# Patient Record
Sex: Female | Born: 1980 | Hispanic: No | Smoking: Never smoker
Health system: Southern US, Community
[De-identification: ages and names within clinical notes are randomized; demographics above are authoritative.]

## PROBLEM LIST (undated history)

## (undated) DIAGNOSIS — M1990 Unspecified osteoarthritis, unspecified site: Secondary | ICD-10-CM

## (undated) DIAGNOSIS — O903 Peripartum cardiomyopathy: Secondary | ICD-10-CM

## (undated) DIAGNOSIS — M549 Dorsalgia, unspecified: Secondary | ICD-10-CM

## (undated) DIAGNOSIS — O139 Gestational [pregnancy-induced] hypertension without significant proteinuria, unspecified trimester: Secondary | ICD-10-CM

## (undated) DIAGNOSIS — G8929 Other chronic pain: Secondary | ICD-10-CM

## (undated) DIAGNOSIS — G43909 Migraine, unspecified, not intractable, without status migrainosus: Secondary | ICD-10-CM

## (undated) DIAGNOSIS — K219 Gastro-esophageal reflux disease without esophagitis: Secondary | ICD-10-CM

## (undated) DIAGNOSIS — R110 Nausea: Secondary | ICD-10-CM

## (undated) DIAGNOSIS — K8020 Calculus of gallbladder without cholecystitis without obstruction: Secondary | ICD-10-CM

## (undated) DIAGNOSIS — Z8669 Personal history of other diseases of the nervous system and sense organs: Secondary | ICD-10-CM

## (undated) DIAGNOSIS — G5601 Carpal tunnel syndrome, right upper limb: Secondary | ICD-10-CM

## (undated) HISTORY — DX: Unspecified osteoarthritis, unspecified site: M19.90

## (undated) HISTORY — DX: Migraine, unspecified, not intractable, without status migrainosus: G43.909

## (undated) HISTORY — DX: Gestational (pregnancy-induced) hypertension without significant proteinuria, unspecified trimester: O13.9

## (undated) HISTORY — DX: Morbid (severe) obesity due to excess calories: E66.01

## (undated) HISTORY — DX: Calculus of gallbladder without cholecystitis without obstruction: K80.20

## (undated) HISTORY — DX: Peripartum cardiomyopathy: O90.3

---
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
## 2001-02-05 ENCOUNTER — Inpatient Hospital Stay (HOSPITAL_COMMUNITY): Admission: AD | Admit: 2001-02-05 | Discharge: 2001-02-08 | Payer: Self-pay | Admitting: *Deleted

## 2001-09-25 ENCOUNTER — Emergency Department (HOSPITAL_COMMUNITY): Admission: EM | Admit: 2001-09-25 | Discharge: 2001-09-25 | Payer: Self-pay | Admitting: Emergency Medicine

## 2003-12-30 ENCOUNTER — Emergency Department: Payer: Self-pay | Admitting: Internal Medicine

## 2003-12-30 ENCOUNTER — Other Ambulatory Visit: Payer: Self-pay

## 2003-12-31 ENCOUNTER — Ambulatory Visit: Payer: Self-pay | Admitting: Internal Medicine

## 2004-07-22 ENCOUNTER — Emergency Department: Payer: Self-pay | Admitting: Unknown Physician Specialty

## 2004-07-22 ENCOUNTER — Other Ambulatory Visit: Payer: Self-pay

## 2004-07-25 ENCOUNTER — Ambulatory Visit: Payer: Self-pay | Admitting: Unknown Physician Specialty

## 2004-09-10 ENCOUNTER — Emergency Department: Payer: Self-pay | Admitting: Emergency Medicine

## 2005-04-13 ENCOUNTER — Observation Stay: Payer: Self-pay

## 2005-04-15 ENCOUNTER — Inpatient Hospital Stay: Payer: Self-pay

## 2005-05-19 ENCOUNTER — Emergency Department (HOSPITAL_COMMUNITY): Admission: EM | Admit: 2005-05-19 | Discharge: 2005-05-20 | Payer: Self-pay | Admitting: Emergency Medicine

## 2006-03-13 ENCOUNTER — Emergency Department (HOSPITAL_COMMUNITY): Admission: EM | Admit: 2006-03-13 | Discharge: 2006-03-14 | Payer: Self-pay | Admitting: Emergency Medicine

## 2006-04-03 ENCOUNTER — Emergency Department (HOSPITAL_COMMUNITY): Admission: EM | Admit: 2006-04-03 | Discharge: 2006-04-03 | Payer: Self-pay | Admitting: Emergency Medicine

## 2006-04-12 ENCOUNTER — Emergency Department (HOSPITAL_COMMUNITY): Admission: EM | Admit: 2006-04-12 | Discharge: 2006-04-12 | Payer: Self-pay | Admitting: Emergency Medicine

## 2006-07-03 ENCOUNTER — Emergency Department (HOSPITAL_COMMUNITY): Admission: EM | Admit: 2006-07-03 | Discharge: 2006-07-04 | Payer: Self-pay | Admitting: Emergency Medicine

## 2006-09-18 ENCOUNTER — Emergency Department (HOSPITAL_COMMUNITY): Admission: EM | Admit: 2006-09-18 | Discharge: 2006-09-18 | Payer: Self-pay | Admitting: *Deleted

## 2006-10-10 ENCOUNTER — Emergency Department (HOSPITAL_COMMUNITY): Admission: EM | Admit: 2006-10-10 | Discharge: 2006-10-10 | Payer: Self-pay | Admitting: Emergency Medicine

## 2007-06-05 ENCOUNTER — Emergency Department (HOSPITAL_COMMUNITY): Admission: EM | Admit: 2007-06-05 | Discharge: 2007-06-06 | Payer: Self-pay | Admitting: Emergency Medicine

## 2008-01-11 ENCOUNTER — Emergency Department (HOSPITAL_COMMUNITY): Admission: EM | Admit: 2008-01-11 | Discharge: 2008-01-12 | Payer: Self-pay | Admitting: Emergency Medicine

## 2008-07-28 ENCOUNTER — Emergency Department (HOSPITAL_COMMUNITY): Admission: EM | Admit: 2008-07-28 | Discharge: 2008-07-28 | Payer: Self-pay | Admitting: Emergency Medicine

## 2008-09-13 ENCOUNTER — Emergency Department (HOSPITAL_COMMUNITY): Admission: EM | Admit: 2008-09-13 | Discharge: 2008-09-14 | Payer: Self-pay | Admitting: Emergency Medicine

## 2008-12-07 ENCOUNTER — Inpatient Hospital Stay (HOSPITAL_COMMUNITY): Admission: AD | Admit: 2008-12-07 | Discharge: 2008-12-07 | Payer: Self-pay | Admitting: Obstetrics & Gynecology

## 2009-01-08 HISTORY — PX: TUBAL LIGATION: SHX77

## 2009-03-18 ENCOUNTER — Ambulatory Visit (HOSPITAL_COMMUNITY): Admission: RE | Admit: 2009-03-18 | Discharge: 2009-03-18 | Payer: Self-pay | Admitting: Obstetrics and Gynecology

## 2009-05-10 ENCOUNTER — Ambulatory Visit (HOSPITAL_COMMUNITY): Admission: RE | Admit: 2009-05-10 | Discharge: 2009-05-10 | Payer: Self-pay | Admitting: Obstetrics and Gynecology

## 2009-05-17 ENCOUNTER — Ambulatory Visit (HOSPITAL_COMMUNITY): Admission: RE | Admit: 2009-05-17 | Discharge: 2009-05-17 | Payer: Self-pay | Admitting: Obstetrics & Gynecology

## 2009-05-24 ENCOUNTER — Ambulatory Visit (HOSPITAL_COMMUNITY): Admission: RE | Admit: 2009-05-24 | Discharge: 2009-05-24 | Payer: Self-pay | Admitting: Obstetrics and Gynecology

## 2009-06-03 ENCOUNTER — Ambulatory Visit (HOSPITAL_COMMUNITY): Admission: RE | Admit: 2009-06-03 | Discharge: 2009-06-03 | Payer: Self-pay | Admitting: Obstetrics and Gynecology

## 2009-06-08 DIAGNOSIS — O903 Peripartum cardiomyopathy: Secondary | ICD-10-CM

## 2009-06-08 HISTORY — DX: Peripartum cardiomyopathy: O90.3

## 2009-06-14 ENCOUNTER — Inpatient Hospital Stay (HOSPITAL_COMMUNITY): Admission: AD | Admit: 2009-06-14 | Discharge: 2009-06-14 | Payer: Self-pay | Admitting: Obstetrics and Gynecology

## 2009-06-14 ENCOUNTER — Ambulatory Visit: Payer: Self-pay | Admitting: Gynecology

## 2009-06-16 ENCOUNTER — Inpatient Hospital Stay (HOSPITAL_COMMUNITY): Admission: AD | Admit: 2009-06-16 | Discharge: 2009-06-16 | Payer: Self-pay | Admitting: Obstetrics and Gynecology

## 2009-06-16 ENCOUNTER — Ambulatory Visit: Payer: Self-pay | Admitting: Gynecology

## 2009-06-23 ENCOUNTER — Inpatient Hospital Stay (HOSPITAL_COMMUNITY): Admission: AD | Admit: 2009-06-23 | Discharge: 2009-06-23 | Payer: Self-pay | Admitting: Obstetrics and Gynecology

## 2009-06-24 ENCOUNTER — Ambulatory Visit: Payer: Self-pay | Admitting: Nurse Practitioner

## 2009-06-24 ENCOUNTER — Inpatient Hospital Stay (HOSPITAL_COMMUNITY): Admission: AD | Admit: 2009-06-24 | Discharge: 2009-06-25 | Payer: Self-pay | Admitting: Obstetrics and Gynecology

## 2009-06-27 ENCOUNTER — Ambulatory Visit: Payer: Self-pay | Admitting: Cardiology

## 2009-06-27 ENCOUNTER — Ambulatory Visit: Payer: Self-pay | Admitting: Pulmonary Disease

## 2009-06-27 ENCOUNTER — Ambulatory Visit: Payer: Self-pay | Admitting: Obstetrics & Gynecology

## 2009-06-27 ENCOUNTER — Inpatient Hospital Stay (HOSPITAL_COMMUNITY): Admission: AD | Admit: 2009-06-27 | Discharge: 2009-07-06 | Payer: Self-pay | Admitting: Obstetrics and Gynecology

## 2009-07-01 ENCOUNTER — Encounter (INDEPENDENT_AMBULATORY_CARE_PROVIDER_SITE_OTHER): Payer: Self-pay | Admitting: Emergency Medicine

## 2009-07-03 ENCOUNTER — Ambulatory Visit: Payer: Self-pay | Admitting: Cardiology

## 2009-07-03 ENCOUNTER — Encounter: Payer: Self-pay | Admitting: Internal Medicine

## 2009-07-14 ENCOUNTER — Ambulatory Visit: Payer: Self-pay | Admitting: Cardiology

## 2009-07-20 ENCOUNTER — Encounter: Payer: Self-pay | Admitting: Physician Assistant

## 2009-07-20 ENCOUNTER — Ambulatory Visit: Payer: Self-pay | Admitting: Internal Medicine

## 2009-07-20 DIAGNOSIS — R609 Edema, unspecified: Secondary | ICD-10-CM

## 2009-07-20 DIAGNOSIS — I10 Essential (primary) hypertension: Secondary | ICD-10-CM

## 2009-07-20 DIAGNOSIS — I429 Cardiomyopathy, unspecified: Secondary | ICD-10-CM | POA: Insufficient documentation

## 2009-07-20 LAB — CONVERTED CEMR LAB
Calcium: 8.8 mg/dL (ref 8.4–10.5)
Creatinine, Ser: 0.7 mg/dL (ref 0.4–1.2)
GFR calc non Af Amer: 122.29 mL/min (ref >60)
Glucose, Bld: 93 mg/dL (ref 70–99)
Potassium: 3.8 meq/L (ref 3.5–5.1)
Sodium: 139 meq/L (ref 135–145)

## 2010-02-07 NOTE — Assessment & Plan Note (Signed)
Summary: eph   Visit Type:  Follow-up Primary Provider:  none  CC:  no complaints.  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 She diuresed in the hospital and was sent home on medications.  The patient is 3 weeks postpartum and is feeling well. She denies dyspnea dyspnea on exertion orthopnea paroxysmal nocturnal dyspnea dizziness or presyncope. She is following her diet closely and has lost 4 more pounds since discharge. She was 346 pounds before delivery was 319 pounds at hospital discharge and is 315 pounds today.  Current Medications (verified): 1)  Carvedilol 12.5 Mg Tabs (Carvedilol) .Marland Kitchen.. 1 Two Times A Day 2)  Lanoxin 0.25 Mg Tabs (Digoxin) .Marland Kitchen.. 1 Once Daily 3)  Enalapril Maleate 10 Mg Tabs (Enalapril Maleate) .Marland Kitchen.. 1 Two Times A Day 4)  Spironolactone 25 Mg Tabs (Spironolactone) .Marland Kitchen.. 1 Once Daily 5)  Furosemide 40 Mg Tabs (Furosemide) .Marland Kitchen.. 1 Once Daily  Allergies (verified): No Known Drug Allergies  Past History:  Past Medical History: Last updated: 07/19/2009  1. She has a history of migraines.   2. History of possible gallstones.   3. History of PIH.      Social History: Reviewed history from 07/19/2009 and no changes required. The patient has two other children.  She is married.   She does not smoke cigarettes or drink alcohol.      Review of Systems       see history of present illness  Vital Signs:  Patient profile:   30 year old female Height:      64 inches Weight:      315 pounds BMI:     54.27 Pulse rate:   89 / minute BP sitting:   123 / 74  (left arm) Cuff size:   large  Vitals Entered By: Burnett Kanaris, CNA (July 20, 2009 11:23  AM)  Physical Exam  General:  Obesre, in no acute distress. Neck: No JVD, HJR, Bruit, or thyroid enlargement Lungs: No tachypnea, clear without wheezing, rales, or rhonchi Cardiovascular: RRR, PMI not displaced, heart sounds normal, no murmurs, gallops, bruit, thrill, or heave. Abdomen: BS normal. Soft without organomegaly, masses, lesions or tenderness. Extremities: trace of edema bilaterally,without cyanosis, clubbing. 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 She is currently compensated and doing well with her medications and diet. I will make no changes. She will see Dr. Franki Cabot back and then have a repeat 2-D echo in several months.recommend continuing 2 g sodium diet, and weight loss. Her updated medication list for this problem includes:    Carvedilol 12.5 Mg Tabs (Carvedilol) .Marland Kitchen... 1 two times a day    Lanoxin 0.25 Mg Tabs (Digoxin) .Marland Kitchen... 1 once daily    Enalapril Maleate 10 Mg Tabs (Enalapril maleate) .Marland Kitchen... 1 two times a day    Spironolactone 25 Mg Tabs (Spironolactone) .Marland Kitchen... 1 once daily    Furosemide 40 Mg Tabs (Furosemide) .Marland KitchenMarland KitchenMarland KitchenMarland Kitchen 1  once daily  Problem # 2:  ESSENTIAL HYPERTENSION, BENIGN (ICD-401.1) blood pressure controlled Her updated medication list for this problem includes:    Carvedilol 12.5 Mg Tabs (Carvedilol) .Marland Kitchen... 1 two times a day    Enalapril Maleate 10 Mg Tabs (Enalapril maleate) .Marland Kitchen... 1 two times a day    Spironolactone 25 Mg Tabs (Spironolactone) .Marland Kitchen... 1 once daily    Furosemide 40 Mg Tabs (Furosemide) .Marland Kitchen... 1 once daily  Orders: EKG w/ Interpretation (93000)  Problem # 3:   EDEMA (ICD-782.3) Patient has trace of mild edema bilaterally. Continue diuretics.  Patient Instructions: 1)  Your physician recommends that you schedule a follow-up appointment in: 3 MONTHS WITH DR [facility] 2)  Your physician recommends that you continue on your current medications as directed. Please refer to the Current Medication list given to you today.

## 2010-02-10 ENCOUNTER — Encounter: Payer: Self-pay | Admitting: Cardiology

## 2010-02-10 ENCOUNTER — Ambulatory Visit (INDEPENDENT_AMBULATORY_CARE_PROVIDER_SITE_OTHER): Payer: Medicaid Other | Admitting: Cardiology

## 2010-02-10 ENCOUNTER — Other Ambulatory Visit: Payer: Self-pay | Admitting: Cardiology

## 2010-02-10 ENCOUNTER — Ambulatory Visit: Admit: 2010-02-10 | Payer: Self-pay | Admitting: Cardiology

## 2010-02-10 DIAGNOSIS — I429 Cardiomyopathy, unspecified: Secondary | ICD-10-CM

## 2010-02-10 DIAGNOSIS — E669 Obesity, unspecified: Secondary | ICD-10-CM

## 2010-02-10 DIAGNOSIS — I10 Essential (primary) hypertension: Secondary | ICD-10-CM

## 2010-02-10 DIAGNOSIS — I5022 Chronic systolic (congestive) heart failure: Secondary | ICD-10-CM

## 2010-02-10 DIAGNOSIS — R609 Edema, unspecified: Secondary | ICD-10-CM

## 2010-02-10 LAB — BASIC METABOLIC PANEL
BUN: 9 mg/dL (ref 6–23)
Calcium: 8.7 mg/dL (ref 8.4–10.5)
Creatinine, Ser: 0.7 mg/dL (ref 0.4–1.2)

## 2010-02-15 NOTE — Assessment & Plan Note (Signed)
Summary: rov/f3/medicaid/mj/per pt call.mj/sp   Vital Signs:  Patient profile:   30 year old female Height:      64 inches Weight:      316 pounds BMI:     54.44 Pulse rate:   93 / minute Resp:     18 per minute BP sitting:   126 / 84  (right arm)  Vitals Entered By: Marrion Coy, CNA (February 10, 2010 10:20 AM)  Visit Type:  Follow-up Primary Provider:  none  CC:  Cardiomyopathy.  History of Present Illness: The patient presents for follow up of her cardiomyopathy. She has missed appointment since July of last year. I met her in the hospital when she had a postpartum cardiomyopathy with an ejection fraction of 20%. She does report that she has been compliant with all of her medications. She says that she will get short of breath after 100 yards on level ground. She does not report resting shortness of breath and she has no PND or orthopnea. She thinks her breathing is much better than it was when she was in the hospital. She denies any chest pressure, neck or arm discomfort. She denies any palpitations, presyncope or syncope. She has no edema.  Current Medications (verified): 1)  Carvedilol 12.5 Mg Tabs (Carvedilol) .Marland Kitchen.. 1 Two Times A Day 2)  Lanoxin 0.25 Mg Tabs (Digoxin) .Marland Kitchen.. 1 Once Daily 3)  Enalapril Maleate 10 Mg Tabs (Enalapril Maleate) .Marland Kitchen.. 1 Two Times A Day 4)  Spironolactone 25 Mg Tabs (Spironolactone) .Marland Kitchen.. 1 Once Daily 5)  Furosemide 40 Mg Tabs (Furosemide) .Marland Kitchen.. 1 Once Daily  Allergies (verified): No Known Drug Allergies  Past History:  Past Medical History:  1. Migraines.   2. Gallstones.   3. PIH.   4. Post partum cardiomyopathy  Past Surgical History: C- Section Tubal ligattion  Review of Systems       As stated in the HPI and negative for all other systems.   Physical Exam  General:  Well developed, well nourished, in no acute distress. Head:  normocephalic and atraumatic Mouth:  Poor dentition. Oral mucosa normal. Neck:  Neck supple, no JVD. No  masses, thyromegaly or abnormal cervical nodes. Chest Wall:  no deformities or breast masses noted Lungs:  Clear bilaterally to auscultation and percussion. Abdomen:  Bowel sounds positive; abdomen soft and non-tender without masses, organomegaly, or hernias noted. No hepatosplenomegaly, obese Msk:  Back normal, normal gait. Muscle strength and tone normal. Pulses:  pulses normal in all 4 extremities Extremities:  No clubbing or cyanosis. Neurologic:  Alert and oriented x 3. Skin:  Intact without lesions or rashes. Cervical Nodes:  no significant adenopathy Psych:  Normal affect.   Detailed Cardiovascular Exam  Neck    Carotids: Carotids full and equal bilaterally without bruits.      Neck Veins: Normal, no JVD.    Heart    Inspection: no deformities or lifts noted.      Palpation: normal PMI with no thrills palpable.      Auscultation: regular rate and rhythm, S1, S2 without murmurs, rubs, gallops, or clicks.    Vascular    Abdominal Aorta: no palpable masses, pulsations, or audible bruits.      Femoral Pulses: normal femoral pulses bilaterally.      Pedal Pulses: normal pedal pulses bilaterally.      Radial Pulses: normal radial pulses bilaterally.      Peripheral Circulation: no clubbing, cyanosis, or edema noted with normal capillary refill.  Impression & Recommendations:  Problem # 1:  CARDIOMYOPATHY, SECONDARY (ICD-425.9) We had a long discussion about this.  I discussed the importance and severity of this diagnosis. We discussed the need for compliance. She expresses understanding. For now I will continue the medications as listed. I will be repeating an echocardiogram. I will get a basic metabolic profile today. Orders: TLB-BMP (Basic Metabolic Panel-BMET) (80048-METABOL) EKG w/ Interpretation (93000) Echocardiogram (Echo)  Problem # 2:  ESSENTIAL HYPERTENSION, BENIGN (ICD-401.1) Her blood pressure is controlled. She will continue the meds as  listed. Orders: TLB-BMP (Basic Metabolic Panel-BMET) (80048-METABOL) EKG w/ Interpretation (93000)  Problem # 3:  OBESITY, MORBID (ICD-278.01) We have a long discussion (greater than 30 minutes counseling) regarding weight loss strategies. This is critical to her well-being long-term.  Patient Instructions: 1)  Your physician recommends that you schedule a follow-up appointment in: 2 months with Dr Antoine Poche 2)  Your physician recommends that you have lab work today  BMP 428.22 3)  Your physician recommends that you continue on your current medications as directed. Please refer to the Current Medication list given to you today. 4)  Your physician has requested that you have an echocardiogram.  Echocardiography is a painless test that uses sound waves to create images of your heart. It provides your doctor with information about the size and shape of your heart and how well your heart's chambers and valves are working.  This procedure takes approximately one hour. There are no restrictions for this procedure.   Orders Added: 1)  TLB-BMP (Basic Metabolic Panel-BMET) [80048-METABOL] 2)  EKG w/ Interpretation [93000] 3)  Echocardiogram [Echo]

## 2010-02-20 ENCOUNTER — Ambulatory Visit (HOSPITAL_COMMUNITY): Payer: Medicaid Other | Attending: Cardiology

## 2010-02-20 DIAGNOSIS — I10 Essential (primary) hypertension: Secondary | ICD-10-CM | POA: Insufficient documentation

## 2010-02-20 DIAGNOSIS — I079 Rheumatic tricuspid valve disease, unspecified: Secondary | ICD-10-CM | POA: Insufficient documentation

## 2010-02-20 DIAGNOSIS — E669 Obesity, unspecified: Secondary | ICD-10-CM | POA: Insufficient documentation

## 2010-02-20 DIAGNOSIS — I379 Nonrheumatic pulmonary valve disorder, unspecified: Secondary | ICD-10-CM | POA: Insufficient documentation

## 2010-02-20 DIAGNOSIS — I428 Other cardiomyopathies: Secondary | ICD-10-CM

## 2010-03-03 ENCOUNTER — Encounter: Payer: Self-pay | Admitting: Cardiology

## 2010-03-03 ENCOUNTER — Encounter (INDEPENDENT_AMBULATORY_CARE_PROVIDER_SITE_OTHER): Payer: Medicaid Other | Admitting: Cardiology

## 2010-03-03 DIAGNOSIS — E669 Obesity, unspecified: Secondary | ICD-10-CM

## 2010-03-03 DIAGNOSIS — I5022 Chronic systolic (congestive) heart failure: Secondary | ICD-10-CM

## 2010-03-04 ENCOUNTER — Encounter: Payer: Self-pay | Admitting: Cardiology

## 2010-03-04 ENCOUNTER — Other Ambulatory Visit: Payer: Self-pay | Admitting: Cardiology

## 2010-03-04 DIAGNOSIS — R5383 Other fatigue: Secondary | ICD-10-CM | POA: Insufficient documentation

## 2010-03-07 NOTE — Assessment & Plan Note (Signed)
Summary: rov. per pam.gd   Visit Type:  Follow-up Primary Provider:  none  CC:  Cardiomyopathy.  History of Present Illness: The patient exams for evaluation of cardiomyopathy. After the last visit I ordered an echocardiogram that demonstrated her ejection fraction was now back to normal.  Since that time the patient has continued to have some dyspnea with exertion. She is trying to do more walking. She is not describing any new PND or orthopnea. She is not describing any new chest pressure, neck or arm discomfort. She has no new palpitations, presyncope or syncope. She is trying to avoid salt.  Current Medications (verified): 1)  Carvedilol 12.5 Mg Tabs (Carvedilol) .Marland Kitchen.. 1 Two Times A Day 2)  Lanoxin 0.25 Mg Tabs (Digoxin) .Marland Kitchen.. 1 Once Daily 3)  Enalapril Maleate 10 Mg Tabs (Enalapril Maleate) .Marland Kitchen.. 1 Two Times A Day 4)  Spironolactone 25 Mg Tabs (Spironolactone) .Marland Kitchen.. 1 Once Daily 5)  Furosemide 40 Mg Tabs (Furosemide) .Marland Kitchen.. 1 Once Daily  Allergies (verified): No Known Drug Allergies  Past History:  Past Medical History: Reviewed history from 02/10/2010 and no changes required.  1. Migraines.   2. Gallstones.   3. PIH.   4. Post partum cardiomyopathy  Past Surgical History: Reviewed history from 02/10/2010 and no changes required. C- Section Tubal ligattion  Review of Systems       As stated in the HPI and negative for all other systems.   Vital Signs:  Patient profile:   30 year old female Height:      64 inches Weight:      309 pounds BMI:     53.23 Pulse rate:   109 / minute BP sitting:   122 / 80  (left arm)  Vitals Entered By: Laurance Flatten CMA (March 03, 2010 10:59 AM)  Physical Exam  General:  Well developed, well nourished, in no acute distress. Head:  normocephalic and atraumatic Mouth:  Poor dentition. Oral mucosa normal. Neck:  Neck supple, no JVD. No masses, thyromegaly or abnormal cervical nodes. Chest Wall:  no deformities or breast masses  noted Lungs:  Clear bilaterally to auscultation and percussion. Abdomen:  Bowel sounds positive; abdomen soft and non-tender without masses, organomegaly, or hernias noted. No hepatosplenomegaly, obese Msk:  Back normal, normal gait. Muscle strength and tone normal. Extremities:  No clubbing or cyanosis. Neurologic:  Alert and oriented x 3. Skin:  Intact without lesions or rashes. Cervical Nodes:  no significant adenopathy Psych:  Normal affect.   Detailed Cardiovascular Exam  Neck    Carotids: Carotids full and equal bilaterally without bruits.      Neck Veins: Normal, no JVD.    Heart    Inspection: no deformities or lifts noted.      Palpation: normal PMI with no thrills palpable.      Auscultation: regular rate and rhythm, S1, S2 without murmurs, rubs, gallops, or clicks.    Vascular    Abdominal Aorta: no palpable masses, pulsations, or audible bruits.      Femoral Pulses: normal femoral pulses bilaterally.      Pedal Pulses: pulses normal in all 4 extremities    Radial Pulses: normal radial pulses bilaterally.      Peripheral Circulation: no clubbing, cyanosis, or edema noted with normal capillary refill.     EKG  Procedure date:  03/03/2010  Findings:      Sinus rhythm, rate 109, axis within normal limits, intervals within normal limits, no acute ST-T wave changes.  Impression & Recommendations:  Problem # 1:  CARDIOMYOPATHY, SECONDARY (ICD-425.9) Her EF is now improved.  She can stop the digoxin and spironolactone.  However, she needs to continue the other meds as  listed.  I will try to get her off of lasix in the future.  Problem # 2:  EDEMA (ICD-782.3) This is improved and, as above, we will continue the lasix.  Problem # 3:  ESSENTIAL HYPERTENSION, BENIGN (ICD-401.1) Her blood pressure is controlled and she will otherwise continue meds with changes as above.  Problem # 4:  OBESITY, MORBID (ICD-278.01) We had a long discussion about [employer] and  [place].  Patient Instructions: 1)  Your physician recommends that you schedule a follow-up appointment in: 4 months with Dr Antoine Poche 2)  Your physician has recommended you make the following change in your medication: Stop Digoxin and Spironolactone

## 2010-03-26 LAB — CBC
HCT: 32.6 % — ABNORMAL LOW (ref 36.0–46.0)
HCT: 34.1 % — ABNORMAL LOW (ref 36.0–46.0)
HCT: 34.7 % — ABNORMAL LOW (ref 36.0–46.0)
HCT: 35.8 % — ABNORMAL LOW (ref 36.0–46.0)
HCT: 36.6 % (ref 36.0–46.0)
HCT: 39.3 % (ref 36.0–46.0)
HCT: 41.2 % (ref 36.0–46.0)
Hemoglobin: 11.1 g/dL — ABNORMAL LOW (ref 12.0–15.0)
Hemoglobin: 11.4 g/dL — ABNORMAL LOW (ref 12.0–15.0)
Hemoglobin: 11.9 g/dL — ABNORMAL LOW (ref 12.0–15.0)
Hemoglobin: 13.2 g/dL (ref 12.0–15.0)
Hemoglobin: 13.6 g/dL (ref 12.0–15.0)
MCH: 29.7 pg (ref 26.0–34.0)
MCH: 29.8 pg (ref 26.0–34.0)
MCHC: 33.1 g/dL (ref 30.0–36.0)
MCHC: 33.5 g/dL (ref 30.0–36.0)
MCHC: 33.7 g/dL (ref 30.0–36.0)
MCHC: 33.8 g/dL (ref 30.0–36.0)
MCHC: 33.9 g/dL (ref 30.0–36.0)
MCHC: 34.2 g/dL (ref 30.0–36.0)
MCV: 86.6 fL (ref 78.0–100.0)
MCV: 86.8 fL (ref 78.0–100.0)
MCV: 87.2 fL (ref 78.0–100.0)
MCV: 87.3 fL (ref 78.0–100.0)
MCV: 87.4 fL (ref 78.0–100.0)
MCV: 87.5 fL (ref 78.0–100.0)
MCV: 87.7 fL (ref 78.0–100.0)
Platelets: 188 10*3/uL (ref 150–400)
Platelets: 192 10*3/uL (ref 150–400)
Platelets: 194 10*3/uL (ref 150–400)
Platelets: 232 10*3/uL (ref 150–400)
Platelets: 279 10*3/uL (ref 150–400)
RBC: 3.73 MIL/uL — ABNORMAL LOW (ref 3.87–5.11)
RBC: 3.89 MIL/uL (ref 3.87–5.11)
RBC: 3.98 MIL/uL (ref 3.87–5.11)
RBC: 4.13 MIL/uL (ref 3.87–5.11)
RBC: 4.19 MIL/uL (ref 3.87–5.11)
RBC: 4.54 MIL/uL (ref 3.87–5.11)
RBC: 4.71 MIL/uL (ref 3.87–5.11)
RDW: 15.2 % (ref 11.5–15.5)
RDW: 15.2 % (ref 11.5–15.5)
RDW: 16 % — ABNORMAL HIGH (ref 11.5–15.5)
RDW: 16.1 % — ABNORMAL HIGH (ref 11.5–15.5)
RDW: 16.3 % — ABNORMAL HIGH (ref 11.5–15.5)
RDW: 16.4 % — ABNORMAL HIGH (ref 11.5–15.5)
WBC: 12.1 10*3/uL — ABNORMAL HIGH (ref 4.0–10.5)
WBC: 14 10*3/uL — ABNORMAL HIGH (ref 4.0–10.5)
WBC: 14.3 10*3/uL — ABNORMAL HIGH (ref 4.0–10.5)
WBC: 14.5 10*3/uL — ABNORMAL HIGH (ref 4.0–10.5)
WBC: 16.8 10*3/uL — ABNORMAL HIGH (ref 4.0–10.5)
WBC: 17.9 10*3/uL — ABNORMAL HIGH (ref 4.0–10.5)
WBC: 18.3 10*3/uL — ABNORMAL HIGH (ref 4.0–10.5)

## 2010-03-26 LAB — URINE MICROSCOPIC-ADD ON: WBC, UA: NONE SEEN WBC/hpf (ref <3)

## 2010-03-26 LAB — COMPREHENSIVE METABOLIC PANEL
ALT: 22 U/L (ref 0–35)
ALT: 38 U/L — ABNORMAL HIGH (ref 0–35)
AST: 33 U/L (ref 0–37)
AST: 33 U/L (ref 0–37)
Albumin: 2.1 g/dL — ABNORMAL LOW (ref 3.5–5.2)
Albumin: 2.5 g/dL — ABNORMAL LOW (ref 3.5–5.2)
Albumin: 2.6 g/dL — ABNORMAL LOW (ref 3.5–5.2)
Albumin: 2.6 g/dL — ABNORMAL LOW (ref 3.5–5.2)
Alkaline Phosphatase: 121 U/L — ABNORMAL HIGH (ref 39–117)
Alkaline Phosphatase: 83 U/L (ref 39–117)
Alkaline Phosphatase: 95 U/L (ref 39–117)
BUN: 11 mg/dL (ref 6–23)
BUN: 3 mg/dL — ABNORMAL LOW (ref 6–23)
BUN: 3 mg/dL — ABNORMAL LOW (ref 6–23)
BUN: 4 mg/dL — ABNORMAL LOW (ref 6–23)
BUN: 6 mg/dL (ref 6–23)
CO2: 21 mEq/L (ref 19–32)
CO2: 22 mEq/L (ref 19–32)
CO2: 23 mEq/L (ref 19–32)
CO2: 24 mEq/L (ref 19–32)
Calcium: 8.2 mg/dL — ABNORMAL LOW (ref 8.4–10.5)
Calcium: 8.4 mg/dL (ref 8.4–10.5)
Calcium: 9 mg/dL (ref 8.4–10.5)
Chloride: 105 mEq/L (ref 96–112)
Chloride: 106 mEq/L (ref 96–112)
Chloride: 106 mEq/L (ref 96–112)
Chloride: 109 mEq/L (ref 96–112)
Creatinine, Ser: 0.68 mg/dL (ref 0.4–1.2)
Creatinine, Ser: 0.74 mg/dL (ref 0.4–1.2)
Creatinine, Ser: 0.75 mg/dL (ref 0.4–1.2)
Creatinine, Ser: 0.76 mg/dL (ref 0.4–1.2)
Creatinine, Ser: 1.03 mg/dL (ref 0.4–1.2)
GFR calc Af Amer: >60 mL/min (ref >60)
GFR calc Af Amer: >60 mL/min (ref >60)
GFR calc non Af Amer: >60 mL/min (ref >60)
GFR calc non Af Amer: >60 mL/min (ref >60)
GFR calc non Af Amer: >60 mL/min (ref >60)
GFR calc non Af Amer: >60 mL/min (ref >60)
Glucose, Bld: 100 mg/dL — ABNORMAL HIGH (ref 70–99)
Glucose, Bld: 83 mg/dL (ref 70–99)
Glucose, Bld: 95 mg/dL (ref 70–99)
Potassium: 3.1 mEq/L — ABNORMAL LOW (ref 3.5–5.1)
Potassium: 3.2 mEq/L — ABNORMAL LOW (ref 3.5–5.1)
Potassium: 3.3 mEq/L — ABNORMAL LOW (ref 3.5–5.1)
Potassium: 3.3 mEq/L — ABNORMAL LOW (ref 3.5–5.1)
Sodium: 134 mEq/L — ABNORMAL LOW (ref 135–145)
Sodium: 134 mEq/L — ABNORMAL LOW (ref 135–145)
Sodium: 134 mEq/L — ABNORMAL LOW (ref 135–145)
Total Bilirubin: 0.3 mg/dL (ref 0.3–1.2)
Total Bilirubin: 0.3 mg/dL (ref 0.3–1.2)
Total Bilirubin: 0.3 mg/dL (ref 0.3–1.2)
Total Bilirubin: 0.5 mg/dL (ref 0.3–1.2)
Total Protein: 5.3 g/dL — ABNORMAL LOW (ref 6.0–8.3)
Total Protein: 5.8 g/dL — ABNORMAL LOW (ref 6.0–8.3)
Total Protein: 6.1 g/dL (ref 6.0–8.3)
Total Protein: 6.3 g/dL (ref 6.0–8.3)

## 2010-03-26 LAB — BLOOD GAS, ARTERIAL
Acid-base deficit: 3.2 mmol/L — ABNORMAL HIGH (ref 0.0–2.0)
Bicarbonate: 18.5 mEq/L — ABNORMAL LOW (ref 20.0–24.0)
Drawn by: 144261
FIO2: 1 %
O2 Content: 15 L/min
O2 Saturation: 97 %
TCO2: 19.3 mmol/L (ref 0–100)
pCO2 arterial: 26 mmHg — ABNORMAL LOW (ref 35.0–45.0)
pH, Arterial: 7.466 — ABNORMAL HIGH (ref 7.350–7.400)
pO2, Arterial: 116 mmHg — ABNORMAL HIGH (ref 80.0–100.0)

## 2010-03-26 LAB — BRAIN NATRIURETIC PEPTIDE
Pro B Natriuretic peptide (BNP): 557 pg/mL — ABNORMAL HIGH (ref 0.0–100.0)
Pro B Natriuretic peptide (BNP): 729 pg/mL — ABNORMAL HIGH (ref 0.0–100.0)

## 2010-03-26 LAB — DIFFERENTIAL
Basophils Absolute: 0.1 10*3/uL (ref 0.0–0.1)
Basophils Relative: 0 % (ref 0–1)
Lymphocytes Relative: 21 % (ref 12–46)
Monocytes Absolute: 0.7 10*3/uL (ref 0.1–1.0)
Neutro Abs: 10.5 10*3/uL — ABNORMAL HIGH (ref 1.7–7.7)
Neutrophils Relative %: 72 % (ref 43–77)

## 2010-03-26 LAB — URINALYSIS, ROUTINE W REFLEX MICROSCOPIC
Bilirubin Urine: NEGATIVE
Bilirubin Urine: NEGATIVE
Glucose, UA: NEGATIVE mg/dL
Glucose, UA: NEGATIVE mg/dL
Ketones, ur: NEGATIVE mg/dL
Ketones, ur: NEGATIVE mg/dL
Leukocytes, UA: NEGATIVE
Leukocytes, UA: NEGATIVE
Nitrite: NEGATIVE
Nitrite: NEGATIVE
Protein, ur: 30 mg/dL — AB
Protein, ur: 30 mg/dL — AB
Specific Gravity, Urine: >1.03 — ABNORMAL HIGH (ref 1.005–1.030)
Specific Gravity, Urine: >1.03 — ABNORMAL HIGH (ref 1.005–1.030)
Urobilinogen, UA: 0.2 mg/dL (ref 0.0–1.0)
Urobilinogen, UA: 1 mg/dL (ref 0.0–1.0)
pH: 6 (ref 5.0–8.0)
pH: 6 (ref 5.0–8.0)

## 2010-03-26 LAB — BASIC METABOLIC PANEL
BUN: 8 mg/dL (ref 6–23)
BUN: 8 mg/dL (ref 6–23)
BUN: 9 mg/dL (ref 6–23)
BUN: 9 mg/dL (ref 6–23)
BUN: 9 mg/dL (ref 6–23)
CO2: 23 mEq/L (ref 19–32)
CO2: 26 mEq/L (ref 19–32)
CO2: 27 mEq/L (ref 19–32)
Calcium: 8.2 mg/dL — ABNORMAL LOW (ref 8.4–10.5)
Calcium: 8.6 mg/dL (ref 8.4–10.5)
Calcium: 8.7 mg/dL (ref 8.4–10.5)
Calcium: 8.8 mg/dL (ref 8.4–10.5)
Chloride: 101 mEq/L (ref 96–112)
Chloride: 101 mEq/L (ref 96–112)
Chloride: 104 mEq/L (ref 96–112)
Chloride: 106 mEq/L (ref 96–112)
Creatinine, Ser: 0.74 mg/dL (ref 0.4–1.2)
Creatinine, Ser: 0.81 mg/dL (ref 0.4–1.2)
GFR calc Af Amer: >60 mL/min (ref >60)
GFR calc Af Amer: >60 mL/min (ref >60)
GFR calc Af Amer: >60 mL/min (ref >60)
GFR calc non Af Amer: >60 mL/min (ref >60)
GFR calc non Af Amer: >60 mL/min (ref >60)
GFR calc non Af Amer: >60 mL/min (ref >60)
GFR calc non Af Amer: >60 mL/min (ref >60)
GFR calc non Af Amer: >60 mL/min (ref >60)
Glucose, Bld: 102 mg/dL — ABNORMAL HIGH (ref 70–99)
Glucose, Bld: 91 mg/dL (ref 70–99)
Glucose, Bld: 97 mg/dL (ref 70–99)
Potassium: 3.5 mEq/L (ref 3.5–5.1)
Potassium: 3.5 mEq/L (ref 3.5–5.1)
Potassium: 3.5 mEq/L (ref 3.5–5.1)
Potassium: 3.5 mEq/L (ref 3.5–5.1)
Potassium: 3.6 mEq/L (ref 3.5–5.1)
Potassium: 3.6 mEq/L (ref 3.5–5.1)
Potassium: 3.8 mEq/L (ref 3.5–5.1)
Sodium: 135 mEq/L (ref 135–145)
Sodium: 135 mEq/L (ref 135–145)
Sodium: 140 mEq/L (ref 135–145)

## 2010-03-26 LAB — URIC ACID
Uric Acid, Serum: 6.1 mg/dL (ref 2.4–7.0)
Uric Acid, Serum: 6.7 mg/dL (ref 2.4–7.0)
Uric Acid, Serum: 6.7 mg/dL (ref 2.4–7.0)

## 2010-03-26 LAB — TSH: TSH: 3.497 u[IU]/mL (ref 0.350–4.500)

## 2010-03-26 LAB — LACTATE DEHYDROGENASE
LDH: 163 U/L (ref 94–250)
LDH: 208 U/L (ref 94–250)
LDH: 230 U/L (ref 94–250)

## 2010-03-26 LAB — URINE CULTURE: Colony Count: 70000

## 2010-03-26 LAB — CARBOXYHEMOGLOBIN
Carboxyhemoglobin: 1.6 % — ABNORMAL HIGH (ref 0.5–1.5)
Methemoglobin: 1.1 % (ref 0.0–1.5)
O2 Saturation: 66.7 %
Total hemoglobin: 10.9 g/dL — ABNORMAL LOW (ref 12.5–16.0)

## 2010-03-26 LAB — GLUCOSE, CAPILLARY: Glucose-Capillary: 115 mg/dL — ABNORMAL HIGH (ref 70–99)

## 2010-03-26 LAB — MAGNESIUM
Magnesium: 2.1 mg/dL (ref 1.5–2.5)
Magnesium: 2.6 mg/dL — ABNORMAL HIGH (ref 1.5–2.5)

## 2010-03-26 LAB — MRSA PCR SCREENING: MRSA by PCR: NEGATIVE

## 2010-03-26 LAB — RPR: RPR Ser Ql: NONREACTIVE

## 2010-03-27 LAB — COMPREHENSIVE METABOLIC PANEL
ALT: 12 U/L (ref 0–35)
AST: 20 U/L (ref 0–37)
CO2: 23 mEq/L (ref 19–32)
Chloride: 109 mEq/L (ref 96–112)
GFR calc Af Amer: >60 mL/min (ref >60)
GFR calc non Af Amer: >60 mL/min (ref >60)
Glucose, Bld: 93 mg/dL (ref 70–99)
Sodium: 137 mEq/L (ref 135–145)
Total Bilirubin: 0.2 mg/dL — ABNORMAL LOW (ref 0.3–1.2)

## 2010-03-27 LAB — URINALYSIS, ROUTINE W REFLEX MICROSCOPIC
Bilirubin Urine: NEGATIVE
Glucose, UA: NEGATIVE mg/dL
Ketones, ur: 15 mg/dL — AB
Ketones, ur: NEGATIVE mg/dL
Leukocytes, UA: NEGATIVE
Nitrite: NEGATIVE
Protein, ur: 30 mg/dL — AB
Specific Gravity, Urine: 1.02 (ref 1.005–1.030)
pH: 6.5 (ref 5.0–8.0)

## 2010-03-27 LAB — URINE MICROSCOPIC-ADD ON

## 2010-03-27 LAB — CBC
Hemoglobin: 12.2 g/dL (ref 12.0–15.0)
MCV: 87.2 fL (ref 78.0–100.0)
RBC: 4.09 MIL/uL (ref 3.87–5.11)
WBC: 16.3 10*3/uL — ABNORMAL HIGH (ref 4.0–10.5)

## 2010-03-27 LAB — LACTATE DEHYDROGENASE: LDH: 158 U/L (ref 94–250)

## 2010-03-28 NOTE — Progress Notes (Signed)
This encounter was created in error - please disregard.

## 2010-04-12 LAB — URINALYSIS, ROUTINE W REFLEX MICROSCOPIC
Bilirubin Urine: NEGATIVE
Specific Gravity, Urine: 1.01 (ref 1.005–1.030)
Urobilinogen, UA: 0.2 mg/dL (ref 0.0–1.0)

## 2010-04-12 LAB — URINE MICROSCOPIC-ADD ON

## 2010-04-16 LAB — COMPREHENSIVE METABOLIC PANEL
ALT: 19 U/L (ref 0–35)
AST: 36 U/L (ref 0–37)
Alkaline Phosphatase: 64 U/L (ref 39–117)
CO2: 24 mEq/L (ref 19–32)
Chloride: 107 mEq/L (ref 96–112)
Creatinine, Ser: 0.64 mg/dL (ref 0.4–1.2)
GFR calc Af Amer: >60 mL/min (ref >60)
GFR calc non Af Amer: >60 mL/min (ref >60)
Sodium: 138 mEq/L (ref 135–145)
Total Bilirubin: 0.5 mg/dL (ref 0.3–1.2)

## 2010-04-16 LAB — DIFFERENTIAL
Basophils Absolute: 0.1 10*3/uL (ref 0.0–0.1)
Eosinophils Absolute: 0.2 10*3/uL (ref 0.0–0.7)
Eosinophils Relative: 1 % (ref 0–5)

## 2010-04-16 LAB — URINE MICROSCOPIC-ADD ON

## 2010-04-16 LAB — URINALYSIS, ROUTINE W REFLEX MICROSCOPIC
Glucose, UA: NEGATIVE mg/dL
Leukocytes, UA: NEGATIVE
Protein, ur: NEGATIVE mg/dL
pH: 7 (ref 5.0–8.0)

## 2010-04-16 LAB — LIPASE, BLOOD: Lipase: 36 U/L (ref 11–59)

## 2010-04-16 LAB — CBC
MCV: 85.6 fL (ref 78.0–100.0)
RBC: 4.46 MIL/uL (ref 3.87–5.11)
WBC: 14.1 10*3/uL — ABNORMAL HIGH (ref 4.0–10.5)

## 2010-04-18 ENCOUNTER — Other Ambulatory Visit: Payer: Self-pay | Admitting: *Deleted

## 2010-04-18 MED ORDER — CARVEDILOL 12.5 MG PO TABS: 12.5000 mg | ORAL_TABLET | Freq: Two times a day (BID) | ORAL | Status: DC

## 2010-04-18 MED ORDER — FUROSEMIDE 40 MG PO TABS: 40.0000 mg | ORAL_TABLET | Freq: Every day | ORAL | Status: DC

## 2010-04-18 MED ORDER — ENALAPRIL MALEATE 10 MG PO TABS: 10.0000 mg | ORAL_TABLET | Freq: Every day | ORAL | Status: DC

## 2010-06-13 ENCOUNTER — Encounter: Payer: Self-pay | Admitting: Cardiology

## 2010-07-06 ENCOUNTER — Ambulatory Visit: Payer: Medicaid Other | Admitting: Cardiology

## 2010-07-14 ENCOUNTER — Encounter: Payer: Self-pay | Admitting: Cardiology

## 2010-10-25 LAB — I-STAT 8, (EC8 V) (CONVERTED LAB)
Bicarbonate: 23.7
Glucose, Bld: 98
Sodium: 140
TCO2: 25
pCO2, Ven: 33.9 — ABNORMAL LOW
pH, Ven: 7.453 — ABNORMAL HIGH

## 2010-10-25 LAB — POCT CARDIAC MARKERS
CKMB, poc: <1 — ABNORMAL LOW
Myoglobin, poc: 31.1
Myoglobin, poc: 38.5
Operator id: 270651
Troponin i, poc: <0.05

## 2010-10-25 LAB — DIFFERENTIAL
Basophils Absolute: 0.1
Basophils Relative: 1
Eosinophils Absolute: 0.2
Monocytes Absolute: 0.7
Neutro Abs: 7.2

## 2010-10-25 LAB — CBC
Hemoglobin: 12.6
MCHC: 33.3
RDW: 15.1 — ABNORMAL HIGH

## 2010-11-30 ENCOUNTER — Emergency Department (HOSPITAL_COMMUNITY): Payer: Self-pay

## 2010-11-30 ENCOUNTER — Emergency Department (HOSPITAL_COMMUNITY)
Admission: EM | Admit: 2010-11-30 | Discharge: 2010-11-30 | Disposition: A | Discharge status: Home / Self Care | Payer: Self-pay | Attending: Emergency Medicine | Admitting: Emergency Medicine

## 2010-11-30 ENCOUNTER — Encounter (HOSPITAL_COMMUNITY): Payer: Self-pay | Admitting: *Deleted

## 2010-11-30 DIAGNOSIS — K8020 Calculus of gallbladder without cholecystitis without obstruction: Secondary | ICD-10-CM | POA: Insufficient documentation

## 2010-11-30 DIAGNOSIS — Z79899 Other long term (current) drug therapy: Secondary | ICD-10-CM | POA: Insufficient documentation

## 2010-11-30 DIAGNOSIS — R110 Nausea: Secondary | ICD-10-CM | POA: Insufficient documentation

## 2010-11-30 DIAGNOSIS — R109 Unspecified abdominal pain: Secondary | ICD-10-CM | POA: Insufficient documentation

## 2010-11-30 LAB — COMPREHENSIVE METABOLIC PANEL
BUN: 8 mg/dL (ref 6–23)
CO2: 25 mEq/L (ref 19–32)
Calcium: 9.5 mg/dL (ref 8.4–10.5)
Creatinine, Ser: 0.87 mg/dL (ref 0.50–1.10)
GFR calc Af Amer: >90 mL/min (ref >90)
GFR calc non Af Amer: 89 mL/min — ABNORMAL LOW (ref >90)
Glucose, Bld: 100 mg/dL — ABNORMAL HIGH (ref 70–99)
Total Protein: 7.3 g/dL (ref 6.0–8.3)

## 2010-11-30 LAB — URINALYSIS, ROUTINE W REFLEX MICROSCOPIC
Bilirubin Urine: NEGATIVE
Ketones, ur: NEGATIVE mg/dL
Specific Gravity, Urine: 1.016 (ref 1.005–1.030)
Urobilinogen, UA: 0.2 mg/dL (ref 0.0–1.0)
pH: 7.5 (ref 5.0–8.0)

## 2010-11-30 LAB — CBC
Hemoglobin: 11.7 g/dL — ABNORMAL LOW (ref 12.0–15.0)
MCHC: 33.2 g/dL (ref 30.0–36.0)
Platelets: 379 10*3/uL (ref 150–400)
RBC: 4.23 MIL/uL (ref 3.87–5.11)

## 2010-11-30 LAB — LIPASE, BLOOD: Lipase: 56 U/L (ref 11–59)

## 2010-11-30 LAB — POCT PREGNANCY, URINE: Preg Test, Ur: NEGATIVE

## 2010-11-30 MED ORDER — PROMETHAZINE HCL 25 MG PO TABS: 25.0000 mg | ORAL_TABLET | Freq: Four times a day (QID) | ORAL | Status: DC | PRN

## 2010-11-30 MED ORDER — HYDROMORPHONE HCL PF 1 MG/ML IJ SOLN
1.0000 mg | Freq: Once | INTRAMUSCULAR | Status: AC
  Administered 2010-11-30: 1 mg via INTRAVENOUS
  Filled 2010-11-30: qty 1

## 2010-11-30 MED ORDER — OXYCODONE-ACETAMINOPHEN 5-325 MG PO TABS: 1.0000 | ORAL_TABLET | ORAL | Status: AC | PRN

## 2010-11-30 MED ORDER — SODIUM CHLORIDE 0.9 % IV SOLN
999.0000 mL | Freq: Once | INTRAVENOUS | Status: AC
  Administered 2010-11-30: 125 mL via INTRAVENOUS
  Administered 2010-11-30: 1000 mL via INTRAVENOUS

## 2010-11-30 MED ORDER — HYDROMORPHONE HCL PF 1 MG/ML IJ SOLN
2.0000 mg | Freq: Once | INTRAMUSCULAR | Status: AC
  Administered 2010-11-30: 2 mg via INTRAVENOUS
  Filled 2010-11-30: qty 2

## 2010-11-30 NOTE — ED Provider Notes (Signed)
History     CSN: [id number] Arrival date & time: 11/30/2010 12:07 PM   First MD Initiated Contact with Patient 11/30/10 1207      Chief Complaint  Patient presents with  . Abdominal Pain    (Consider location/radiation/quality/duration/timing/severity/associated sxs/prior treatment) HPI Complains of epigastric pain radiating to both upper quadrants onset 3 AM today feels like gallbladder pain she's had in the past admits to nausea no vomiting treated with ibuprofen without relief. No fever last bowel movement last night normal last menstrual period approximately 3 weeks ago, normal. Pain is severe constant. No fever no other associated symptoms. Nausea has resolved. Only complains of pain at present Past Medical History  Diagnosis Date  . Migraines   . Gallstones   . PIH (pregnancy induced hypertension)   . Cardiomyopathy, peripartum, postpartum   . Morbid obesity     Past Surgical History  Procedure Date  . Cesarean section   . Tubal ligation     Family History  Problem Relation Age of Onset  . Hypertension Mother   . Heart disease Neg Hx     noncontributory for early CAD    History  Substance Use Topics  . Smoking status: Never Smoker   . Smokeless tobacco: Not on file  . Alcohol Use: No    OB History    Grav Para Term Preterm Abortions TAB SAB Ect Mult Living                  Review of Systems  Constitutional: Negative.   HENT: Negative.   Respiratory: Negative.   Cardiovascular: Negative.   Gastrointestinal: Positive for nausea and abdominal pain.  Musculoskeletal: Negative.   Skin: Negative.   Neurological: Negative.   Hematological: Negative.   Psychiatric/Behavioral: Negative.   All other systems reviewed and are negative.    Allergies  Review of patient's allergies indicates no known allergies.  Home Medications   Current Outpatient Rx  Name Route Sig Dispense Refill  . CARVEDILOL 12.5 MG PO TABS Oral Take 12.5 mg by mouth 2 (two)  times daily with a meal.      . ENALAPRIL MALEATE 10 MG PO TABS Oral Take 10 mg by mouth daily.      . FUROSEMIDE 40 MG PO TABS Oral Take 40 mg by mouth daily.      . IBUPROFEN 200 MG PO TABS Oral Take 400 mg by mouth every 4 (four) hours as needed. For pain       BP 152/88  Pulse 97  Temp(Src) 97.8 F (36.6 C) (Oral)  Resp 22  SpO2 100%  LMP 10/30/2010  Physical Exam  Nursing note and vitals reviewed. Constitutional: She appears well-developed and well-nourished.  HENT:  Head: Normocephalic and atraumatic.  Eyes: Conjunctivae are normal. Pupils are equal, round, and reactive to light.  Neck: Neck supple. No tracheal deviation present. No thyromegaly present.  Cardiovascular: Normal rate and regular rhythm.   No murmur heard. Pulmonary/Chest: Effort normal and breath sounds normal.  Abdominal: Soft. Bowel sounds are normal. She exhibits no distension. There is tenderness. There is no rebound and no guarding.       Morbidly obese tender at right upper quadrant, epigastric area and left upper quadrant  Musculoskeletal: Normal range of motion. She exhibits no edema and no tenderness.  Neurological: She is alert. Coordination normal.  Skin: Skin is warm and dry. No rash noted.  Psychiatric: She has a normal mood and affect.    ED Course  Procedures (  including critical care time)   Labs Reviewed  POCT PREGNANCY, URINE  COMPREHENSIVE METABOLIC PANEL  LIPASE, BLOOD  POCT PREGNANCY, URINE  CBC   No results found.   No diagnosis found.    MDM  Plan : Symptom  control . Move to CDU Awaiting diagnostic results and disposition Diagnosis:Abdominal pain       Doug Sou, MD 12/01/10 [phone]

## 2010-11-30 NOTE — ED Notes (Signed)
Patient states she was awakened at 300am with severe abd. Pain, had episode of vomiting last pm. States she was dx. With gallstones 10 yrs ago however had surgery.

## 2010-11-30 NOTE — ED Provider Notes (Signed)
  Physical Exam  BP 150/80  Pulse 80  Temp(Src) 97.8 F (36.6 C) (Oral)  Resp 16  SpO2 100%  LMP 10/30/2010  Physical Exam  ED Course  Procedures  MDM US shows cholelithiasis w/out cholecystitis.   Results discussed w/ pt.  I strongly recommended that she follow up with general surgery d/t frequency of exacerbations and severity of pain currently.   She requests one more dose of pain medication.  Will d/c home if she is tolerating pos.  Prescribed percocet and promethazine.  Return precautions discussed.       Otilio Miu, [place] 11/30/10 1630

## 2010-11-30 NOTE — ED Notes (Signed)
Pt is in ultrasound. Will come to cdu when done

## 2010-12-01 NOTE — ED Provider Notes (Signed)
Medical screening examination/treatment/procedure(s) were conducted as a shared visit with non-physician practitioner(s) and myself.  I personally evaluated the patient during the encounter  Doug Sou, MD 12/01/10 [phone]

## 2011-03-06 IMAGING — CR DG CHEST 2V
2 series · 2 of 2 positions shown · non-contrast
Comparison: Two-view chest x-ray yesterday and 06/25/2009.

CLINICAL DATA: Follow up pulmonary edema.  Preeclampsia.  Shortness
of breath.  Cesarean section 2 days ago.

CHEST - 2 VIEW 06/30/2009:

[view not recorded (1 of 2)]
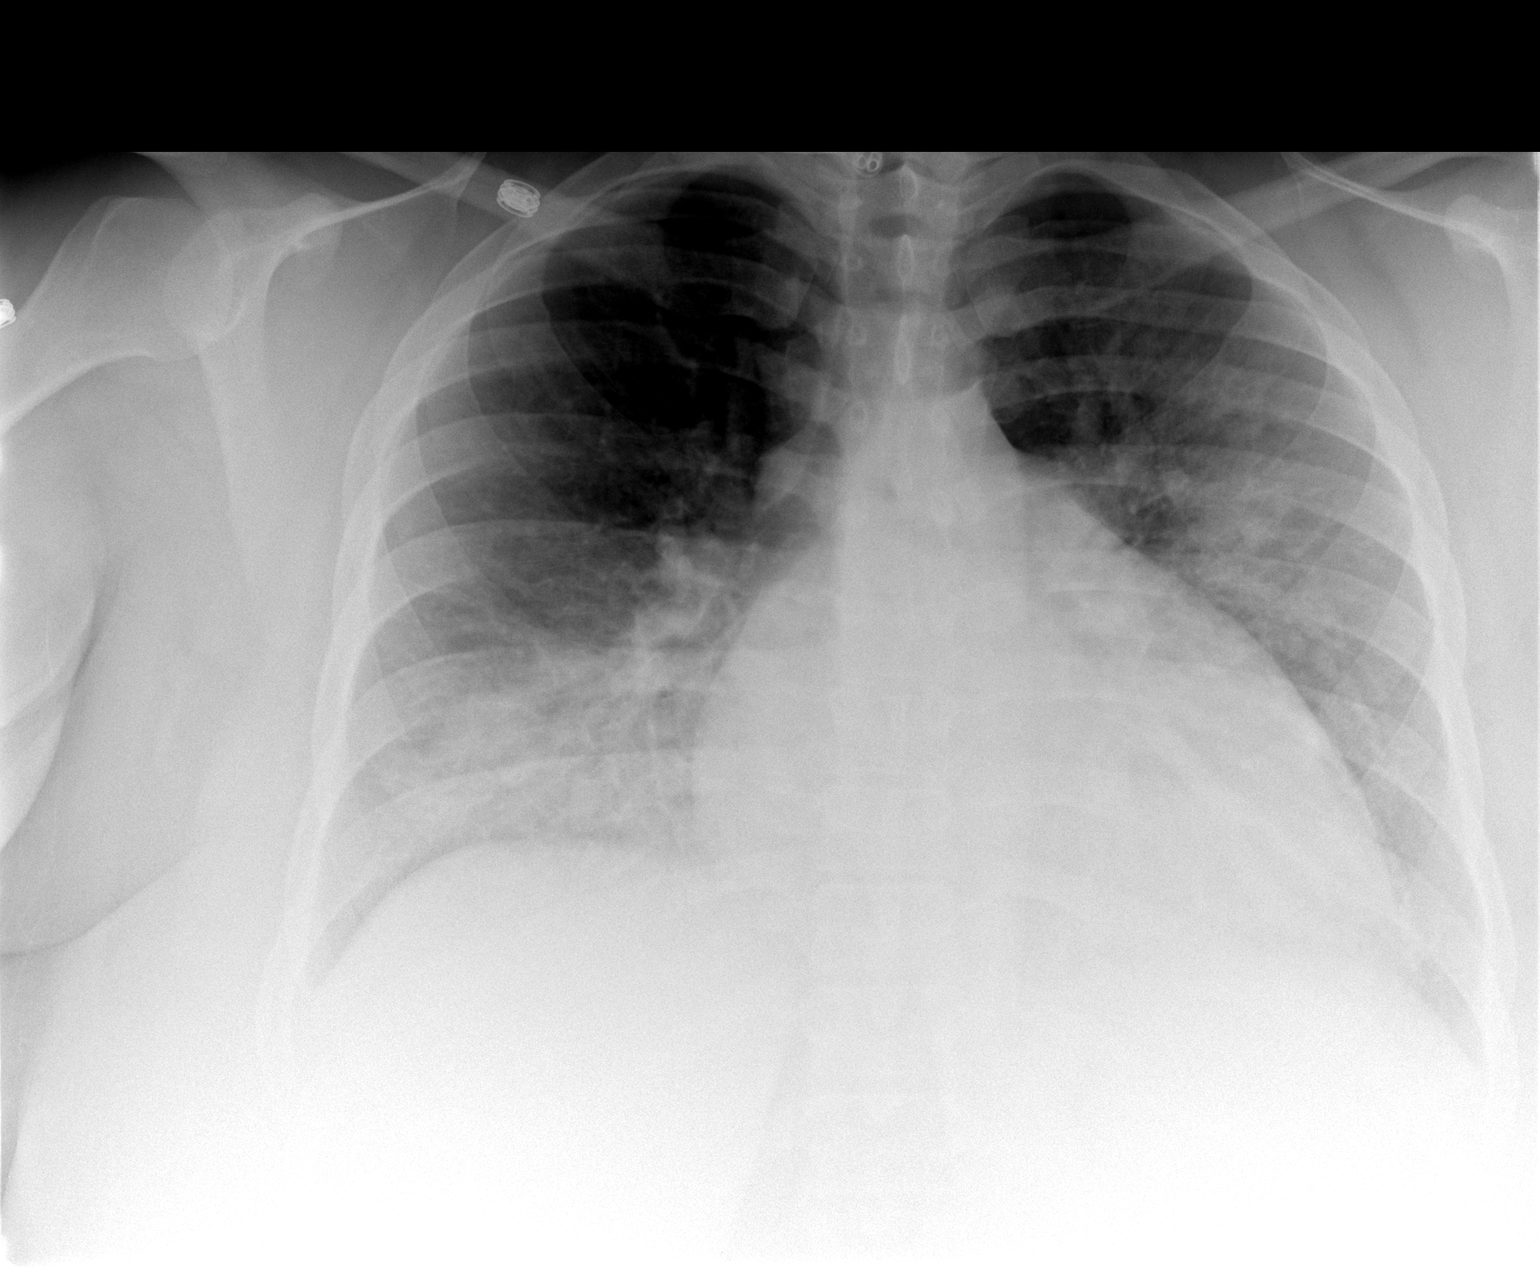

[view not recorded (2 of 2)]
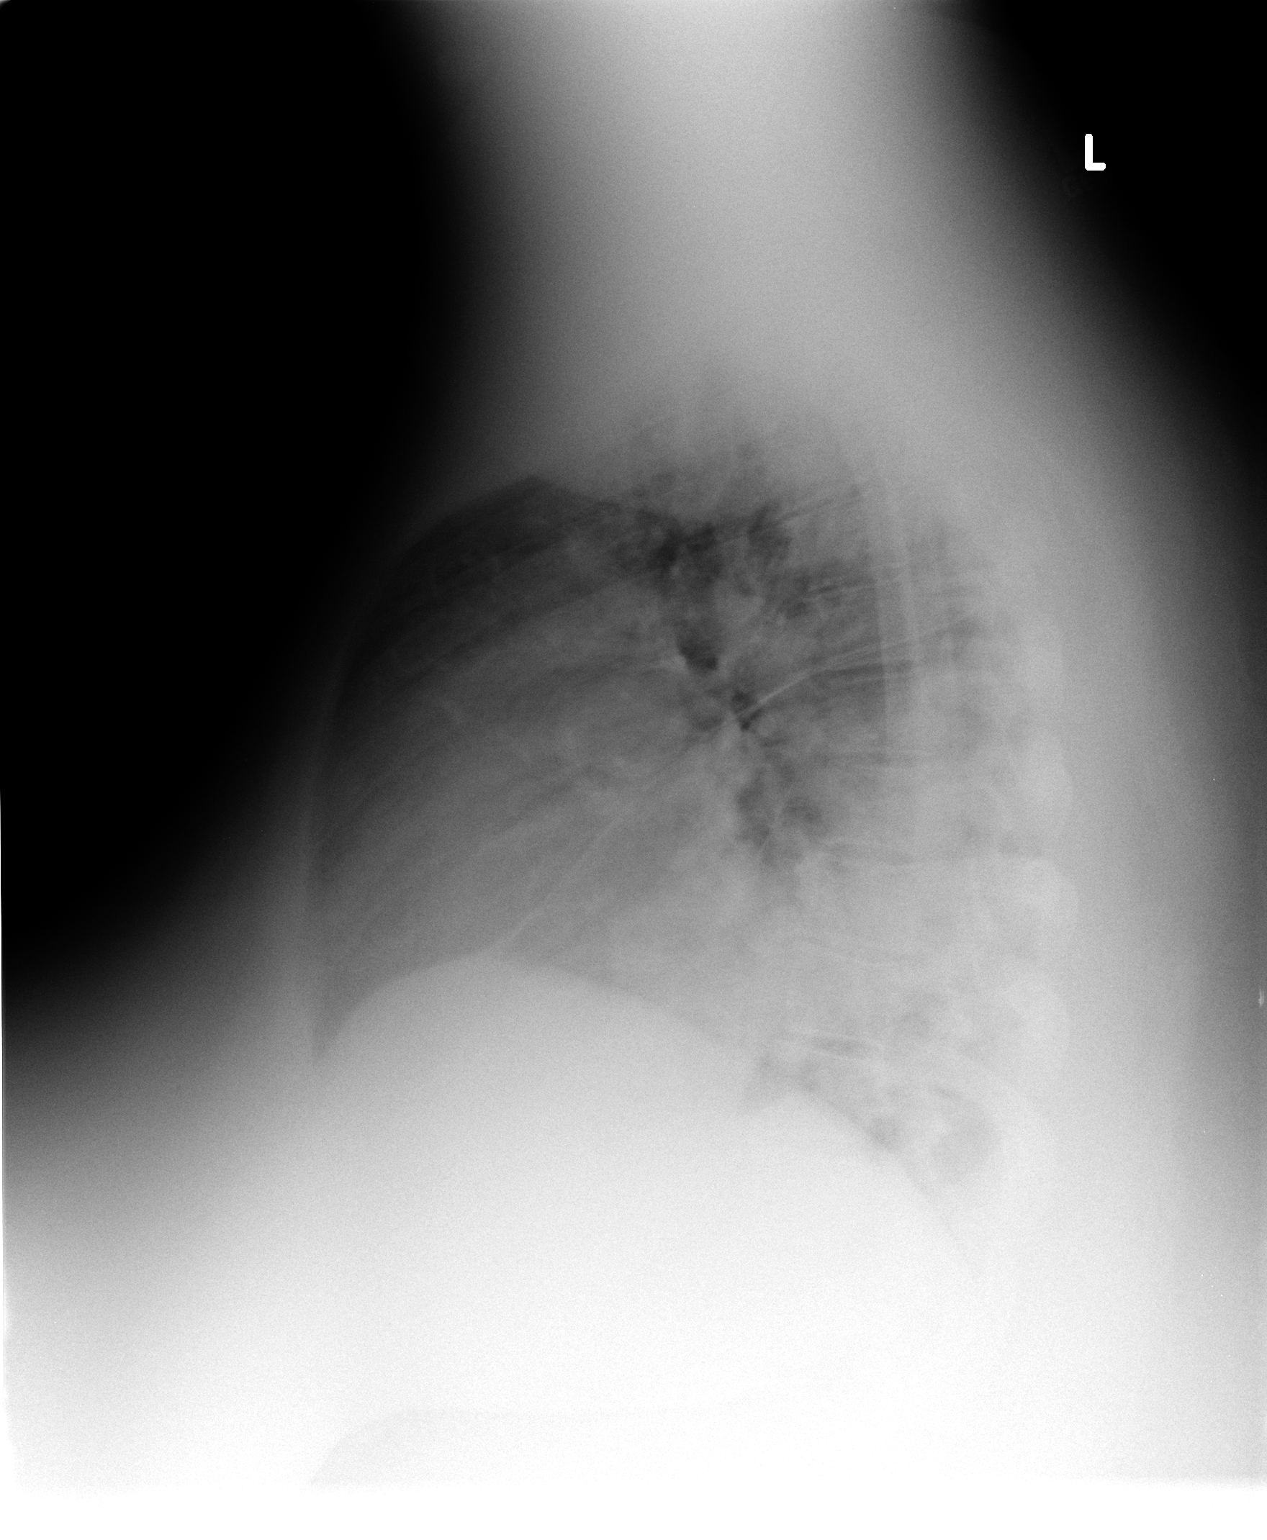

[2 of 2 positions shown; findings below may reference images not displayed]

FINDINGS: Interval slight worsening of the asymmetric airspace
pulmonary edema, left greater than right, since the examination
yesterday.  Heart enlarged but stable.  No visible pleural
effusions.  Visualized bony thorax intact.
IMPRESSION: Stable cardiomegaly.  Interval slight worsening of asymmetric
pulmonary edema, left greater than right, since yesterday.

## 2011-03-09 IMAGING — CR DG CHEST 1V PORT
1 series · 1 of 1 positions shown · non-contrast
Comparison: 07/03/2009 at [DATE] a.m.

CLINICAL DATA: Pulmonary edema.  PICC line placement.

PORTABLE CHEST - 1 VIEW

[AP]
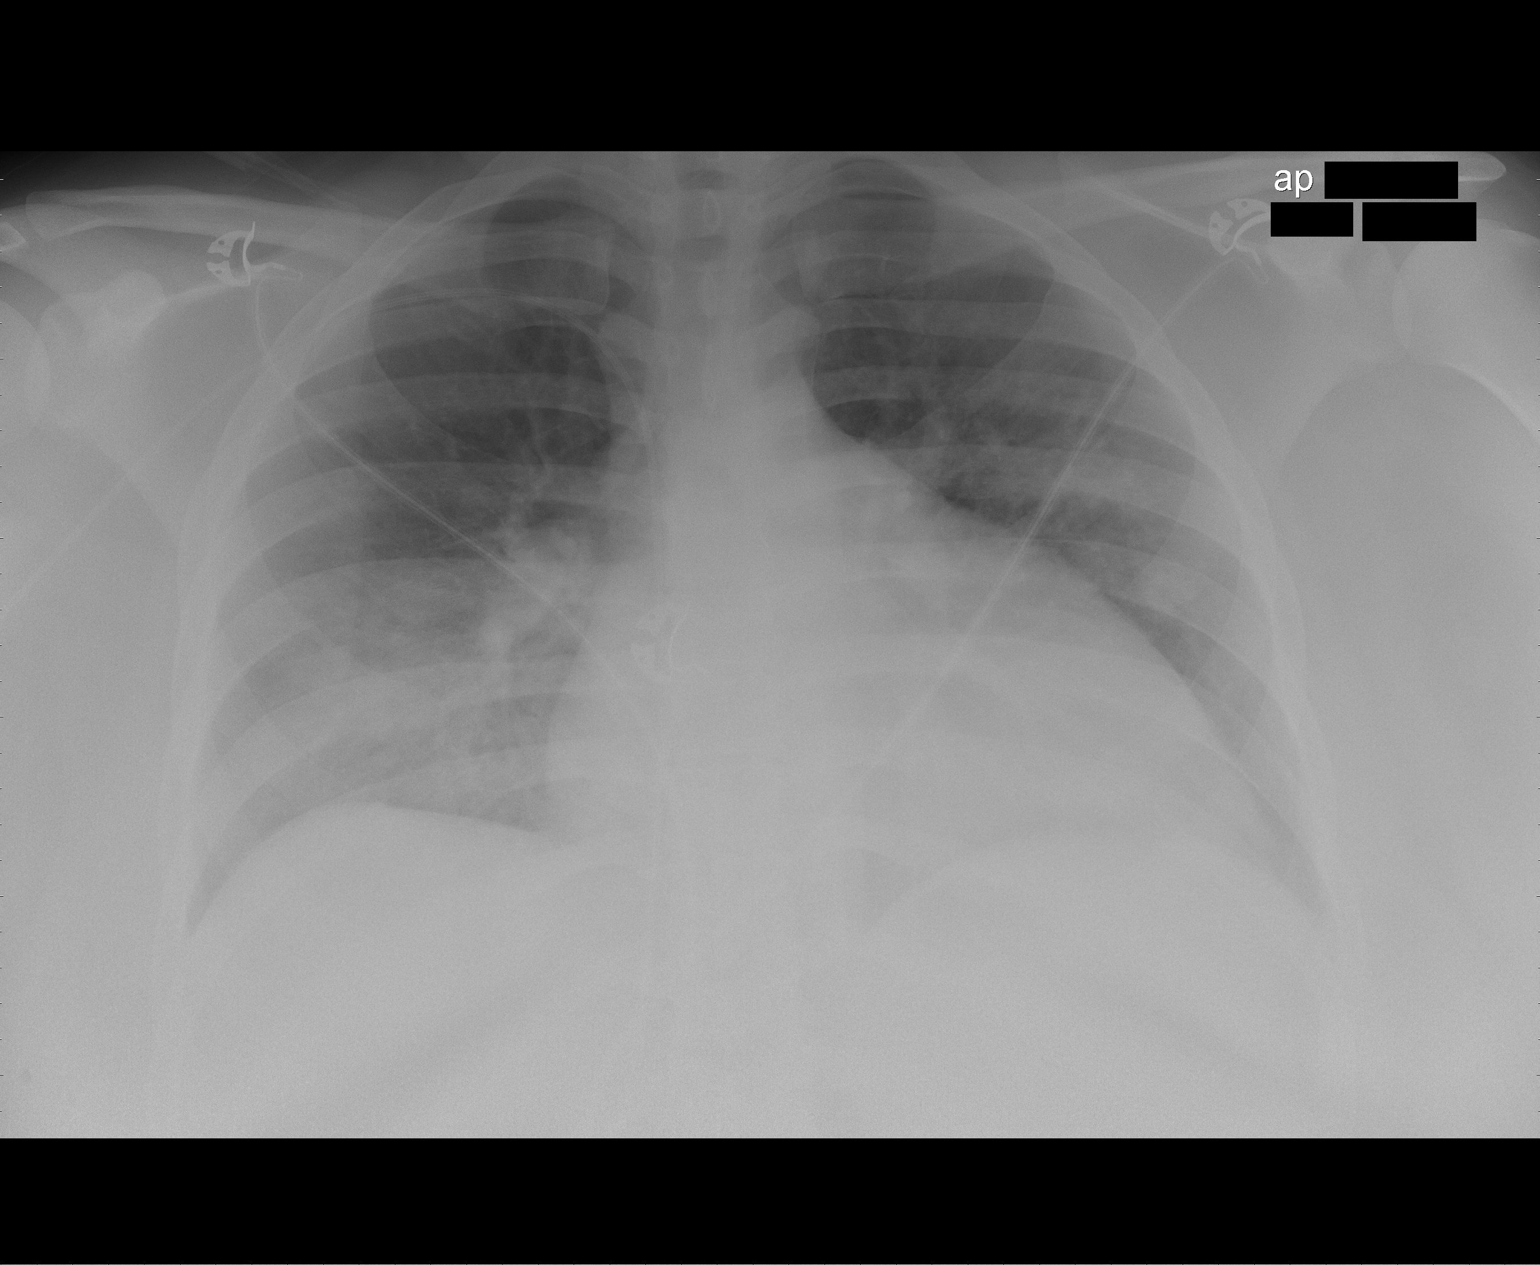

[1 of 1 positions shown; findings below may reference images not displayed]

FINDINGS: Right-sided PICC line has been inserted and the tip
appears to be 5.3 cm below the carina in the right atrium.  I
recommend that the catheter be retracted at least 2 cm.

The bilateral pulmonary edema has improved since the prior study.
Cardiomegaly persists.  Vascularity is improved.
IMPRESSION: 1.  PICC line is in the right atrium and needs be retracted at
least 2 cm.
2.  Improving bilateral pulmonary edema.

## 2011-03-09 IMAGING — CR DG CHEST 2V
2 series · 2 of 2 positions shown · non-contrast
Comparison: 06/30/2009

CLINICAL DATA: , shortness of breath.  Recent C-section.

CHEST - 2 VIEW

[view not recorded (1 of 2)]
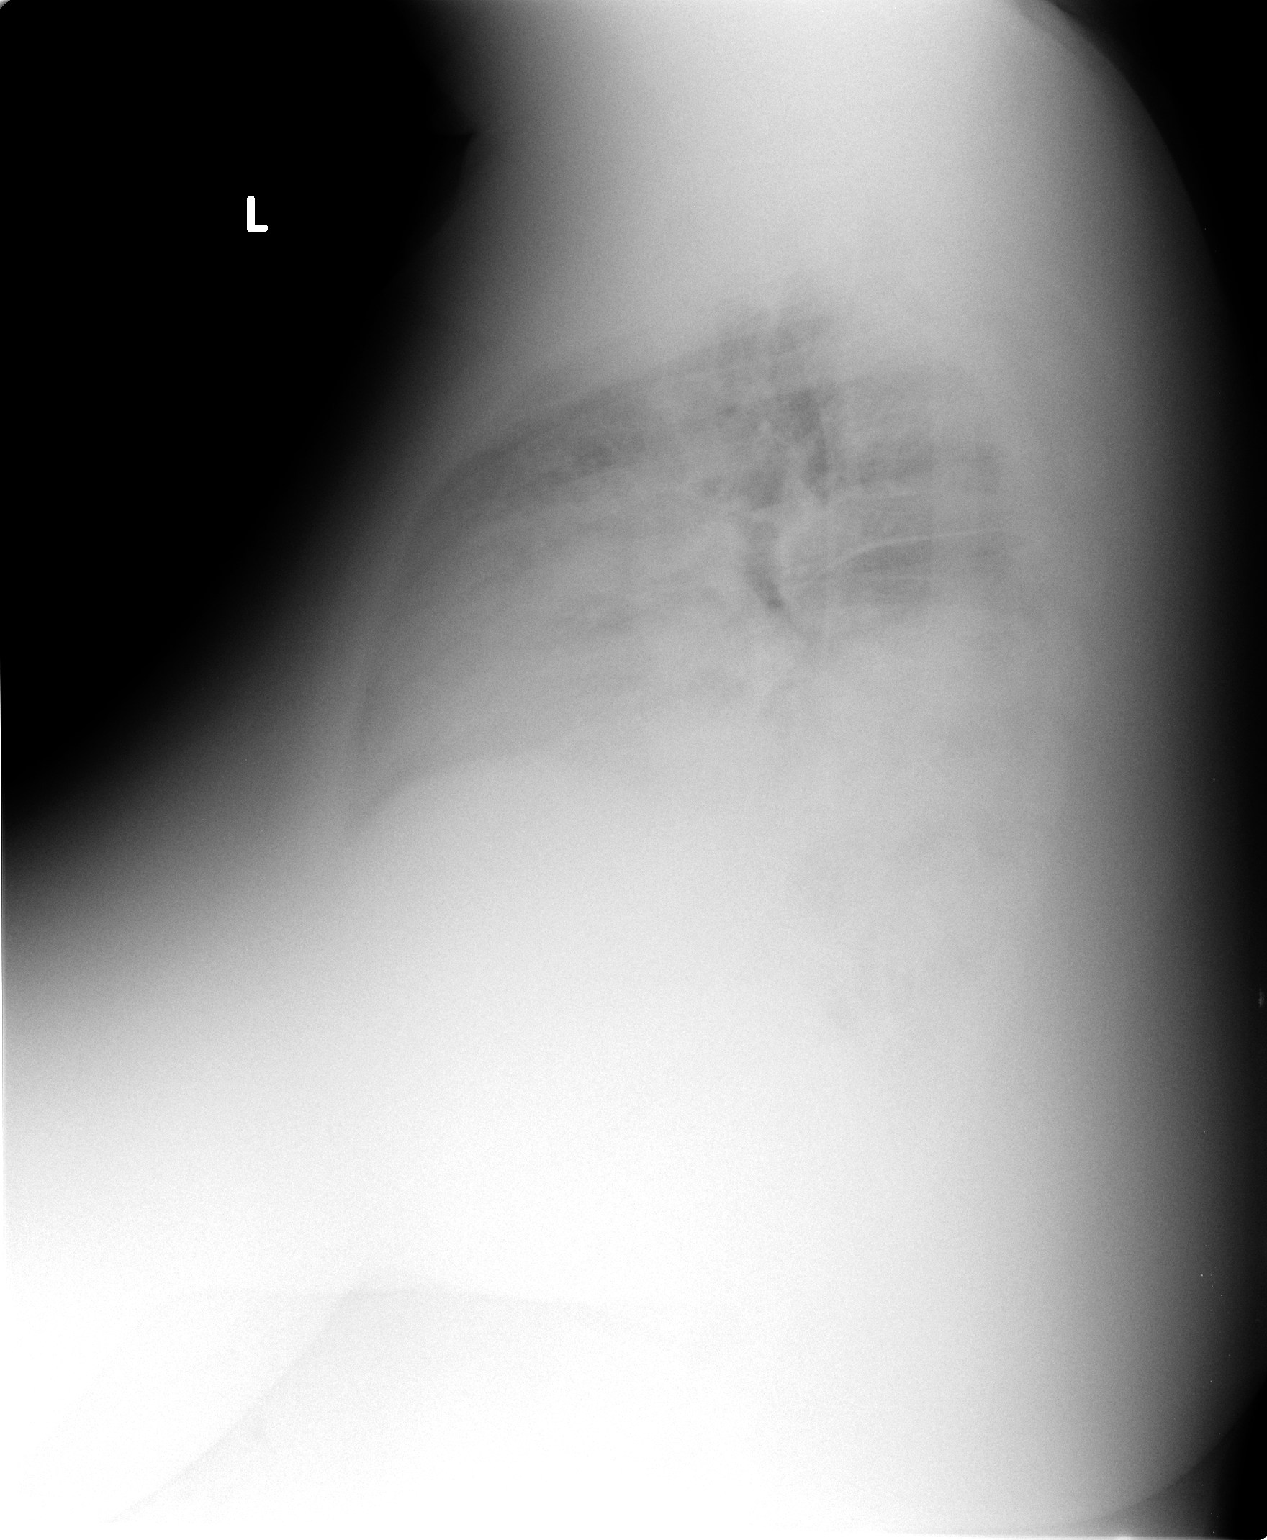

[view not recorded (2 of 2)]
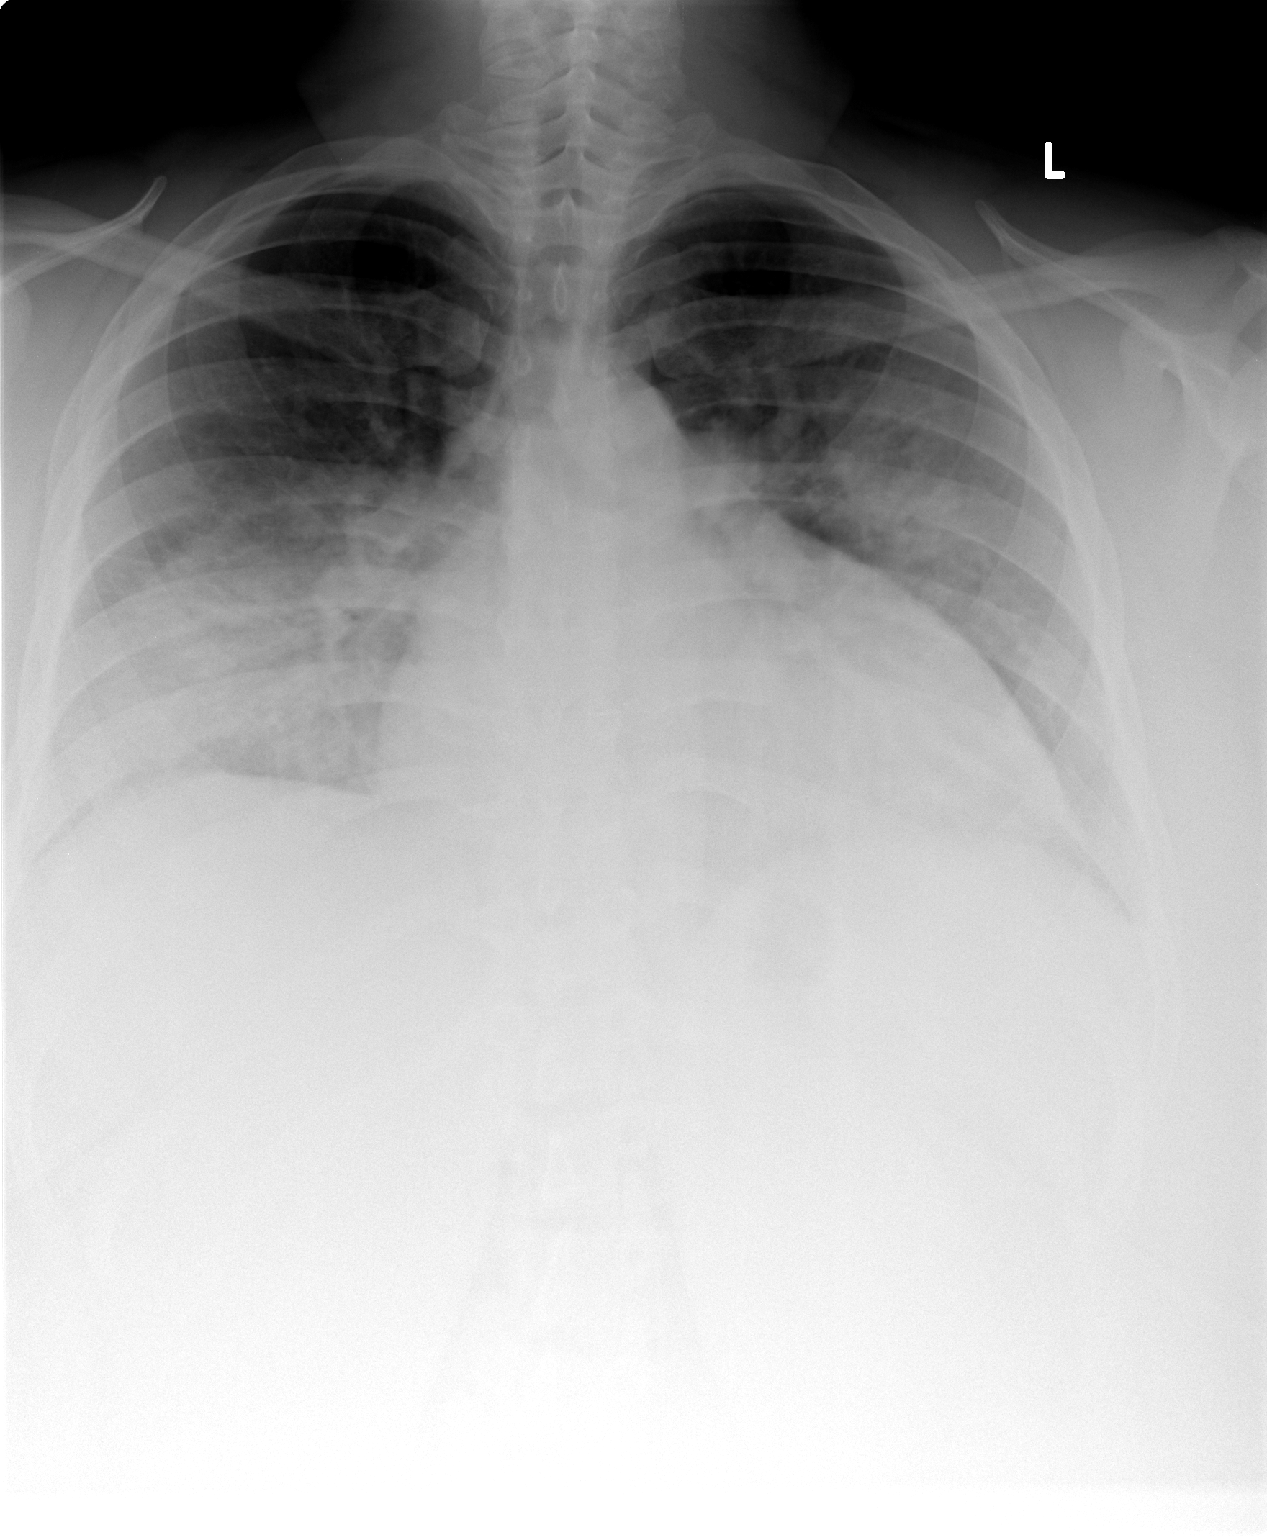

[2 of 2 positions shown; findings below may reference images not displayed]

FINDINGS: Stable cardiac enlargement.

Bilateral airspace edema left greater than right is again noted.
This appears unchanged from prior exam.

No pleural effusions noted.

The visualized bony thorax is intact.
IMPRESSION: 1.  Stable cardiac enlargement.
2.  No change in bilateral alveolar edema.

## 2011-07-19 ENCOUNTER — Ambulatory Visit: Payer: Medicaid Other | Admitting: Cardiology

## 2011-08-03 ENCOUNTER — Encounter: Payer: Self-pay | Admitting: Cardiology

## 2011-12-10 ENCOUNTER — Ambulatory Visit (INDEPENDENT_AMBULATORY_CARE_PROVIDER_SITE_OTHER): Payer: Self-pay | Admitting: Cardiology

## 2011-12-10 ENCOUNTER — Encounter: Payer: Self-pay | Admitting: Cardiology

## 2011-12-10 VITALS — BP 114/75 | HR 93 | Ht 65.0 in | Wt 307.8 lb

## 2011-12-10 DIAGNOSIS — I10 Essential (primary) hypertension: Secondary | ICD-10-CM | POA: Insufficient documentation

## 2011-12-10 DIAGNOSIS — R5381 Other malaise: Secondary | ICD-10-CM

## 2011-12-10 DIAGNOSIS — R5383 Other fatigue: Secondary | ICD-10-CM

## 2011-12-10 DIAGNOSIS — R0602 Shortness of breath: Secondary | ICD-10-CM

## 2011-12-10 LAB — BRAIN NATRIURETIC PEPTIDE: Pro B Natriuretic peptide (BNP): 10 pg/mL (ref 0.0–100.0)

## 2011-12-10 NOTE — Progress Notes (Signed)
HPI The patient presents for followup after being gone for about 18 months. She had a history of nonischemic cardiomyopathy with an ejection fraction as low as 25% followup echo in 2012 demonstrated it was up to 60%. Recently she did have a jump in her blood pressure and her primary provider added Norvasc. She has done much better with this. Her blood pressure is now well controlled. She denies any new shortness of breath though she has chronically gotten short of breath with some activities such as walking up an incline or stairs. She occasionally wakes up short of breath but is not classically describing PND or orthopnea. She doesn't report any increasing lower extremity swelling. She's not having any chest pressure, neck or arm discomfort.  No Known Allergies  Current Outpatient Prescriptions  Medication Sig Dispense Refill  . AMLODIPINE BESYLATE PO Take 5 mg by mouth daily.      . carvedilol (COREG) 12.5 MG tablet Take 12.5 mg by mouth 2 (two) times daily with a meal.        . enalapril (VASOTEC) 10 MG tablet Take 10 mg by mouth daily.        . furosemide (LASIX) 40 MG tablet Take 40 mg by mouth daily.        Marland Kitchen ibuprofen (ADVIL,MOTRIN) 200 MG tablet Take 400 mg by mouth every 4 (four) hours as needed. For pain       . ketoprofen (ORUDIS) 75 MG capsule Take 75 mg by mouth 4 (four) times daily as needed.      Marland Kitchen omeprazole (PRILOSEC) 20 MG capsule Take 20 mg by mouth 2 (two) times daily.      . orphenadrine (NORFLEX) 100 MG tablet Take 100 mg by mouth 2 (two) times daily.        Past Medical History  Diagnosis Date  . Migraines   . Gallstones   . PIH (pregnancy induced hypertension)   . Cardiomyopathy, peripartum, postpartum   . Morbid obesity     Past Surgical History  Procedure Date  . Cesarean section   . Tubal ligation     ROS:  As stated in the HPI and negative for all other systems.  PHYSICAL EXAM  BP 114/75  Pulse 93  Ht 5\' 5"  (1.651 m)  Wt 307 lb 12.8 oz (139.617  kg)  BMI 51.22 kg/m2 GENERAL:  Well appearing HEENT:  Pupils equal round and reactive, fundi not visualized, oral mucosa unremarkable NECK:  No jugular venous distention, waveform within normal limits, carotid upstroke brisk and symmetric, no bruits, no thyromegaly LYMPHATICS:  No cervical, inguinal adenopathy LUNGS:  Clear to auscultation bilaterally BACK:  No CVA tenderness CHEST:  Unremarkable HEART:  PMI not displaced or sustained,S1 and S2 within normal limits, no S3, no S4, no clicks, no rubs, no murmurs ABD:  Flat, positive bowel sounds normal in frequency in pitch, no bruits, no rebound, no guarding, no midline pulsatile mass, no hepatomegaly, no splenomegaly EXT:  2 plus pulses throughout, no edema, no cyanosis no clubbing SKIN:  No rashes no nodules NEURO:  Cranial nerves II through XII grossly intact, motor grossly intact throughout PSYCH:  Cognitively intact, oriented to person place and time  EKG:  Sinus rhythm, rate 93, axis within normal limits, intervals within normal limits, no acute ST-T wave changes.  12/10/2011  ASSESSMENT AND PLAN  CARDIOMYOPATHY Her EF was improved at the last echo 55% from a low of 25% previously. I will check a BNP level. If this is  normal no change in therapy or further evaluation is warranted.  ESSENTIAL HYPERTENSION Her blood pressure is now controlled with recent medication changes. This was a reasonable regimen and she will continue on this.  OBESITY, MORBID  She has been unsuccessful in losing weight and we again discussed specific strategies.

## 2011-12-10 NOTE — Patient Instructions (Addendum)
The current medical regimen is effective;  continue present plan and medications.  Please have blood work today (BNP, TSH)  Follow up as needed.

## 2012-04-22 ENCOUNTER — Ambulatory Visit: Payer: Medicaid Other | Attending: Sports Medicine

## 2012-04-22 DIAGNOSIS — M6281 Muscle weakness (generalized): Secondary | ICD-10-CM | POA: Insufficient documentation

## 2012-04-22 DIAGNOSIS — R293 Abnormal posture: Secondary | ICD-10-CM | POA: Insufficient documentation

## 2012-04-22 DIAGNOSIS — IMO0001 Reserved for inherently not codable concepts without codable children: Secondary | ICD-10-CM | POA: Insufficient documentation

## 2012-04-22 DIAGNOSIS — M2550 Pain in unspecified joint: Secondary | ICD-10-CM | POA: Insufficient documentation

## 2012-04-29 ENCOUNTER — Ambulatory Visit: Payer: Medicaid Other | Admitting: Physical Therapy

## 2012-05-13 ENCOUNTER — Ambulatory Visit: Payer: Medicaid Other | Attending: Sports Medicine | Admitting: Physical Therapy

## 2012-05-13 DIAGNOSIS — M6281 Muscle weakness (generalized): Secondary | ICD-10-CM | POA: Insufficient documentation

## 2012-05-13 DIAGNOSIS — M2550 Pain in unspecified joint: Secondary | ICD-10-CM | POA: Insufficient documentation

## 2012-05-13 DIAGNOSIS — IMO0001 Reserved for inherently not codable concepts without codable children: Secondary | ICD-10-CM | POA: Insufficient documentation

## 2012-05-13 DIAGNOSIS — R293 Abnormal posture: Secondary | ICD-10-CM | POA: Insufficient documentation

## 2012-05-27 ENCOUNTER — Ambulatory Visit: Payer: Medicaid Other | Admitting: Physical Therapy

## 2012-09-15 ENCOUNTER — Other Ambulatory Visit (HOSPITAL_COMMUNITY): Payer: Self-pay | Admitting: Internal Medicine

## 2012-09-15 DIAGNOSIS — R1011 Right upper quadrant pain: Secondary | ICD-10-CM

## 2012-09-18 ENCOUNTER — Ambulatory Visit (HOSPITAL_COMMUNITY)
Admission: RE | Admit: 2012-09-18 | Discharge: 2012-09-18 | Disposition: A | Discharge status: Home / Self Care | Payer: Medicaid Other | Source: Ambulatory Visit | Attending: Internal Medicine | Admitting: Internal Medicine

## 2012-09-18 DIAGNOSIS — K7689 Other specified diseases of liver: Secondary | ICD-10-CM | POA: Insufficient documentation

## 2012-09-18 DIAGNOSIS — K8020 Calculus of gallbladder without cholecystitis without obstruction: Secondary | ICD-10-CM | POA: Insufficient documentation

## 2012-09-18 DIAGNOSIS — R1011 Right upper quadrant pain: Secondary | ICD-10-CM

## 2012-09-22 ENCOUNTER — Encounter: Payer: Self-pay | Admitting: Obstetrics

## 2012-10-06 ENCOUNTER — Ambulatory Visit: Payer: Medicaid Other | Admitting: Obstetrics

## 2012-10-21 ENCOUNTER — Ambulatory Visit (INDEPENDENT_AMBULATORY_CARE_PROVIDER_SITE_OTHER): Payer: Medicaid Other | Admitting: Obstetrics

## 2012-10-21 ENCOUNTER — Encounter: Payer: Self-pay | Admitting: Obstetrics

## 2012-10-21 VITALS — BP 122/74 | HR 97 | Temp 98.5°F | Ht 65.0 in | Wt 326.0 lb

## 2012-10-21 DIAGNOSIS — R87612 Low grade squamous intraepithelial lesion on cytologic smear of cervix (LGSIL): Secondary | ICD-10-CM | POA: Insufficient documentation

## 2012-10-21 DIAGNOSIS — Z3202 Encounter for pregnancy test, result negative: Secondary | ICD-10-CM

## 2012-10-21 DIAGNOSIS — Z01812 Encounter for preprocedural laboratory examination: Secondary | ICD-10-CM

## 2012-10-21 NOTE — Progress Notes (Deleted)
Colposcopy Procedure Note  Indications: Pap smear 1 months ago showed: low-grade squamous intraepithelial neoplasia (LGSIL - encompassing HPV,mild dysplasia,CIN I). The prior pap showed no abnormalities.  Prior cervical/vaginal disease: normal exam without visible pathology. Prior cervical treatment: no treatment.  Procedure Details  The risks and benefits of the procedure and Written informed consent obtained.  A time-out was performed confirming the patient, procedure and allergy status  Speculum placed in vagina and excellent visualization of cervix achieved, cervix swabbed x 3 with acetic acid solution.  Findings: Cervix: {Findings; colposcopy:728}; {Procedures; colposcopy:729}.   Vaginal inspection: {Findings; colposcopy:730}. Vulvar colposcopy: {Exam; colposcopy vulvar:731}.    Specimens: ***  Complications: {complications:13423}.  Plan: {Plan; colonoscopy:734}

## 2012-10-21 NOTE — Progress Notes (Signed)
Consultation for abnormal pap smear.  All questions answered. Recommendations discussed.  Literature dispensed on management of abnormal paps. Plan:  F/U in 3 weeks for colposcopy.

## 2012-10-23 ENCOUNTER — Ambulatory Visit (INDEPENDENT_AMBULATORY_CARE_PROVIDER_SITE_OTHER): Payer: Medicaid Other | Admitting: General Surgery

## 2012-10-23 ENCOUNTER — Encounter (INDEPENDENT_AMBULATORY_CARE_PROVIDER_SITE_OTHER): Payer: Self-pay | Admitting: General Surgery

## 2012-10-23 VITALS — BP 120/70 | HR 84 | Temp 98.2°F | Resp 15 | Ht 65.0 in | Wt 325.6 lb

## 2012-10-23 DIAGNOSIS — K8020 Calculus of gallbladder without cholecystitis without obstruction: Secondary | ICD-10-CM

## 2012-10-23 NOTE — Patient Instructions (Signed)
Once we get cardiology clearance, our office will contact you to schedule surgery. If you have not heard from our office within one week please contact me  [phone]  Laparoscopic Cholecystectomy Laparoscopic cholecystectomy is surgery to remove the gallbladder. The gallbladder is located slightly to the right of center in the abdomen, behind the liver. It is a concentrating and storage sac for the bile produced in the liver. Bile aids in the digestion and absorption of fats. Gallbladder disease (cholecystitis) is an inflammation of your gallbladder. This condition is usually caused by a buildup of gallstones (cholelithiasis) in your gallbladder. Gallstones can block the flow of bile, resulting in inflammation and pain. In severe cases, emergency surgery may be required. When emergency surgery is not required, you will have time to prepare for the procedure. Laparoscopic surgery is an alternative to open surgery. Laparoscopic surgery usually has a shorter recovery time. Your common bile duct may also need to be examined and explored. Your caregiver will discuss this with you if he or she feels this should be done. If stones are found in the common bile duct, they may be removed. LET YOUR CAREGIVER KNOW ABOUT:  Allergies to food or medicine.  Medicines taken, including vitamins, herbs, eyedrops, over-the-counter medicines, and creams.  Use of steroids (by mouth or creams).  Previous problems with anesthetics or numbing medicines.  History of bleeding problems or blood clots.  Previous surgery.  Other health problems, including diabetes and kidney problems.  Possibility of pregnancy, if this applies. RISKS AND COMPLICATIONS All surgery is associated with risks. Some problems that may occur following this procedure include:  Infection.  Damage to the common bile duct, nerves, arteries, veins, or other internal organs such as the stomach or intestines.  Bleeding.  A stone may remain  in the common bile duct. BEFORE THE PROCEDURE  Do not take aspirin for 3 days prior to surgery or blood thinners for 1 week prior to surgery.  Do not eat or drink anything after midnight the night before surgery.  Let your caregiver know if you develop a cold or other infectious problem prior to surgery.  You should be present 60 minutes before the procedure or as directed. PROCEDURE  You will be given medicine that makes you sleep (general anesthetic). When you are asleep, your surgeon will make several small cuts (incisions) in your abdomen. One of these incisions is used to insert a small, lighted scope (laparoscope) into the abdomen. The laparoscope helps the surgeon see into your abdomen. Carbon dioxide gas will be pumped into your abdomen. The gas allows more room for the surgeon to perform your surgery. Other operating instruments are inserted through the other incisions. Laparoscopic procedures may not be appropriate when:  There is major scarring from previous surgery.  The gallbladder is extremely inflamed.  There are bleeding disorders or unexpected cirrhosis of the liver.  A pregnancy is near term.  Other conditions make the laparoscopic procedure impossible. If your surgeon feels it is not safe to continue with a laparoscopic procedure, he or she will perform an open abdominal procedure. In this case, the surgeon will make an incision to open the abdomen. This gives the surgeon a larger view and field to work within. This may allow the surgeon to perform procedures that sometimes cannot be performed with a laparoscope alone. Open surgery has a longer recovery time. AFTER THE PROCEDURE  You will be taken to the recovery area where a nurse will watch and check your progress.  You may be allowed to go home the same day.  Do not resume physical activities until directed by your caregiver.  You may resume a normal diet and activities as directed. Document Released: 12/25/2004  Document Revised: 03/19/2011 Document Reviewed: 06/09/2010 [facility] Patient Information 2014 [place], [place].

## 2012-10-24 ENCOUNTER — Encounter (INDEPENDENT_AMBULATORY_CARE_PROVIDER_SITE_OTHER): Payer: Self-pay

## 2012-10-27 NOTE — Progress Notes (Signed)
Patient ID: Zareth Rippetoe, female   DOB: [birth year], 32 y.o.   MRN: [id number]  Chief Complaint  Patient presents with  . New Evaluation    eval gb    HPI Frieda Arnall is a 32 y.o. female.  HPI 32 yo morbidly obese AAF referred by Dr Greggory Stallion Osei-Bonsu For evaluation of gallbladder problems. She states that she has had gallbladder problems for the past 12 years; however, it only really started bothering her about a month and half ago. She describes epigastric discomfort radiating up to her right upper quadrant and into her back. Will generally last several hours. She will often get bloated and nauseous with it. She denies any emesis. She denies any NSAID use. She denies any acholic stools. She reports daily bowel movements. She denies any unplanned weight loss. She initially was able to modify her diet and her symptoms improved however over the past several weeks even with eating baked foods the pain is now occurring with all oral intake. As a result she reports a diminished appetite. Pain medication helps with the discomfort. She denies any melena or hematochezia. She denies hematemesis. Past Medical History  Diagnosis Date  . Migraines   . Gallstones   . PIH (pregnancy induced hypertension)   . Cardiomyopathy, peripartum, postpartum   . Morbid obesity   . Arthritis   . CHF (congestive heart failure)     Past Surgical History  Procedure Laterality Date  . Cesarean section    . Tubal ligation      Family History  Problem Relation Age of Onset  . Hypertension Mother   . Diabetes Mother   . Heart disease Mother   . Miscarriages / [place] Mother   . COPD Mother   . Arthritis Mother   . Alcohol abuse Father   . Asthma Father   . Diabetes Maternal Grandmother   . Heart disease Maternal Grandmother   . Cancer Maternal Grandfather     leukemia  . Arthritis Maternal Grandfather   . Cancer Paternal Grandmother     breast  . Cancer Paternal Grandfather     leukemia     Social History History  Substance Use Topics  . Smoking status: Never Smoker   . Smokeless tobacco: Never Used  . Alcohol Use: No    No Known Allergies  Current Outpatient Prescriptions  Medication Sig Dispense Refill  . AMLODIPINE BESYLATE PO Take 5 mg by mouth daily.      . carvedilol (COREG) 12.5 MG tablet Take 12.5 mg by mouth 2 (two) times daily with a meal.        . cholecalciferol (VITAMIN D) 1000 UNITS tablet Take 1,000 Units by mouth daily.      . enalapril (VASOTEC) 10 MG tablet Take 10 mg by mouth daily.        . furosemide (LASIX) 40 MG tablet Take 40 mg by mouth daily.        Marland Kitchen gabapentin (NEURONTIN) 300 MG capsule Take 300 mg by mouth 2 (two) times daily.      Marland Kitchen HYDROcodone-acetaminophen (NORCO) 7.5-325 MG per tablet Take 1 tablet by mouth every 6 (six) hours as needed for pain.      Marland Kitchen ibuprofen (ADVIL,MOTRIN) 200 MG tablet Take 400 mg by mouth every 4 (four) hours as needed. For pain       . ketoprofen (ORUDIS) 75 MG capsule Take 75 mg by mouth 4 (four) times daily as needed.      . nabumetone (RELAFEN) 500  MG tablet Take 500 mg by mouth 2 (two) times daily.      Marland Kitchen omeprazole (PRILOSEC) 20 MG capsule Take 20 mg by mouth 2 (two) times daily.      . orphenadrine (NORFLEX) 100 MG tablet Take 100 mg by mouth 2 (two) times daily.      . promethazine (PHENERGAN) 25 MG tablet Take 25 mg by mouth every 6 (six) hours as needed for nausea.      . promethazine (PHENERGAN) 25 MG tablet Take 1 tablet (25 mg total) by mouth every 6 (six) hours as needed for nausea.  20 tablet  0   No current facility-administered medications for this visit.    Review of Systems Review of Systems  Constitutional: Negative for fever, activity change, appetite change and unexpected weight change.  HENT: Negative for nosebleeds and trouble swallowing.   Eyes: Negative for photophobia and visual disturbance.  Respiratory: Negative for chest tightness and shortness of breath.        Saw a doctor  in [place] - had sleep study; awaiting results  Cardiovascular: Negative for chest pain and leg swelling.       Denies CP, some SOB, some DOE; sleeps on 4 pillows b/c of comfort and breathing  Gastrointestinal:       See HPI  Genitourinary: Negative for dysuria and difficulty urinating.  Musculoskeletal: Negative for arthralgias.  Skin: Negative for pallor and rash.  Neurological: Negative for dizziness, seizures, facial asymmetry and numbness.       Denies TIA and amaurosis fugax   Hematological: Negative for adenopathy. Does not bruise/bleed easily.  Psychiatric/Behavioral: Negative for behavioral problems and agitation.    Blood pressure 120/70, pulse 84, temperature 98.2 F (36.8 C), temperature source Temporal, resp. rate 15, height 5\' 5"  (1.651 m), weight 325 lb 9.6 oz (147.691 kg), last menstrual period 09/30/2012.  Physical Exam Physical Exam  Vitals reviewed. Constitutional: She is oriented to person, place, and time. She appears well-developed and well-nourished. No distress.  Morbidly obese  HENT:  Head: Normocephalic and atraumatic.  Right Ear: External ear normal.  Left Ear: External ear normal.  Eyes: Conjunctivae are normal. No scleral icterus.  Neck: Normal range of motion. Neck supple. No tracheal deviation present. No thyromegaly present.  Cardiovascular: Normal rate and normal heart sounds.   Pulmonary/Chest: Effort normal and breath sounds normal. No stridor. No respiratory distress. She has no wheezes.  Abdominal: Soft. She exhibits no distension. There is no tenderness. There is no rebound and no guarding.    obese  Musculoskeletal: She exhibits no edema and no tenderness.  Lymphadenopathy:    She has no cervical adenopathy.  Neurological: She is alert and oriented to person, place, and time. She exhibits normal muscle tone.  Skin: Skin is warm and dry. No rash noted. She is not diaphoretic. No erythema.  Psychiatric: She has a normal mood and  affect. Her behavior is normal. Judgment and thought content normal.    Data Reviewed Dr Antoine Poche note 12/13 U/s 09/15/12 - +GS, mild CBD dilatation.  Dr Julio Sicks note 10/17/12; 09/11/12  Assessment    Morbid obesity BMI 54 Symptomatic cholelithiasis CHF HTN Snoring GERD Impaired fasting glucose     Plan    I believe the patient's symptoms are consistent with gallbladder disease.  We discussed gallbladder disease. The patient was given Agricultural engineer. We discussed non-operative and operative management. We discussed the signs & symptoms of acute cholecystitis  I discussed laparoscopic cholecystectomy with IOC in detail.  The patient was given educational material as well as diagrams detailing the procedure.  We discussed the risks and benefits of a laparoscopic cholecystectomy including, but not limited to bleeding, infection, injury to surrounding structures such as the intestine or liver, bile leak, retained gallstones, need to convert to an open procedure, prolonged diarrhea, blood clots such as  DVT, common bile duct injury, anesthesia risks, and possible need for additional procedures.  We discussed the typical post-operative recovery course. I explained that the likelihood of improvement of their symptoms is good.  I explained that before we could schedule her for surgery she would need to have cardiac clearance. Once we have obtained cardiac clearance from her cardiologist she will be contacted to schedule laparoscopic cholecystectomy with intraoperative cholangiogram  Mary Sella. Andrey Campanile, MD, FACS General, Bariatric, & Minimally Invasive Surgery [facility], [place]         [facility] M 10/27/2012, 3:29 PM

## 2012-11-09 ENCOUNTER — Other Ambulatory Visit: Payer: Self-pay | Admitting: Obstetrics

## 2012-11-10 ENCOUNTER — Encounter: Payer: Self-pay | Admitting: Obstetrics

## 2012-11-10 ENCOUNTER — Ambulatory Visit (INDEPENDENT_AMBULATORY_CARE_PROVIDER_SITE_OTHER): Payer: Medicaid Other | Admitting: Obstetrics

## 2012-11-10 VITALS — BP 124/80 | HR 103 | Temp 98.4°F | Ht 65.0 in | Wt 326.0 lb

## 2012-11-10 DIAGNOSIS — R87612 Low grade squamous intraepithelial lesion on cytologic smear of cervix (LGSIL): Secondary | ICD-10-CM

## 2012-11-10 DIAGNOSIS — N760 Acute vaginitis: Secondary | ICD-10-CM | POA: Insufficient documentation

## 2012-11-10 NOTE — Progress Notes (Signed)
Colposcopy Procedure Note  Indications: Pap smear 2 months ago showed: benign cellular changes. The prior pap showed no abnormalities.  Prior cervical/vaginal disease: normal exam without visible pathology. Prior cervical treatment: no treatment.  Procedure Details  The risks and benefits of the procedure and Written informed consent obtained.  A time-out was performed confirming the patient, procedure and allergy status  Speculum placed in vagina and excellent visualization of cervix achieved, cervix swabbed x 3 with acetic acid solution.  Findings: Cervix: acetowhite lesion(s) noted at 6 o'clock; SCJ visualized 360 degrees without lesions, endocervical curettage performed, cervical biopsies taken at 6 and 12  o'clock, specimen labelled and sent to pathology and hemostasis achieved with silver nitrate.   Vaginal inspection: vaginal colposcopy not performed. Vulvar colposcopy: vulvar colposcopy not performed.    Specimens: ECC and cervical biopsies.  Complications: none.  Plan: Specimens labelled and sent to Pathology. Will base further treatment on Pathology findings. Post biopsy instructions given to patient. Return to discuss Pathology results in 2 weeks.

## 2012-11-11 LAB — WET PREP BY MOLECULAR PROBE
Gardnerella vaginalis: NEGATIVE
Trichomonas vaginosis: NEGATIVE

## 2012-11-18 ENCOUNTER — Telehealth: Payer: Self-pay | Admitting: Cardiology

## 2012-11-18 ENCOUNTER — Telehealth (INDEPENDENT_AMBULATORY_CARE_PROVIDER_SITE_OTHER): Payer: Self-pay | Admitting: General Surgery

## 2012-11-18 NOTE — Telephone Encounter (Signed)
Spoke with pt. The last office visit with Dr. Antoine Poche was 12/10/11. An appointment was made for pt with Norma Fredrickson on 11/25/12 at 8:30 AM pt aware.

## 2012-11-18 NOTE — Telephone Encounter (Signed)
New Problem  Pt in need of cardiac clearance for Gallbladder removal... appt not scheduled.  [facility] [phone]  Please call to confirm clearance.

## 2012-11-18 NOTE — Telephone Encounter (Signed)
Message copied by Liliana Cline on Tue Nov 18, 2012  2:39 PM ------      Message from: Docia Chuck      Created: Tue Nov 18, 2012 12:50 PM      Regarding: Patient is looking to schedule her surgery       Patient called and is waiting to be scheduled.  She needed cardiac clearance before we would scheduled but don't know if that has come in yet.            Please advise or call the patient and let her know what the status is.            Thanks       ------

## 2012-11-18 NOTE — Telephone Encounter (Signed)
Called and made patient aware that I sent a second request for clearance on 11/12/12. Made her aware it may be helpful for her to call- they may need to see patient since her last appt was 12/2011. She will do this and we will schedule surgery as soon as we get clearance.

## 2012-11-24 ENCOUNTER — Ambulatory Visit (INDEPENDENT_AMBULATORY_CARE_PROVIDER_SITE_OTHER): Payer: Medicaid Other | Admitting: Obstetrics

## 2012-11-24 ENCOUNTER — Encounter: Payer: Self-pay | Admitting: Obstetrics

## 2012-11-24 VITALS — BP 139/85 | HR 94 | Ht 65.0 in | Wt 330.0 lb

## 2012-11-24 DIAGNOSIS — N870 Mild cervical dysplasia: Secondary | ICD-10-CM | POA: Insufficient documentation

## 2012-11-24 NOTE — Progress Notes (Signed)
Subjective:     Allison [name] is a 32 y.o. female here for a routine exam.  Current complaints: test results. Pt states she had a colpo about 2 weeks ago.   Personal health questionnaire reviewed: yes.   Gynecologic History Patient's last menstrual period was 10/24/2012. Contraception: abstinence BTL   Obstetric History OB History  No data available     The following portions of the patient's history were reviewed and updated as appropriate: allergies, current medications, past family history, past medical history, past social history, past surgical history and problem list.  Review of Systems Pertinent items are noted in HPI.    Objective:    No exam performed today, Consult only for test results..      Assessment:    LGSIL   Plan:    Education reviewed: safe sex/STD prevention and management of LGSIL. Follow up in: 6 months.

## 2012-11-25 ENCOUNTER — Encounter: Payer: Self-pay | Admitting: Nurse Practitioner

## 2012-11-25 ENCOUNTER — Ambulatory Visit (INDEPENDENT_AMBULATORY_CARE_PROVIDER_SITE_OTHER): Payer: Medicaid Other | Admitting: Nurse Practitioner

## 2012-11-25 VITALS — BP 130/80 | HR 77 | Ht 65.0 in | Wt 332.8 lb

## 2012-11-25 DIAGNOSIS — I10 Essential (primary) hypertension: Secondary | ICD-10-CM

## 2012-11-25 DIAGNOSIS — Z01818 Encounter for other preprocedural examination: Secondary | ICD-10-CM

## 2012-11-25 NOTE — Patient Instructions (Addendum)
We will get an ultrasound of your heart  Stay on your current medicines  See Dr. Antoine Poche back as planned  I think you are an acceptable candidate for your surgery but I would like to get your echo done prior  Call the [facility] office at [phone] if you have any questions, problems or concerns.

## 2012-11-25 NOTE — Progress Notes (Signed)
Allison [name] Date of Birth: May 23, 1980 Medical Record #[id number]  History of Present Illness: Allison [name] is seen today for a pre op visit. Seen for Dr. Antoine Poche. She has a history of a nonischemic CM with an EF as low of 25% but it has improved and up to 60% (but I am not able to locate this echo) on follow up echo. Her other issues include HTN, GERD, and morbid obesity.   She presents today for a pre op exam. She is here alone. Needs her gallbladder out. Feels good. No chest pain. Not short of breath. No swelling. Energy level is ok. Tolerating her medicines. Labs are checked by her PCP and that has been done recently.    Current Outpatient Prescriptions  Medication Sig Dispense Refill  . AMLODIPINE BESYLATE PO Take 5 mg by mouth daily.      . carvedilol (COREG) 12.5 MG tablet Take 12.5 mg by mouth 2 (two) times daily with a meal.        . cholecalciferol (VITAMIN D) 1000 UNITS tablet Take 1,000 Units by mouth daily.      . enalapril (VASOTEC) 10 MG tablet Take 10 mg by mouth daily.        . furosemide (LASIX) 40 MG tablet Take 40 mg by mouth daily.        Marland Kitchen gabapentin (NEURONTIN) 300 MG capsule Take 300 mg by mouth 2 (two) times daily.      Marland Kitchen HYDROcodone-acetaminophen (NORCO) 7.5-325 MG per tablet Take 1 tablet by mouth every 6 (six) hours as needed for pain.      Marland Kitchen ibuprofen (ADVIL,MOTRIN) 200 MG tablet Take 400 mg by mouth every 4 (four) hours as needed. For pain       . omeprazole (PRILOSEC) 20 MG capsule Take 20 mg by mouth 2 (two) times daily.      . orphenadrine (NORFLEX) 100 MG tablet Take 100 mg by mouth 2 (two) times daily.      . promethazine (PHENERGAN) 25 MG tablet Take 25 mg by mouth every 6 (six) hours as needed for nausea.      . promethazine (PHENERGAN) 25 MG tablet Take 1 tablet (25 mg total) by mouth every 6 (six) hours as needed for nausea.  20 tablet  0   No current facility-administered medications for this visit.    No Known Allergies  Past Medical History    Diagnosis Date  . Migraines   . Gallstones   . PIH (pregnancy induced hypertension)   . Cardiomyopathy, peripartum, postpartum   . Morbid obesity   . Arthritis   . CHF (congestive heart failure)     Past Surgical History  Procedure Laterality Date  . Cesarean section    . Tubal ligation      History  Smoking status  . Never Smoker   Smokeless tobacco  . Never Used    History  Alcohol Use No    Family History  Problem Relation Age of Onset  . Hypertension Mother   . Diabetes Mother   . Heart disease Mother   . Miscarriages / [place] Mother   . COPD Mother   . Arthritis Mother   . Alcohol abuse Father   . Asthma Father   . Diabetes Maternal Grandmother   . Heart disease Maternal Grandmother   . Cancer Maternal Grandfather     leukemia  . Arthritis Maternal Grandfather   . Cancer Paternal Grandmother     breast  . Cancer Paternal Grandfather  leukemia    Review of Systems: The review of systems is per the HPI.  All other systems were reviewed and are negative.  Physical Exam: BP 130/80  Pulse 77  Ht 5\' 5"  (1.651 m)  Wt 332 lb 12.8 oz (150.957 kg)  BMI 55.38 kg/m2  LMP 10/24/2012 Patient is very pleasant and in no acute distress. Skin is warm and dry. Color is normal.  HEENT is unremarkable. Normocephalic/atraumatic. PERRL. Sclera are nonicteric. Neck is supple. No masses. No JVD. Lungs are clear. Cardiac exam shows a regular rate and rhythm. Abdomen is soft. Extremities are without edema. Gait and ROM are intact. No gross neurologic deficits noted.  LABORATORY DATA: EKG today shows sinus rhythm. No acute changes.   Lab Results  Component Value Date   WBC 11.4* 11/30/2010   HGB 11.7* 11/30/2010   HCT 35.2* 11/30/2010   PLT 379 11/30/2010   GLUCOSE 100* 11/30/2010   ALT 11 11/30/2010   AST 13 11/30/2010   NA 134* 11/30/2010   K 3.6 11/30/2010   CL 100 11/30/2010   CREATININE 0.87 11/30/2010   BUN 8 11/30/2010   CO2 25 11/30/2010   TSH  0.93 12/10/2011     Assessment / Plan: 1. Pre op clearance - I think she is an acceptable candidate for her surgery. I would like to get an echo done prior.   2. HTN - BP looks ok  3. Morbid obesity   4. Non ischemic CM - I can only see Dr. Jenene Slicker notation that her EF recovered back in February 2012 - will go ahead and update.   Patient is agreeable to this plan and will call if any problems develop in the interim.   Rosalio Macadamia, RN, ANP-C [facility] [address] [place], [place]  [address]

## 2012-11-26 ENCOUNTER — Other Ambulatory Visit (HOSPITAL_COMMUNITY): Payer: Medicaid Other

## 2012-12-03 ENCOUNTER — Telehealth: Payer: Self-pay | Admitting: Cardiology

## 2012-12-03 NOTE — Telephone Encounter (Signed)
Cardiac Clearance From [facility], Faxed back With Note Of " Pt Will Need to Be Seen For clearance"  Faxed to [phone]

## 2012-12-09 ENCOUNTER — Encounter: Payer: Self-pay | Admitting: Cardiology

## 2012-12-09 ENCOUNTER — Ambulatory Visit (HOSPITAL_COMMUNITY): Payer: Medicaid Other | Attending: Cardiology | Admitting: Cardiology

## 2012-12-09 DIAGNOSIS — I10 Essential (primary) hypertension: Secondary | ICD-10-CM

## 2012-12-09 DIAGNOSIS — I079 Rheumatic tricuspid valve disease, unspecified: Secondary | ICD-10-CM | POA: Insufficient documentation

## 2012-12-09 DIAGNOSIS — Z01810 Encounter for preprocedural cardiovascular examination: Secondary | ICD-10-CM

## 2012-12-09 DIAGNOSIS — I429 Cardiomyopathy, unspecified: Secondary | ICD-10-CM

## 2012-12-09 DIAGNOSIS — Z6841 Body Mass Index (BMI) 40.0 and over, adult: Secondary | ICD-10-CM | POA: Insufficient documentation

## 2012-12-09 DIAGNOSIS — I509 Heart failure, unspecified: Secondary | ICD-10-CM | POA: Insufficient documentation

## 2012-12-09 DIAGNOSIS — I428 Other cardiomyopathies: Secondary | ICD-10-CM | POA: Insufficient documentation

## 2012-12-09 NOTE — Progress Notes (Signed)
Echo performed. 

## 2012-12-31 ENCOUNTER — Encounter (INDEPENDENT_AMBULATORY_CARE_PROVIDER_SITE_OTHER): Payer: Self-pay

## 2013-01-13 ENCOUNTER — Encounter (HOSPITAL_COMMUNITY): Payer: Self-pay | Admitting: Pharmacy Technician

## 2013-01-15 ENCOUNTER — Encounter (HOSPITAL_COMMUNITY)
Admission: RE | Admit: 2013-01-15 | Discharge: 2013-01-15 | Disposition: A | Discharge status: Home / Self Care | Payer: Medicaid Other | Source: Ambulatory Visit | Attending: Anesthesiology | Admitting: Anesthesiology

## 2013-01-15 ENCOUNTER — Encounter (HOSPITAL_COMMUNITY): Payer: Self-pay

## 2013-01-15 ENCOUNTER — Encounter (HOSPITAL_COMMUNITY)
Admission: RE | Admit: 2013-01-15 | Discharge: 2013-01-15 | Disposition: A | Discharge status: Home / Self Care | Payer: Medicaid Other | Source: Ambulatory Visit | Attending: General Surgery | Admitting: General Surgery

## 2013-01-15 DIAGNOSIS — Z01818 Encounter for other preprocedural examination: Secondary | ICD-10-CM | POA: Insufficient documentation

## 2013-01-15 DIAGNOSIS — Z01812 Encounter for preprocedural laboratory examination: Secondary | ICD-10-CM | POA: Insufficient documentation

## 2013-01-15 HISTORY — DX: Nausea: R11.0

## 2013-01-15 HISTORY — DX: Gastro-esophageal reflux disease without esophagitis: K21.9

## 2013-01-15 HISTORY — DX: Carpal tunnel syndrome, right upper limb: G56.01

## 2013-01-15 HISTORY — DX: Dorsalgia, unspecified: M54.9

## 2013-01-15 HISTORY — DX: Other chronic pain: G89.29

## 2013-01-15 HISTORY — DX: Personal history of other diseases of the nervous system and sense organs: Z86.69

## 2013-01-15 HISTORY — DX: Unspecified osteoarthritis, unspecified site: M19.90

## 2013-01-15 LAB — COMPREHENSIVE METABOLIC PANEL
ALT: 11 U/L (ref 0–35)
AST: 14 U/L (ref 0–37)
Albumin: 3.2 g/dL — ABNORMAL LOW (ref 3.5–5.2)
Alkaline Phosphatase: 52 U/L (ref 39–117)
BILIRUBIN TOTAL: 0.3 mg/dL (ref 0.3–1.2)
BUN: 7 mg/dL (ref 6–23)
CO2: 25 mEq/L (ref 19–32)
CREATININE: 0.76 mg/dL (ref 0.50–1.10)
Calcium: 8.9 mg/dL (ref 8.4–10.5)
Chloride: 101 mEq/L (ref 96–112)
GFR calc Af Amer: >90 mL/min (ref >90)
GFR calc non Af Amer: >90 mL/min (ref >90)
Glucose, Bld: 125 mg/dL — ABNORMAL HIGH (ref 70–99)
Potassium: 3.9 mEq/L (ref 3.7–5.3)
Sodium: 137 mEq/L (ref 137–147)
Total Protein: 7.3 g/dL (ref 6.0–8.3)

## 2013-01-15 LAB — CBC
HCT: 34.5 % — ABNORMAL LOW (ref 36.0–46.0)
Hemoglobin: 11.4 g/dL — ABNORMAL LOW (ref 12.0–15.0)
MCH: 26.4 pg (ref 26.0–34.0)
MCHC: 33 g/dL (ref 30.0–36.0)
MCV: 79.9 fL (ref 78.0–100.0)
Platelets: 425 10*3/uL — ABNORMAL HIGH (ref 150–400)
RBC: 4.32 MIL/uL (ref 3.87–5.11)
RDW: 14.7 % (ref 11.5–15.5)
WBC: 12.4 10*3/uL — ABNORMAL HIGH (ref 4.0–10.5)

## 2013-01-15 LAB — HCG, SERUM, QUALITATIVE: PREG SERUM: NEGATIVE

## 2013-01-15 NOTE — Progress Notes (Addendum)
Pt saw PA with [facility] in Nov 2014-report in epic  To schedule an appointment with Dr.Hochrein in Feb   Echo reports in epic from 2011 and 2014  EKG in epic from 11-25-12  Denies ever having a heart cath or stress test  Denies CXR in past year

## 2013-01-15 NOTE — Pre-Procedure Instructions (Signed)
Allison RakersSeamon M [name]  01/15/2013   Your procedure is scheduled on:  Fri, Jan 16 @ 7:30 AM  [facility]:30 AM.  Call this number if you have problems the morning of surgery: [phone]   Remember:   Do not eat food or drink liquids after midnight.   Take these medicines the morning of surgery with A SIP OF WATER: Amlodipine(Norvasc),Carvedilol(Coreg),Gabapentin(Neurontin),Omeprazole(Prilosec),and Phenergan(Promethazine-if needed)                Stop taking your Relafen and Ibuprofen. No Goody's,BC's,Aleve,Aspirin,Fish Oil,or any Herbal Medications   Do not wear jewelry, make-up or nail polish.  Do not wear lotions, powders, or perfumes. You may wear deodorant.  Do not shave 48 hours prior to surgery.   Do not bring valuables to the hospital.  [facility] is not responsible                  for any belongings or valuables.               Contacts, dentures or bridgework may not be worn into surgery.  Leave suitcase in the car. After surgery it may be brought to your room.  For patients admitted to the hospital, discharge time is determined by your                treatment team.               Patients discharged the day of surgery will not be allowed to drive  home.    Special Instructions: Shower using CHG 2 nights before surgery and the night before surgery.  If you shower the day of surgery use CHG.  Use special wash - you have one bottle of CHG for all showers.  You should use approximately 1/3 of the bottle for each shower.   Please read over the following fact sheets that you were given: Pain Booklet, Coughing and Deep Breathing and Surgical Site Infection Prevention

## 2013-01-16 ENCOUNTER — Telehealth (INDEPENDENT_AMBULATORY_CARE_PROVIDER_SITE_OTHER): Payer: Self-pay

## 2013-01-16 ENCOUNTER — Encounter (HOSPITAL_COMMUNITY): Payer: Self-pay

## 2013-01-16 NOTE — Telephone Encounter (Signed)
Called to notify them that labs are ok for surgery per Dr Andrey CampanileWilson.

## 2013-01-16 NOTE — Progress Notes (Signed)
Received called from office.  Dr Andrey CampanileWilson has seen labs and said they were OK to proceed.   DA

## 2013-01-16 NOTE — Progress Notes (Addendum)
Anesthesia Chart Review:  Patient is a 33 year old female scheduled for laparoscopic cholecystectomy on 01/23/13 by Dr. Andrey CampanileWilson. History includes morbid obesity, non-smoker, postpartum non-ischemic cardiomyopathy '11 (EF 25% at diagnosis, but normalized by f/u echo), CHF, pregnancy induced HTN, migraines, GERD, arthritis, chronic back pain, osteoarthritis.  Cardiologist is Dr. Antoine PocheHochrein.  She was seen by cardiology NP Norma FredricksonLori Gerhardt on 11/25/12 for preoperative clearance.  She was felt to be an acceptable candidate for surgery.  EKG then showed NSR.  Echo on 12/09/12 showed: - Left ventricle: The cavity size was normal. Wall thickness was increased in a pattern of mild LVH. Systolic function was normal. The estimated ejection fraction was in the range of 55% to 60%. Wall motion was normal; there were no regional wall motion abnormalities. Features are consistent with a pseudonormal left ventricular filling pattern, with concomitant abnormal relaxation and increased filling pressure (grade 2 diastolic dysfunction). - Mitral valve: Trivial regurgitation. - Pulmonic valve: Trivial regurgitation. - Tricuspid valve: Mild regurgitation. - Pericardium, extracardiac: A small pericardial effusion was identified. Impressions: Compared to study dated 02/20/10, no significant change.  Preoperative CXR and labs noted.  If no acute changes then anticipate that she can proceed as planned.  Velna Ochsllison Caylor Cerino, PA-C [facility]/Anesthesiology Phone [phone]) [phone] 01/16/2013 1:26 PM

## 2013-01-23 ENCOUNTER — Ambulatory Visit (HOSPITAL_COMMUNITY): Payer: Medicaid Other | Admitting: Anesthesiology

## 2013-01-23 ENCOUNTER — Ambulatory Visit (HOSPITAL_COMMUNITY)
Admission: RE | Admit: 2013-01-23 | Discharge: 2013-01-24 | Disposition: A | Discharge status: Home / Self Care | Payer: Medicaid Other | Source: Ambulatory Visit | Attending: General Surgery | Admitting: General Surgery

## 2013-01-23 ENCOUNTER — Encounter (HOSPITAL_COMMUNITY)
Admission: RE | Disposition: A | Discharge status: Home / Self Care | Payer: Self-pay | Source: Ambulatory Visit | Attending: General Surgery

## 2013-01-23 ENCOUNTER — Encounter (HOSPITAL_COMMUNITY): Payer: Medicaid Other | Admitting: Vascular Surgery

## 2013-01-23 ENCOUNTER — Encounter (HOSPITAL_COMMUNITY): Payer: Self-pay | Admitting: Anesthesiology

## 2013-01-23 DIAGNOSIS — K8020 Calculus of gallbladder without cholecystitis without obstruction: Secondary | ICD-10-CM

## 2013-01-23 DIAGNOSIS — Z9049 Acquired absence of other specified parts of digestive tract: Secondary | ICD-10-CM

## 2013-01-23 DIAGNOSIS — K219 Gastro-esophageal reflux disease without esophagitis: Secondary | ICD-10-CM | POA: Insufficient documentation

## 2013-01-23 DIAGNOSIS — M1990 Unspecified osteoarthritis, unspecified site: Secondary | ICD-10-CM | POA: Insufficient documentation

## 2013-01-23 DIAGNOSIS — R7301 Impaired fasting glucose: Secondary | ICD-10-CM | POA: Insufficient documentation

## 2013-01-23 DIAGNOSIS — I10 Essential (primary) hypertension: Secondary | ICD-10-CM | POA: Insufficient documentation

## 2013-01-23 DIAGNOSIS — I509 Heart failure, unspecified: Secondary | ICD-10-CM | POA: Insufficient documentation

## 2013-01-23 DIAGNOSIS — K8010 Calculus of gallbladder with chronic cholecystitis without obstruction: Secondary | ICD-10-CM | POA: Insufficient documentation

## 2013-01-23 HISTORY — PX: CHOLECYSTECTOMY: SHX55

## 2013-01-23 SURGERY — LAPAROSCOPIC CHOLECYSTECTOMY
Anesthesia: General | Site: Abdomen

## 2013-01-23 MED ORDER — ONDANSETRON HCL 4 MG/2ML IJ SOLN: 4.0000 mg | Freq: Four times a day (QID) | INTRAMUSCULAR | Status: DC | PRN

## 2013-01-23 MED ORDER — MORPHINE SULFATE 4 MG/ML IJ SOLN
INTRAMUSCULAR | Status: AC
  Administered 2013-01-23: 4 mg
  Filled 2013-01-23: qty 1

## 2013-01-23 MED ORDER — ACETAMINOPHEN 325 MG PO TABS: 650.0000 mg | ORAL_TABLET | ORAL | Status: DC | PRN

## 2013-01-23 MED ORDER — HYDROMORPHONE HCL PF 1 MG/ML IJ SOLN: 1.0000 mg | INTRAMUSCULAR | Status: DC | PRN

## 2013-01-23 MED ORDER — OXYCODONE HCL 5 MG PO TABS
5.0000 mg | ORAL_TABLET | ORAL | Status: DC | PRN
  Administered 2013-01-23: 5 mg via ORAL
  Administered 2013-01-24 (×2): 10 mg via ORAL
  Filled 2013-01-23 (×2): qty 2
  Filled 2013-01-23: qty 1

## 2013-01-23 MED ORDER — 0.9 % SODIUM CHLORIDE (POUR BTL) OPTIME
TOPICAL | Status: DC | PRN
  Administered 2013-01-23: 1000 mL

## 2013-01-23 MED ORDER — SODIUM CHLORIDE 0.9 % IR SOLN
Status: DC | PRN
  Administered 2013-01-23 (×2): 1000 mL

## 2013-01-23 MED ORDER — ONDANSETRON HCL 4 MG PO TABS: 4.0000 mg | ORAL_TABLET | Freq: Four times a day (QID) | ORAL | Status: DC | PRN

## 2013-01-23 MED ORDER — DEXTROSE 5 % IV SOLN
2.0000 g | Freq: Once | INTRAVENOUS | Status: DC
  Filled 2013-01-23: qty 2

## 2013-01-23 MED ORDER — MORPHINE SULFATE 10 MG/ML IJ SOLN
0.5000 mg/h | INTRAVENOUS | Status: DC
  Filled 2013-01-23: qty 10

## 2013-01-23 MED ORDER — FUROSEMIDE 40 MG PO TABS
40.0000 mg | ORAL_TABLET | Freq: Every day | ORAL | Status: DC
  Administered 2013-01-24: 40 mg via ORAL
  Filled 2013-01-23: qty 1

## 2013-01-23 MED ORDER — ROCURONIUM BROMIDE 100 MG/10ML IV SOLN
INTRAVENOUS | Status: DC | PRN
  Administered 2013-01-23: 10 mg via INTRAVENOUS
  Administered 2013-01-23: 50 mg via INTRAVENOUS

## 2013-01-23 MED ORDER — PROPOFOL 10 MG/ML IV BOLUS
INTRAVENOUS | Status: DC | PRN
  Administered 2013-01-23: 40 mg via INTRAVENOUS
  Administered 2013-01-23: 200 mg via INTRAVENOUS

## 2013-01-23 MED ORDER — MIDAZOLAM HCL 5 MG/5ML IJ SOLN
INTRAMUSCULAR | Status: DC | PRN
  Administered 2013-01-23: 2 mg via INTRAVENOUS

## 2013-01-23 MED ORDER — NEOSTIGMINE METHYLSULFATE 1 MG/ML IJ SOLN
INTRAMUSCULAR | Status: DC | PRN
  Administered 2013-01-23: 5 mg via INTRAVENOUS

## 2013-01-23 MED ORDER — FENTANYL CITRATE 0.05 MG/ML IJ SOLN
INTRAMUSCULAR | Status: DC | PRN
  Administered 2013-01-23: 50 ug via INTRAVENOUS
  Administered 2013-01-23: 25 ug via INTRAVENOUS
  Administered 2013-01-23: 50 ug via INTRAVENOUS
  Administered 2013-01-23: 25 ug via INTRAVENOUS
  Administered 2013-01-23: 100 ug via INTRAVENOUS

## 2013-01-23 MED ORDER — SODIUM CHLORIDE 0.9 % IJ SOLN
3.0000 mL | Freq: Two times a day (BID) | INTRAMUSCULAR | Status: DC
  Administered 2013-01-24: 3 mL via INTRAVENOUS

## 2013-01-23 MED ORDER — SUCCINYLCHOLINE CHLORIDE 20 MG/ML IJ SOLN
INTRAMUSCULAR | Status: DC | PRN
  Administered 2013-01-23: 120 mg via INTRAVENOUS

## 2013-01-23 MED ORDER — POTASSIUM CHLORIDE IN NACL 40-0.9 MEQ/L-% IV SOLN
INTRAVENOUS | Status: DC
  Administered 2013-01-23 – 2013-01-24 (×2): via INTRAVENOUS
  Filled 2013-01-23 (×5): qty 1000

## 2013-01-23 MED ORDER — SODIUM CHLORIDE 0.9 % IV SOLN
INTRAVENOUS | Status: DC | PRN
  Administered 2013-01-23: 08:00:00

## 2013-01-23 MED ORDER — ACETAMINOPHEN 650 MG RE SUPP: 650.0000 mg | RECTAL | Status: DC | PRN

## 2013-01-23 MED ORDER — DEXTROSE 5 % IV SOLN
2.0000 g | INTRAVENOUS | Status: DC | PRN
  Administered 2013-01-23: 2 g via INTRAVENOUS

## 2013-01-23 MED ORDER — PHENYLEPHRINE HCL 10 MG/ML IJ SOLN
INTRAMUSCULAR | Status: DC | PRN
  Administered 2013-01-23: 120 ug via INTRAVENOUS
  Administered 2013-01-23: 80 ug via INTRAVENOUS

## 2013-01-23 MED ORDER — CARVEDILOL 12.5 MG PO TABS
12.5000 mg | ORAL_TABLET | Freq: Two times a day (BID) | ORAL | Status: DC
  Administered 2013-01-23 – 2013-01-24 (×2): 12.5 mg via ORAL
  Filled 2013-01-23 (×4): qty 1

## 2013-01-23 MED ORDER — SODIUM CHLORIDE 0.9 % IV SOLN: 250.0000 mL | INTRAVENOUS | Status: DC | PRN

## 2013-01-23 MED ORDER — LIDOCAINE HCL (CARDIAC) 20 MG/ML IV SOLN
INTRAVENOUS | Status: DC | PRN
  Administered 2013-01-23: 70 mg via INTRAVENOUS

## 2013-01-23 MED ORDER — OXYCODONE HCL 5 MG PO TABS
ORAL_TABLET | ORAL | Status: AC
  Filled 2013-01-23: qty 1

## 2013-01-23 MED ORDER — HYDROMORPHONE HCL PF 1 MG/ML IJ SOLN
INTRAMUSCULAR | Status: AC
  Filled 2013-01-23: qty 1

## 2013-01-23 MED ORDER — LACTATED RINGERS IV SOLN
INTRAVENOUS | Status: DC | PRN
Start: 2013-01-23 — End: 2013-01-23
  Administered 2013-01-23 (×2): via INTRAVENOUS

## 2013-01-23 MED ORDER — PROMETHAZINE HCL 25 MG/ML IJ SOLN: 6.2500 mg | INTRAMUSCULAR | Status: DC | PRN

## 2013-01-23 MED ORDER — PANTOPRAZOLE SODIUM 40 MG PO TBEC
40.0000 mg | DELAYED_RELEASE_TABLET | Freq: Every day | ORAL | Status: DC
  Administered 2013-01-23 – 2013-01-24 (×2): 40 mg via ORAL
  Filled 2013-01-23 (×2): qty 1

## 2013-01-23 MED ORDER — ONDANSETRON HCL 4 MG/2ML IJ SOLN
INTRAMUSCULAR | Status: DC | PRN
  Administered 2013-01-23 (×2): 4 mg via INTRAVENOUS

## 2013-01-23 MED ORDER — AMLODIPINE BESYLATE 5 MG PO TABS
5.0000 mg | ORAL_TABLET | Freq: Every day | ORAL | Status: DC
  Administered 2013-01-24: 5 mg via ORAL
  Filled 2013-01-23: qty 1

## 2013-01-23 MED ORDER — HYDROMORPHONE HCL PF 1 MG/ML IJ SOLN
0.2500 mg | INTRAMUSCULAR | Status: DC | PRN
  Administered 2013-01-23: 0.5 mg via INTRAVENOUS
  Administered 2013-01-23 (×2): 0.25 mg via INTRAVENOUS
  Administered 2013-01-23: 0.5 mg via INTRAVENOUS
  Administered 2013-01-23 (×2): 0.25 mg via INTRAVENOUS

## 2013-01-23 MED ORDER — ENALAPRIL MALEATE 10 MG PO TABS
10.0000 mg | ORAL_TABLET | Freq: Every day | ORAL | Status: DC
Start: 2013-01-24 — End: 2013-01-24
  Administered 2013-01-24: 10 mg via ORAL
  Filled 2013-01-23: qty 1

## 2013-01-23 MED ORDER — BUPIVACAINE-EPINEPHRINE 0.25% -1:200000 IJ SOLN
INTRAMUSCULAR | Status: DC | PRN
  Administered 2013-01-23: 14 mL

## 2013-01-23 MED ORDER — MORPHINE SULFATE 2 MG/ML IJ SOLN
1.0000 mg | INTRAMUSCULAR | Status: DC | PRN
  Administered 2013-01-23 – 2013-01-24 (×2): 2 mg via INTRAVENOUS
  Filled 2013-01-23 (×2): qty 1

## 2013-01-23 MED ORDER — SODIUM CHLORIDE 0.9 % IJ SOLN
3.0000 mL | INTRAMUSCULAR | Status: DC | PRN
  Administered 2013-01-24: 3 mL via INTRAVENOUS

## 2013-01-23 MED ORDER — OXYCODONE HCL 5 MG/5ML PO SOLN: 5.0000 mg | Freq: Once | ORAL | Status: AC | PRN

## 2013-01-23 MED ORDER — OXYCODONE HCL 5 MG PO TABS
5.0000 mg | ORAL_TABLET | Freq: Once | ORAL | Status: AC | PRN
  Administered 2013-01-23: 5 mg via ORAL

## 2013-01-23 MED ORDER — OXYCODONE-ACETAMINOPHEN 5-325 MG PO TABS: 1.0000 | ORAL_TABLET | ORAL | Status: DC | PRN

## 2013-01-23 MED ORDER — BUPIVACAINE-EPINEPHRINE (PF) 0.25% -1:200000 IJ SOLN
INTRAMUSCULAR | Status: AC
  Filled 2013-01-23: qty 30

## 2013-01-23 MED ORDER — GLYCOPYRROLATE 0.2 MG/ML IJ SOLN
INTRAMUSCULAR | Status: DC | PRN
  Administered 2013-01-23: 1 mg via INTRAVENOUS

## 2013-01-23 MED ORDER — ENOXAPARIN SODIUM 40 MG/0.4ML ~~LOC~~ SOLN
40.0000 mg | SUBCUTANEOUS | Status: DC
  Administered 2013-01-24: 40 mg via SUBCUTANEOUS
  Filled 2013-01-23 (×2): qty 0.4

## 2013-01-23 MED ORDER — NABUMETONE 500 MG PO TABS: 500.0000 mg | ORAL_TABLET | Freq: Two times a day (BID) | ORAL | Status: DC

## 2013-01-23 MED ORDER — ORPHENADRINE CITRATE ER 100 MG PO TB12
100.0000 mg | ORAL_TABLET | Freq: Two times a day (BID) | ORAL | Status: DC
  Administered 2013-01-23 – 2013-01-24 (×2): 100 mg via ORAL
  Filled 2013-01-23 (×4): qty 1

## 2013-01-23 MED ORDER — GABAPENTIN 300 MG PO CAPS
300.0000 mg | ORAL_CAPSULE | Freq: Two times a day (BID) | ORAL | Status: DC
  Administered 2013-01-23 – 2013-01-24 (×2): 300 mg via ORAL
  Filled 2013-01-23 (×3): qty 1

## 2013-01-23 MED ORDER — KETOROLAC TROMETHAMINE 30 MG/ML IJ SOLN
30.0000 mg | Freq: Four times a day (QID) | INTRAMUSCULAR | Status: DC
  Administered 2013-01-23 – 2013-01-24 (×3): 30 mg via INTRAVENOUS
  Filled 2013-01-23 (×7): qty 1

## 2013-01-23 SURGICAL SUPPLY — 55 items
APL SKNCLS STERI-STRIP NONHPOA (GAUZE/BANDAGES/DRESSINGS) ×2
APPLIER CLIP 5 13 M/L LIGAMAX5 (MISCELLANEOUS) ×4
APR CLP MED LRG 5 ANG JAW (MISCELLANEOUS) ×2
BAG SPEC RTRVL LRG 6X4 10 (ENDOMECHANICALS) ×2
BANDAGE ADHESIVE 1X3 (GAUZE/BANDAGES/DRESSINGS) ×18 IMPLANT
BENZOIN TINCTURE PRP APPL 2/3 (GAUZE/BANDAGES/DRESSINGS) ×4 IMPLANT
BLADE SURG ROTATE 9660 (MISCELLANEOUS) IMPLANT
CANISTER SUCTION 2500CC (MISCELLANEOUS) ×4 IMPLANT
CHLORAPREP W/TINT 26ML (MISCELLANEOUS) ×4 IMPLANT
CLIP APPLIE 5 13 M/L LIGAMAX5 (MISCELLANEOUS) ×2 IMPLANT
COVER MAYO STAND STRL (DRAPES) ×4 IMPLANT
COVER SURGICAL LIGHT HANDLE (MISCELLANEOUS) ×4 IMPLANT
DECANTER SPIKE VIAL GLASS SM (MISCELLANEOUS) ×4 IMPLANT
DEVICE TROCAR PUNCTURE CLOSURE (ENDOMECHANICALS) ×3 IMPLANT
DRAPE C-ARM 42X72 X-RAY (DRAPES) ×4 IMPLANT
DRAPE UTILITY 15X26 W/TAPE STR (DRAPE) ×8 IMPLANT
DRSG TEGADERM 4X4.75 (GAUZE/BANDAGES/DRESSINGS) ×4 IMPLANT
ELECT CAUTERY BLADE 6.4 (BLADE) ×3 IMPLANT
ELECT REM PT RETURN 9FT ADLT (ELECTROSURGICAL) ×4
ELECTRODE REM PT RTRN 9FT ADLT (ELECTROSURGICAL) ×2 IMPLANT
GAUZE SPONGE 2X2 8PLY STRL LF (GAUZE/BANDAGES/DRESSINGS) ×3 IMPLANT
GLOVE BIO SURGEON STRL SZ 6.5 (GLOVE) ×2 IMPLANT
GLOVE BIO SURGEON STRL SZ7.5 (GLOVE) ×3 IMPLANT
GLOVE BIO SURGEONS STRL SZ 6.5 (GLOVE) ×1
GLOVE BIOGEL M STRL SZ7.5 (GLOVE) ×4 IMPLANT
GLOVE BIOGEL PI IND STRL 7.5 (GLOVE) ×2 IMPLANT
GLOVE BIOGEL PI IND STRL 8 (GLOVE) ×2 IMPLANT
GLOVE BIOGEL PI INDICATOR 7.5 (GLOVE) ×4
GLOVE BIOGEL PI INDICATOR 8 (GLOVE) ×2
GLOVE EUDERMIC 7 POWDERFREE (GLOVE) ×3 IMPLANT
GLOVE SURG SS PI 7.5 STRL IVOR (GLOVE) ×3 IMPLANT
GOWN STRL NON-REIN LRG LVL3 (GOWN DISPOSABLE) ×12 IMPLANT
GOWN STRL REIN XL XLG (GOWN DISPOSABLE) ×4 IMPLANT
KIT BASIN OR (CUSTOM PROCEDURE TRAY) ×4 IMPLANT
KIT ROOM TURNOVER OR (KITS) ×4 IMPLANT
NDL SPNL 22GX3.5 QUINCKE BK (NEEDLE) IMPLANT
NEEDLE SPNL 22GX3.5 QUINCKE BK (NEEDLE) ×4 IMPLANT
NS IRRIG 1000ML POUR BTL (IV SOLUTION) ×4 IMPLANT
PAD ARMBOARD 7.5X6 YLW CONV (MISCELLANEOUS) ×4 IMPLANT
PENCIL BUTTON HOLSTER BLD 10FT (ELECTRODE) ×3 IMPLANT
POUCH SPECIMEN RETRIEVAL 10MM (ENDOMECHANICALS) ×4 IMPLANT
SCISSORS LAP 5X35 DISP (ENDOMECHANICALS) ×4 IMPLANT
SET CHOLANGIOGRAPH 5 50 .035 (SET/KITS/TRAYS/PACK) ×4 IMPLANT
SET IRRIG TUBING LAPAROSCOPIC (IRRIGATION / IRRIGATOR) ×4 IMPLANT
SLEEVE ENDOPATH XCEL 5M (ENDOMECHANICALS) ×8 IMPLANT
SPECIMEN JAR SMALL (MISCELLANEOUS) ×4 IMPLANT
SPONGE GAUZE 2X2 STER 10/PKG (GAUZE/BANDAGES/DRESSINGS) ×4
SUT MNCRL AB 4-0 PS2 18 (SUTURE) ×4 IMPLANT
TOWEL OR 17X24 6PK STRL BLUE (TOWEL DISPOSABLE) ×4 IMPLANT
TOWEL OR 17X26 10 PK STRL BLUE (TOWEL DISPOSABLE) ×4 IMPLANT
TRAY LAPAROSCOPIC (CUSTOM PROCEDURE TRAY) ×4 IMPLANT
TROCAR 5M 150ML BLDLS (TROCAR) ×3 IMPLANT
TROCAR XCEL 12X100 BLDLESS (ENDOMECHANICALS) ×6 IMPLANT
TROCAR XCEL BLUNT TIP 100MML (ENDOMECHANICALS) ×4 IMPLANT
TROCAR XCEL NON-BLD 5MMX100MML (ENDOMECHANICALS) ×4 IMPLANT

## 2013-01-23 NOTE — Preoperative (Signed)
Beta Blockers   Reason not to administer Beta Blockers:Not Applicable 

## 2013-01-23 NOTE — Brief Op Note (Signed)
01/23/2013  9:41 AM  PATIENT:  Allison RakersSeamon M [name]  33 y.o. female  PRE-OPERATIVE DIAGNOSIS:  Symptomatic cholelithiasis   POST-OPERATIVE DIAGNOSIS:  same  PROCEDURE:  Procedure(s): LAPAROSCOPIC CHOLECYSTECTOMY (N/A)  SURGEON:  Surgeon(s) and Role:    * Atilano InaEric M Noriel Guthrie, MD - Primary  PHYSICIAN ASSISTANT: none  ASSISTANTS: Claud KelpHaywood Ingram, MD   ANESTHESIA:   general  EBL:  Total I/O In: 1000 [I.V.:1000] Out: -   BLOOD ADMINISTERED:none  DRAINS: none   LOCAL MEDICATIONS USED:  MARCAINE     SPECIMEN:  Source of Specimen:  gallbladder  DISPOSITION OF SPECIMEN:  PATHOLOGY  COUNTS:  YES  TOURNIQUET:  * No tourniquets in log *  DICTATION: .Other Dictation: Dictation Number [id number]  PLAN OF CARE: Discharge to home after PACU  PATIENT DISPOSITION:  PACU - hemodynamically stable.   Delay start of Pharmacological VTE agent (>24hrs) due to surgical blood loss or risk of bleeding: not applicable

## 2013-01-23 NOTE — Anesthesia Postprocedure Evaluation (Signed)
  Anesthesia Post-op Note  Patient: Allison [name]  Procedure(s) Performed: Procedure(s): LAPAROSCOPIC CHOLECYSTECTOMY (N/A)  Patient Location: PACU  Anesthesia Type:General  Level of Consciousness: awake and sedated  Airway and Oxygen Therapy: Patient Spontanous Breathing  Post-op Pain: mild  Post-op Assessment: Post-op Vital signs reviewed  Post-op Vital Signs: stable  Complications: No apparent anesthesia complications

## 2013-01-23 NOTE — Anesthesia Procedure Notes (Signed)
Procedure Name: Intubation Date/Time: 01/23/2013 7:53 AM Performed by: Whitman HeroVITSHTEYN, Tadarrius Burch Pre-anesthesia Checklist: Patient identified, Emergency Drugs available, Suction available and Patient being monitored Patient Re-evaluated:Patient Re-evaluated prior to inductionOxygen Delivery Method: Circle system utilized Preoxygenation: Pre-oxygenation with 100% oxygen Intubation Type: IV induction and Cricoid Pressure applied Ventilation: Two handed mask ventilation required and Mask ventilation without difficulty Laryngoscope Size: Mac and 3 Grade View: Grade I Tube size: 7.0 mm Number of attempts: 1 Airway Equipment and Method: Video-laryngoscopy and Stylet Placement Confirmation: ETT inserted through vocal cords under direct vision,  positive ETCO2 and breath sounds checked- equal and bilateral Secured at: 20 cm Tube secured with: Tape Dental Injury: Teeth and Oropharynx as per pre-operative assessment  Difficulty Due To: Difficulty was anticipated and Difficult Airway-  due to edematous airway Future Recommendations: Recommend- induction with short-acting agent, and alternative techniques readily available

## 2013-01-23 NOTE — Anesthesia Preprocedure Evaluation (Signed)
Anesthesia Evaluation  Patient identified by MRN, date of birth, ID band Patient awake    Reviewed: Allergy & Precautions, H&P , NPO status   History of Anesthesia Complications Negative for: history of anesthetic complications  Airway Mallampati: II      Dental   Pulmonary shortness of breath,          Cardiovascular hypertension, Rhythm:Regular Rate:Normal     Neuro/Psych    GI/Hepatic GERD-  ,  Endo/Other  Morbid obesity  Renal/GU      Musculoskeletal   Abdominal   Peds  Hematology   Anesthesia Other Findings   Reproductive/Obstetrics                           Anesthesia Physical Anesthesia Plan  ASA: III  Anesthesia Plan: General   Post-op Pain Management:    Induction: Intravenous  Airway Management Planned: Oral ETT  Additional Equipment:   Intra-op Plan:   Post-operative Plan: Extubation in OR  Informed Consent: I have reviewed the patients History and Physical, chart, labs and discussed the procedure including the risks, benefits and alternatives for the proposed anesthesia with the patient or authorized representative who has indicated his/her understanding and acceptance.   Dental advisory given  Plan Discussed with: CRNA and Surgeon  Anesthesia Plan Comments:         Anesthesia Quick Evaluation

## 2013-01-23 NOTE — Discharge Instructions (Signed)
[facility] LAPAROSCOPIC SURGERY: POST OP INSTRUCTIONS Always review your discharge instruction sheet given to you by the facility where your surgery was performed. IF YOU HAVE DISABILITY OR FAMILY LEAVE FORMS, YOU MUST BRING THEM TO THE OFFICE FOR PROCESSING.   DO NOT GIVE THEM TO YOUR DOCTOR.  1. A prescription for pain medication may be given to you upon discharge.  Take your pain medication as prescribed, if needed.  If narcotic pain medicine is not needed, then you may take acetaminophen (Tylenol) or ibuprofen (Advil) as needed. 2. Take your usually prescribed medications unless otherwise directed. 3. If you need a refill on your pain medication, please contact your pharmacy.  They will contact our office to request authorization. Prescriptions will not be filled after 5pm or on week-ends. 4. You should follow a light diet the first few days after arrival home, such as soup and crackers, etc.  Be sure to include lots of fluids daily. 5. Most patients will experience some swelling and bruising in the area of the incisions.  Ice packs will help.  Swelling and bruising can take several days to resolve.  6. It is common to experience some constipation if taking pain medication after surgery.  Increasing fluid intake and taking a stool softener (such as Colace) will usually help or prevent this problem from occurring.  A mild laxative (Milk of Magnesia or Miralax) should be taken according to package instructions if there are no bowel movements after 48 hours. 7. Unless discharge instructions indicate otherwise, you may remove your bandages 48 hours after surgery, and you may shower at that time.  You have steri-strips (small skin tapes) in place directly over the incision.  These strips should be left on the skin for 7-10 days.  .  Any sutures or staples will be removed at the office during your follow-up visit. 8. ACTIVITIES:  You may resume regular (light) daily activities  beginning the next day--such as daily self-care, walking, climbing stairs--gradually increasing activities as tolerated.  You may have sexual intercourse when it is comfortable.  Refrain from any heavy lifting or straining until approved by your doctor. a. You may drive when you are no longer taking prescription pain medication, you can comfortably wear a seatbelt, and you can safely maneuver your car and apply brakes. 9. You should see your doctor in the office for a follow-up appointment approximately 2-3 weeks after your surgery.  Make sure that you call for this appointment within a day or two after you arrive home to insure a convenient appointment time. 10. OTHER INSTRUCTIONS:  WHEN TO CALL YOUR DOCTOR: 1. Fever over 101.0 2. Inability to urinate 3. Continued bleeding from incision. 4. Increased pain, redness, or drainage from the incision. 5. Increasing abdominal pain  The clinic staff is available to answer your questions during regular business hours.  Please dont hesitate to call and ask to speak to one of the nurses for clinical concerns.  If you have a medical emergency, go to the nearest emergency room or call 911.  A surgeon from [facility] is always on call at the hospital. [address], [address], [place], [place]  [id number] ? [address] [address], [place], [place]   [id number] [phone] ? [phone] ? [phone]) [phone] Web site: [email or web address]   General Anesthesia, Adult, Care After  Refer to this sheet in the next few weeks. These instructions provide you with information on caring for yourself after your procedure. Your health  care provider may also give you more specific instructions. Your treatment has been planned according to current medical practices, but problems sometimes occur. Call your health care provider if you have any problems or questions after your procedure.  WHAT TO EXPECT AFTER THE PROCEDURE  After the procedure, it is  typical to experience:  Sleepiness.  Nausea and vomiting. HOME CARE INSTRUCTIONS  For the first 24 hours after general anesthesia:  Have a responsible person with you.  Do not drive a car. If you are alone, do not take public transportation.  Do not drink alcohol.  Do not take medicine that has not been prescribed by your health care provider.  Do not sign important papers or make important decisions.  You may resume a normal diet and activities as directed by your health care provider.  Change bandages (dressings) as directed.  If you have questions or problems that seem related to general anesthesia, call the hospital and ask for the anesthetist or anesthesiologist on call. SEEK MEDICAL CARE IF:  You have nausea and vomiting that continue the day after anesthesia.  You develop a rash. SEEK IMMEDIATE MEDICAL CARE IF:  You have difficulty breathing.  You have chest pain.  You have any allergic problems. Document Released: 04/02/2000 Document Revised: 08/27/2012 Document Reviewed: 07/10/2012  [facility] Patient Information 2014 [place], [place].   What to eat:  For your first meals, you should eat lightly; only small meals initially.  If you do not have nausea, you may eat larger meals.  Avoid spicy, greasy and heavy food.

## 2013-01-23 NOTE — Transfer of Care (Signed)
Immediate Anesthesia Transfer of Care Note  Patient: Allison [name]  Procedure(s) Performed: Procedure(s): LAPAROSCOPIC CHOLECYSTECTOMY (N/A)  Patient Location: PACU  Anesthesia Type:General  Level of Consciousness: awake, alert  and patient cooperative  Airway & Oxygen Therapy: Patient Spontanous Breathing  Post-op Assessment: Report given to PACU RN and Post -op Vital signs reviewed and stable  Post vital signs: Reviewed and stable  Complications: No apparent anesthesia complications

## 2013-01-23 NOTE — H&P (Signed)
Allison [name] is an 33 y.o. female.   Chief Complaint: here for surgery HPI: pt comes in today for surgery. No changes in history per pt. No trips to ED/hospital. Still with intermittent symptoms  01/23/13 33 yo morbidly obese AAF referred by Dr Jackie Plum For evaluation of gallbladder problems. She states that she has had gallbladder problems for the past 12 years; however, it only really started bothering her about a month and half ago. She describes epigastric discomfort radiating up to her right upper quadrant and into her back. Will generally last several hours. She will often get bloated and nauseous with it. She denies any emesis. She denies any NSAID use. She denies any acholic stools. She reports daily bowel movements. She denies any unplanned weight loss. She initially was able to modify her diet and her symptoms improved however over the past several weeks even with eating baked foods the pain is now occurring with all oral intake. As a result she reports a diminished appetite. Pain medication helps with the discomfort. She denies any melena or hematochezia. She denies hematemesis   Past Medical History  Diagnosis Date  . Migraines   . Gallstones   . 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 Morbid obesity   . Arthritis   . GERD (gastroesophageal reflux disease)     takes Omeprazole daily  . Nausea     takes Phenergan as needed  . CHF (congestive heart failure)     takes Furosemide daily  . PIH (pregnancy induced hypertension)     takes Coreg,Enalapril,and Amlodipine daily  . Shortness of breath     with exertion  . History of bronchitis     37yrs ago  . History of migraine     last one 2 days ago  . Carpal tunnel syndrome of right wrist   . Osteoarthritis     takes Relafen daily  . Chronic back pain     Past Surgical History  Procedure Laterality Date  . Cesarean section  2011  . Tubal ligation  2011    Family History  Problem Relation Age of Onset    . Hypertension Mother   . Diabetes Mother   . Heart disease Mother   . Miscarriages / [place] Mother   . COPD Mother   . Arthritis Mother   . Alcohol abuse Father   . Asthma Father   . Diabetes Maternal Grandmother   . Heart disease Maternal Grandmother   . Cancer Maternal Grandfather     leukemia  . Arthritis Maternal Grandfather   . Cancer Paternal Grandmother     breast  . Cancer Paternal Grandfather     leukemia   Social History:  reports that she has never smoked. She has never used smokeless tobacco. She reports that she does not drink alcohol or use illicit drugs.  Allergies: No Known Allergies  Medications Prior to Admission  Medication Sig Dispense Refill  . AMLODIPINE BESYLATE PO Take 5 mg by mouth daily.      . carvedilol (COREG) 12.5 MG tablet Take 12.5 mg by mouth 2 (two) times daily with a meal.        . cholecalciferol (VITAMIN D) 1000 UNITS tablet Take 1,000 Units by mouth daily.      . enalapril (VASOTEC) 10 MG tablet Take 10 mg by mouth daily.        . furosemide (LASIX) 40 MG tablet Take 40 mg by mouth daily.        Marland Kitchen  gabapentin (NEURONTIN) 300 MG capsule Take 300 mg by mouth 2 (two) times daily.      Marland Kitchen. ibuprofen (ADVIL,MOTRIN) 200 MG tablet Take 400 mg by mouth every 4 (four) hours as needed. For pain       . nabumetone (RELAFEN) 500 MG tablet Take 500 mg by mouth 2 (two) times daily.      Marland Kitchen. omeprazole (PRILOSEC) 20 MG capsule Take 20 mg by mouth 2 (two) times daily.      . orphenadrine (NORFLEX) 100 MG tablet Take 100 mg by mouth 2 (two) times daily.      . promethazine (PHENERGAN) 25 MG tablet Take 25 mg by mouth every 6 (six) hours as needed for nausea.        No results found for this or any previous visit (from the past 48 hour(s)). No results found.  Review of Systems  Constitutional: Negative for fever, chills and weight loss.  HENT: Negative for nosebleeds.   Eyes: Negative for blurred vision.  Respiratory: Negative for shortness of  breath.   Cardiovascular: Positive for orthopnea. Negative for chest pain, palpitations, leg swelling and PND.       Some DOE; saw cardiology and cleared  Genitourinary: Negative for dysuria and hematuria.  Musculoskeletal: Negative.   Skin: Negative for itching and rash.  Neurological: Negative for dizziness, focal weakness, seizures, loss of consciousness and headaches.       Denies TIAs, amaurosis fugax  Endo/Heme/Allergies: Does not bruise/bleed easily.  Psychiatric/Behavioral: The patient is not nervous/anxious.     Blood pressure 130/66, pulse 83, temperature 97.7 F (36.5 C), temperature source Oral, resp. rate 20, last menstrual period 01/17/2013, SpO2 99.00%. Physical Exam  Vitals reviewed. Constitutional: She is oriented to person, place, and time. She appears well-developed and well-nourished. No distress.  Morbidly obese  HENT:  Head: Normocephalic and atraumatic.  Right Ear: External ear normal.  Left Ear: External ear normal.  Eyes: Conjunctivae are normal. No scleral icterus.  Neck: Neck supple. No tracheal deviation present.  Cardiovascular: Normal rate.   Respiratory: Effort normal. No stridor. No respiratory distress.  GI: Soft. She exhibits no distension. There is no tenderness. There is no rebound and no guarding.    Musculoskeletal: She exhibits no edema.  Neurological: She is alert and oriented to person, place, and time. She exhibits normal muscle tone.  Skin: Skin is warm and dry. No rash noted. She is not diaphoretic. No erythema. No pallor.  Psychiatric: She has a normal mood and affect. Her behavior is normal. Judgment and thought content normal.     Assessment/Plan Morbid obesity BMI 54  Symptomatic cholelithiasis  CHF  HTN  Snoring  GERD  Impaired fasting glucose   To OR for lap chole with ioc All questions asked and answered  Mary SellaEric M. Andrey CampanileWilson, MD, FACS General, Bariatric, & Minimally Invasive Surgery [facility],  [place]   [facility] CenterWILSON,Breckin Savannah M 01/23/2013, 7:24 AM

## 2013-01-24 MED ORDER — OXYCODONE-ACETAMINOPHEN 5-325 MG PO TABS: 1.0000 | ORAL_TABLET | ORAL | Status: DC | PRN

## 2013-01-24 NOTE — Op Note (Signed)
NAMMarland Kitchen:  Abagail KitchensJACKSON, Lavonda              ACCOUNT NO.:  [id number]  MEDICAL RECORD NO.:  [id number]  LOCATION:  6N20C                        FACILITY:  [facility]  PHYSICIAN:  Mary Sellaric M. Andrey CampanileWilson, MD, FACSDATE OF BIRTH:  1980/10/31  DATE OF PROCEDURE:  01/23/2013 DATE OF DISCHARGE:                              OPERATIVE REPORT   PREOPERATIVE DIAGNOSIS:  Symptomatic cholelithiasis.  POSTOPERATIVE DIAGNOSIS:  Symptomatic cholelithiasis.  PROCEDURE:  Laparoscopic cholecystectomy.  SURGEON:  Mary Sellaric M. Andrey CampanileWilson, MD, FACS  ASSISTANT SURGEON:  Angelia MouldHaywood M. Derrell LollingIngram, M.D.  ANESTHESIA:  General.  ESTIMATED BLOOD LOSS:  Minimal.  SPECIMEN:  Gallbladder in pieces.  INDICATIONS FOR PROCEDURE:  The patient is a morbidly obese African American female who has had been having epigastric pain radiating to her right upper quadrant for several months.  The discomfort lasted for several hours and it was associated with nausea and bloating.  She had an ultrasound which demonstrated gallstones.  We discussed risks and benefits of surgery including, but not limited to, bleeding, infection, injury to surrounding structures, need to convert to an open procedure, bile leak, prolonged diarrhea, blood clot formation, anesthesia complications, injury to common bile duct, need to convert to an open procedure.  Because of her comorbidities and her morbid obesity, she has got a cardiology clearance.  DESCRIPTION OF PROCEDURE:  After obtaining informed consent, the patient was taken [facility] the operating room [facility]  She was placed supine on the operating table.  General endotracheal anesthesia was established.  Abdomen was prepped and draped in the usual standard surgical fashion.  Because of her upper midline incision from her cesarean section, as well as her morbid obesity, I elected to gain entry to her abdominal cavity using the Optiview technique.  The surgical time- out had been performed.  She received IV  antibiotics prior to skin incision.  Sequential compression devices had been placed.  The small 1 cm incision was made in the left upper quadrant 2 fingerbreadths below the left subcostal margin.  Then, using a 5-mm Optiview trocar with a 5- mm 0 degree laparoscope advanced through all layers of the abdominal cavity, entered the abdominal cavity.  Pneumoperitoneum was smoothly established up to a pressure of 15 mmHg.  The laparoscope was advanced and the abdominal cavity was surveilled.  There was no evidence of injury to surrounding structures.  The patient had upper midline omental adhesions from her prior C-section.  However, we were able to navigate around those adhesions into the right upper quadrant.  Two 5-mm trocars were placed in the right abdomen under direct visualization.  The patient was placed in a reverse Trendelenburg.  Putting the camera in the mid right abdominal trocar, used EndoShears to lyse the adhesions sharply with EndoShears.  We achieved working area.  A 12-mm trocar was placed in the upper midline under direct visualization.  Because of the patient's morbid obesity with a BMI of 54, we did have a lot of working room. Therefore, I added an additional 5-mm trocar in the left mid abdomen, that way I could use an atraumatic grasper to push down on the omentum and colon in order to have more working room around the  gallbladder. The gallbladder was grasped and retracted toward the right shoulder. She did have a large liver.  The infundibulum was grasped, retracted laterally.  The peritoneum overlying the gallbladder was incised both medially and laterally with hook electrocautery.  The cystic duct was circumferentially dissected out and around with a Art gallery manager. The cystic artery was identified and also circumferentially dissected out around.  The patient had a short cystic duct and given the small working room, I elected not to do a cholangiogram.  We  confirmed that these were the only 2 structures entering the gallbladder and the critical view had been achieved.  Three clips were placed on the downside of the cystic duct and one on the side as it entered the gallbladder was then transected with EndoShears.  Two clips were placed on the downside of the cystic artery, one as it entered the gallbladder was then transected with EndoShears.  We then rolled the gallbladder up at the gallbladder fossa.  Because of the large nature of the liver, it was difficult to get adequate retraction on the gallbladder mobilizing it at this point.  There was some spillage of bile and gallstones in the abdominal cavity.  The gallbladder was then eventually freed.  I up- sized the left upper quadrant trocar to a 12-mm trocar.  The gallbladder was placed in an EndoCatch bag and left within the abdomen.  At this point, we started extracting the stones that had spilled.  These were manually extracted with the stone grasper.  We also flooded the right upper quadrant with saline and retrieved the stones with suction as well as graspers.  It appeared that there were no retained stones.  We then able to visualize the gallbladder fossa and obtained hemostasis.  I then continued to irrigate the right upper quadrant until it was clear.  No additional stones were visualized.  I then attempted to extract the gallbladder along with the back through the left upper quadrant trocar site had been enlarged with a 12-mm trocar.  Ended up enlarging the skin incision, an opening up the fascia in order to extract the gallbladder, even with opening up the bag and opening up the gallbladder extracting some of the stones still had to open over the fascial incision in order to extract the specimen.  At this point, I closed the fascial defect with interrupted 0 Vicryl with an Endoclose device.  The fascia was well approximated.  There was no evidence of bleeding and hemostasis had  been achieved.  I removed the remaining trocars and pneumoperitoneum was released.  The left upper quadrant trocar site that had been enlarged was closed in the deep layer with 0 Vicryl, and then all the skin incisions were closed with a 4-0 Monocryl subcuticular fashion followed by the application of benzoin, Steri-Strips, and sterile dressings.  The patient was extubated and taken to recovery in stable addition.  There were no immediate complications.  The patient tolerated the procedure well.  All needle, instrument, and sponge counts were correct x2.     Mary Sella. Andrey Campanile, MD, FACS     EMW/MEDQ  D:  01/23/2013  T:  01/24/2013  Job:  [id number]  cc:   Jackie Plum, M.D.

## 2013-01-24 NOTE — Progress Notes (Signed)
Patient discharged to home with instructions. 

## 2013-01-24 NOTE — Progress Notes (Signed)
1 Day Post-Op  Subjective: Complains of soreness but it is much better today. No nausea  Objective: Vital signs in last 24 hours: Temp:  [97.7 F (36.5 C)-99.1 F (37.3 C)] 98 F (36.7 C) (01/17 0601) Pulse Rate:  [80-105] 94 (01/17 0601) Resp:  [15-27] 18 (01/17 0601) BP: (106-141)/(55-89) 133/84 mmHg (01/17 0601) SpO2:  [94 %-100 %] 98 % (01/17 0601) Weight:  [327 lb 2.6 oz (148.4 kg)-331 lb 2.1 oz (150.2 kg)] 331 lb 2.1 oz (150.2 kg) (01/17 0601) Last BM Date: 01/22/13  Intake/Output from previous day: 01/16 0701 - 01/17 0700 In: 2767.5 [I.V.:2767.5] Out: -  Intake/Output this shift:    Resp: clear to auscultation bilaterally Cardio: regular rate and rhythm GI: soft, mild tenderness. incisions ok  Lab Results:  No results found for this basename: WBC, HGB, HCT, PLT,  in the last 72 hours BMET No results found for this basename: NA, K, CL, CO2, GLUCOSE, BUN, CREATININE, CALCIUM,  in the last 72 hours PT/INR No results found for this basename: LABPROT, INR,  in the last 72 hours ABG No results found for this basename: PHART, PCO2, PO2, HCO3,  in the last 72 hours  Studies/Results: No results found.  Anti-infectives: Anti-infectives   Start     Dose/Rate Route Frequency Ordered Stop   01/23/13 0730  cefOXitin (MEFOXIN) 2 g in dextrose 5 % 50 mL IVPB  Status:  Discontinued     2 g 100 mL/hr over 30 Minutes Intravenous  Once 01/23/13 0728 01/23/13 1359      Assessment/Plan: s/p Procedure(s): LAPAROSCOPIC CHOLECYSTECTOMY (N/A) Advance diet Plan for discharge today  LOS: 1 day    TOTH III,PAUL S 01/24/2013

## 2013-01-24 NOTE — Discharge Summary (Signed)
Physician Discharge Summary  Patient ID: Allison [name] MRN: [id number] DOB/AGE: 06/24/1980 33 y.o.  Admit date: 01/23/2013 Discharge date: 01/24/2013  Admission Diagnoses:  Discharge Diagnoses:  Active Problems:   S/P laparoscopic cholecystectomy   Discharged Condition: good  Hospital Course: the pt underwent lap chole. She tolerated the surgery well. On pod 1 she was ready for discharge.  Consults: None  Significant Diagnostic Studies: none  Treatments: surgery: as above  Discharge Exam: Blood pressure 133/84, pulse 94, temperature 98 F (36.7 C), temperature source Oral, resp. rate 18, height 5\' 5"  (1.651 m), weight 331 lb 2.1 oz (150.2 kg), last menstrual period 01/17/2013, SpO2 98.00%. GI: soft, appropriately tender  Disposition: 01-Home or Self Care  Discharge Orders   Future Appointments Provider Department Dept Phone   02/05/2013 9:30 AM Atilano InaEric M Wilson, MD [facility], [place] [phone]   05/25/2013 10:30 AM Brock Badharles A Harper, MD [facility] [phone]   Future Orders Complete By Expires   Call MD for:  difficulty breathing, headache or visual disturbances  As directed    Call MD for:  extreme fatigue  As directed    Call MD for:  hives  As directed    Call MD for:  persistant dizziness or light-headedness  As directed    Call MD for:  persistant nausea and vomiting  As directed    Call MD for:  redness, tenderness, or signs of infection (pain, swelling, redness, odor or green/yellow discharge around incision site)  As directed    Call MD for:  severe uncontrolled pain  As directed    Call MD for:  temperature >100.4  As directed    Diet - low sodium heart healthy  As directed    Discharge instructions  As directed    Comments:     May shower on Sunday. Low fat diet. No heavy lifting   Increase activity slowly  As directed    No wound care  As directed        Medication List         AMLODIPINE BESYLATE PO  Take 5 mg by mouth daily.      carvedilol 12.5 MG tablet  Commonly known as:  COREG  Take 12.5 mg by mouth 2 (two) times daily with a meal.     cholecalciferol 1000 UNITS tablet  Commonly known as:  VITAMIN D  Take 1,000 Units by mouth daily.     enalapril 10 MG tablet  Commonly known as:  VASOTEC  Take 10 mg by mouth daily.     furosemide 40 MG tablet  Commonly known as:  LASIX  Take 40 mg by mouth daily.     gabapentin 300 MG capsule  Commonly known as:  NEURONTIN  Take 300 mg by mouth 2 (two) times daily.     ibuprofen 200 MG tablet  Commonly known as:  ADVIL,MOTRIN  Take 400 mg by mouth every 4 (four) hours as needed. For pain     nabumetone 500 MG tablet  Commonly known as:  RELAFEN  Take 500 mg by mouth 2 (two) times daily.     omeprazole 20 MG capsule  Commonly known as:  PRILOSEC  Take 20 mg by mouth 2 (two) times daily.     orphenadrine 100 MG tablet  Commonly known as:  NORFLEX  Take 100 mg by mouth 2 (two) times daily.     oxyCODONE-acetaminophen 5-325 MG per tablet  Commonly known as:  ROXICET  Take 1-2  tablets by mouth every 4 (four) hours as needed for severe pain.     oxyCODONE-acetaminophen 5-325 MG per tablet  Commonly known as:  ROXICET  Take 1-2 tablets by mouth every 4 (four) hours as needed for severe pain.     promethazine 25 MG tablet  Commonly known as:  PHENERGAN  Take 25 mg by mouth every 6 (six) hours as needed for nausea.           Follow-up Information   Follow up with Atilano Ina, MD. (9:30 AM)    Specialty:  General Surgery   Contact information:   [address] [place] [address] [phone]       Signed: Robyne Askew 01/24/2013, 9:12 AM

## 2013-01-27 ENCOUNTER — Encounter (HOSPITAL_COMMUNITY): Payer: Self-pay | Admitting: General Surgery

## 2013-02-05 ENCOUNTER — Encounter (INDEPENDENT_AMBULATORY_CARE_PROVIDER_SITE_OTHER): Payer: Medicaid Other | Admitting: General Surgery

## 2013-02-11 ENCOUNTER — Encounter (INDEPENDENT_AMBULATORY_CARE_PROVIDER_SITE_OTHER): Payer: Self-pay | Admitting: General Surgery

## 2013-02-11 ENCOUNTER — Ambulatory Visit (INDEPENDENT_AMBULATORY_CARE_PROVIDER_SITE_OTHER): Payer: Medicaid Other | Admitting: General Surgery

## 2013-02-11 VITALS — BP 128/82 | HR 84 | Resp 16 | Ht 65.0 in | Wt 323.0 lb

## 2013-02-11 DIAGNOSIS — Z09 Encounter for follow-up examination after completed treatment for conditions other than malignant neoplasm: Secondary | ICD-10-CM

## 2013-02-11 NOTE — Progress Notes (Signed)
Subjective:     Patient ID: Tomma RakersSeamon M Tiedeman, female   DOB: 02-27-1980, 33 y.o.   MRN: [id number]  HPI 33 year old morbidly obese African American female comes in today for followup after undergoing laparoscopic cholecystectomy on January 16. She states that she did well after surgery. She denies any fevers, chills, nausea or vomiting. She still has some mild soreness at her left upper quadrant trocar site. She reports normal bowel movements. The discomfort and pain she was having preoperatively has resolved.  Review of Systems     Objective:   Physical Exam BP 128/82  Pulse 84  Resp 16  Ht 5\' 5"  (1.651 m)  Wt 323 lb (146.512 kg)  BMI 53.75 kg/m2  LMP 12/23/2012  Gen: alert, NAD, non-toxic appearing Pupils: equal, no scleral icterus Pulm: Lungs clear to auscultation, symmetric chest rise CV: regular rate and rhythm Abd: soft, nontender, obese, nondistended. Well-healed trocar sites. No cellulitis. No incisional hernia Skin: no rash, no jaundice     Assessment:     Status post laparoscopic cholecystectomy for chronic cholecystitis and cholelithiasis     Plan:     Overall she is doing well. We reviewed her pathology report which demonstrated chronic cholecystitis and cholelithiasis. I explained that the left upper trocar site is where we extracted the gallbladder and that the soreness should continue to improve. I explained that she could take Motrin as needed for the discomfort. Followup as needed  Mary Sellaric M. Andrey CampanileWilson, MD, FACS General, Bariatric, & Minimally Invasive Surgery [facility] [place]

## 2013-02-11 NOTE — Patient Instructions (Signed)
Can take motrin as needed for discomfort

## 2013-05-25 ENCOUNTER — Ambulatory Visit (INDEPENDENT_AMBULATORY_CARE_PROVIDER_SITE_OTHER): Payer: Medicaid Other | Admitting: Obstetrics

## 2013-05-25 ENCOUNTER — Encounter: Payer: Self-pay | Admitting: Obstetrics

## 2013-05-25 VITALS — BP 128/83 | HR 88 | Temp 98.2°F | Ht 65.0 in | Wt 311.0 lb

## 2013-05-25 DIAGNOSIS — N870 Mild cervical dysplasia: Secondary | ICD-10-CM

## 2013-05-25 NOTE — Progress Notes (Addendum)
Patient ID: Allison [name], female   DOB: 29-May-1980, 33 y.o.   MRN: [id number]  Chief Complaint  Patient presents with  . Follow-up    Repeat Pap    HPI Allison RakersSeamon M Pitner is a 33 y.o. female.  H/O LGSIL pap.  HPI  Past Medical History  Diagnosis Date  . Migraines   . Gallstones   . 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 Morbid obesity   . Arthritis   . GERD (gastroesophageal reflux disease)     takes Omeprazole daily  . Nausea     takes Phenergan as needed  . CHF (congestive heart failure)     takes Furosemide daily  . PIH (pregnancy induced hypertension)     takes Coreg,Enalapril,and Amlodipine daily  . Shortness of breath     with exertion  . History of bronchitis     2188yrs ago  . History of migraine     last one 2 days ago  . Carpal tunnel syndrome of right wrist   . Osteoarthritis     takes Relafen daily  . Chronic back pain     Past Surgical History  Procedure Laterality Date  . Cesarean section  2011  . Tubal ligation  2011  . Cholecystectomy N/A 01/23/2013    Procedure: LAPAROSCOPIC CHOLECYSTECTOMY;  Surgeon: Atilano InaEric M Wilson, MD;  [facility];  Service: General;  Laterality: N/A;    Family History  Problem Relation Age of Onset  . Hypertension Mother   . Diabetes Mother   . Heart disease Mother   . Miscarriages / [place] Mother   . COPD Mother   . Arthritis Mother   . Alcohol abuse Father   . Asthma Father   . Diabetes Maternal Grandmother   . Heart disease Maternal Grandmother   . Cancer Maternal Grandfather     leukemia  . Arthritis Maternal Grandfather   . Cancer Paternal Grandmother     breast  . Cancer Paternal Grandfather     leukemia    Social History History  Substance Use Topics  . Smoking status: Never Smoker   . Smokeless tobacco: Never Used  . Alcohol Use: No    No Known Allergies  Current Outpatient Prescriptions  Medication Sig Dispense Refill  . AMLODIPINE BESYLATE PO Take 5 mg by mouth daily.      .  carvedilol (COREG) 12.5 MG tablet Take 12.5 mg by mouth 2 (two) times daily with a meal.        . cholecalciferol (VITAMIN D) 1000 UNITS tablet Take 1,000 Units by mouth daily.      . enalapril (VASOTEC) 10 MG tablet Take 10 mg by mouth daily.        Marland Kitchen. gabapentin (NEURONTIN) 300 MG capsule Take 300 mg by mouth 2 (two) times daily.      Marland Kitchen. ibuprofen (ADVIL,MOTRIN) 200 MG tablet Take 400 mg by mouth every 4 (four) hours as needed. For pain       . nabumetone (RELAFEN) 500 MG tablet Take 500 mg by mouth 2 (two) times daily.      Marland Kitchen. omeprazole (PRILOSEC) 20 MG capsule Take 20 mg by mouth 2 (two) times daily.      . orphenadrine (NORFLEX) 100 MG tablet Take 100 mg by mouth 2 (two) times daily.      Marland Kitchen. oxyCODONE-acetaminophen (PERCOCET/ROXICET) 5-325 MG per tablet Take 1-2 tablets by mouth every 4 (four) hours as needed for moderate pain.  30 tablet  0  . promethazine (  PHENERGAN) 25 MG tablet Take 25 mg by mouth every 6 (six) hours as needed for nausea.       No current facility-administered medications for this visit.    Review of Systems Review of Systems Constitutional: negative for fatigue and weight loss Respiratory: negative for cough and wheezing Cardiovascular: negative for chest pain, fatigue and palpitations Gastrointestinal: negative for abdominal pain and change in bowel habits Genitourinary:negative Integument/breast: negative for nipple discharge Musculoskeletal:negative for myalgias Neurological: negative for gait problems and tremors Behavioral/Psych: negative for abusive relationship, depression Endocrine: negative for temperature intolerance     Blood pressure 128/83, pulse 88, temperature 98.2 F (36.8 C), height 5\' 5"  (1.651 m), weight 311 lb (141.069 kg), last menstrual period 04/23/2013.  Physical Exam Physical Exam General:   alert  Skin:   no rash or abnormalities  Lungs:   clear to auscultation bilaterally  Heart:   regular rate and rhythm, S1, S2 normal, no murmur,  click, rub or gallop  Breasts:   normal without suspicious masses, skin or nipple changes or axillary nodes  Abdomen:  normal findings: no organomegaly, soft, non-tender and no hernia  Pelvis:  External genitalia: normal general appearance Urinary system: urethral meatus normal and bladder without fullness, nontender Vaginal: normal without tenderness, induration or masses Cervix: normal appearance.  Pap done. Adnexa: normal bimanual exam Uterus: anteverted and non-tender, normal size      Data Reviewed Previous pap smears.  Assessment    LGSIL     Plan    Pap smear done. Repeat pap q 6 months.  Orders Placed This Encounter  Procedures  . WET PREP BY MOLECULAR PROBE   No orders of the defined types were placed in this encounter.       Brock Badharles A Harper 05/25/2013, 4:44 PM

## 2013-05-26 LAB — PAP IG W/ RFLX HPV ASCU

## 2013-05-26 LAB — WET PREP BY MOLECULAR PROBE
CANDIDA SPECIES: NEGATIVE
Gardnerella vaginalis: POSITIVE — AB
TRICHOMONAS VAG: NEGATIVE

## 2013-06-02 ENCOUNTER — Other Ambulatory Visit: Payer: Self-pay | Admitting: *Deleted

## 2013-06-02 DIAGNOSIS — B9689 Other specified bacterial agents as the cause of diseases classified elsewhere: Secondary | ICD-10-CM

## 2013-06-02 DIAGNOSIS — N760 Acute vaginitis: Principal | ICD-10-CM

## 2013-06-02 MED ORDER — METRONIDAZOLE 500 MG PO TABS: 500.0000 mg | ORAL_TABLET | Freq: Two times a day (BID) | ORAL | Status: DC

## 2013-07-02 ENCOUNTER — Ambulatory Visit (INDEPENDENT_AMBULATORY_CARE_PROVIDER_SITE_OTHER): Payer: Medicaid Other | Admitting: Cardiology

## 2013-07-02 ENCOUNTER — Encounter: Payer: Self-pay | Admitting: Cardiology

## 2013-07-02 VITALS — BP 120/90 | HR 84 | Ht 65.0 in | Wt 309.0 lb

## 2013-07-02 DIAGNOSIS — I429 Cardiomyopathy, unspecified: Secondary | ICD-10-CM

## 2013-07-02 NOTE — Patient Instructions (Signed)
Your physician wants you to follow-up in: ONE YEAR WITH DR HOCHREIN You will receive a reminder letter in the mail two months in advance. If you don't receive a letter, please call our office to schedule the follow-up appointment.  

## 2013-07-02 NOTE — Progress Notes (Signed)
HPI The patient presents for followup of nonischemic cardiomyopathy with an ejection fraction as low as 25% followup echo in 2012 and again in Dec 2014 demonstrated it was up to 60%.  She had this evaluated prior to cholecystectomy.  She has had back problems but still walks.  The patient denies any new symptoms such as chest discomfort, neck or arm discomfort. There has been no new shortness of breath, PND or orthopnea. There have been no reported palpitations, presyncope or syncope.  OSEI-BONSU,GEORGE, MD did stop her Lasix recently.  She watches her salt.   No Known Allergies  Current Outpatient Prescriptions  Medication Sig Dispense Refill  . AMLODIPINE BESYLATE PO Take 5 mg by mouth daily.      . carvedilol (COREG) 12.5 MG tablet Take 12.5 mg by mouth 2 (two) times daily with a meal.        . cholecalciferol (VITAMIN D) 1000 UNITS tablet Take 1,000 Units by mouth daily.      . enalapril (VASOTEC) 10 MG tablet Take 10 mg by mouth daily.        Marland Kitchen. gabapentin (NEURONTIN) 300 MG capsule Take 300 mg by mouth 2 (two) times daily.      Marland Kitchen. ibuprofen (ADVIL,MOTRIN) 200 MG tablet Take 400 mg by mouth every 4 (four) hours as needed. For pain       . metroNIDAZOLE (FLAGYL) 500 MG tablet Take 1 tablet (500 mg total) by mouth 2 (two) times daily.  14 tablet  0  . nabumetone (RELAFEN) 500 MG tablet Take 500 mg by mouth 2 (two) times daily.      Marland Kitchen. omeprazole (PRILOSEC) 20 MG capsule Take 20 mg by mouth 2 (two) times daily.      . orphenadrine (NORFLEX) 100 MG tablet Take 100 mg by mouth 2 (two) times daily.      Marland Kitchen. oxyCODONE-acetaminophen (PERCOCET/ROXICET) 5-325 MG per tablet Take 1-2 tablets by mouth every 4 (four) hours as needed for moderate pain.  30 tablet  0  . promethazine (PHENERGAN) 25 MG tablet Take 25 mg by mouth every 6 (six) hours as needed for nausea.      Marland Kitchen. topiramate (TOPAMAX) 100 MG tablet Take 100 mg by mouth 2 (two) times daily.       No current facility-administered medications for  this visit.    Past Medical History  Diagnosis Date  . Migraines   . Gallstones   . 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 Morbid obesity   . Arthritis   . GERD (gastroesophageal reflux disease)     takes Omeprazole daily  . Nausea     takes Phenergan as needed  . CHF (congestive heart failure)     takes Furosemide daily  . PIH (pregnancy induced hypertension)     takes Coreg,Enalapril,and Amlodipine daily  . Shortness of breath     with exertion  . History of bronchitis     6723yrs ago  . History of migraine     last one 2 days ago  . Carpal tunnel syndrome of right wrist   . Osteoarthritis     takes Relafen daily  . Chronic back pain     Past Surgical History  Procedure Laterality Date  . Cesarean section  2011  . Tubal ligation  2011  . Cholecystectomy N/A 01/23/2013    Procedure: LAPAROSCOPIC CHOLECYSTECTOMY;  Surgeon: Atilano InaEric M Wilson, MD;  [facility];  Service: General;  Laterality: N/A;    ROS:  As stated in  the HPI and negative for all other systems.  PHYSICAL EXAM  BP 120/90  Pulse 84  Ht 5\' 5"  (1.651 m)  Wt 309 lb (140.161 kg)  BMI 51.42 kg/m2 GENERAL:  Well appearing NECK:  No jugular venous distention, waveform within normal limits, carotid upstroke brisk and symmetric, no bruits, no thyromegaly LUNGS:  Clear to auscultation bilaterally BACK:  No CVA tenderness CHEST:  Unremarkable HEART:  PMI not displaced or sustained,S1 and S2 within normal limits, no S3, no S4, no clicks, no rubs, no murmurs ABD:  Flat, positive bowel sounds normal in frequency in pitch, no bruits, no rebound, no guarding, no midline pulsatile mass, no hepatomegaly, no splenomegaly, morbid obesity EXT:  2 plus pulses throughout, no edema, no cyanosis no clubbing   EKG:  Sinus rhythm, rate 84, axis within normal limits, intervals within normal limits, no acute ST-T wave changes.  07/02/2013  ASSESSMENT AND PLAN  CARDIOMYOPATHY Her EF was 60% in Dec.  No change in  therapy is indicated.   ESSENTIAL HYPERTENSION Her blood pressure is controlled with current meds. This is a reasonable regimen and she will continue on this.  OBESITY, MORBID  She has lost some weight and I agree with slow and gradual weight loss as suggested by Jackie PlumSEI-BONSU,GEORGE, MD

## 2013-11-09 ENCOUNTER — Encounter: Payer: Self-pay | Admitting: Cardiology

## 2013-11-25 ENCOUNTER — Encounter: Payer: Self-pay | Admitting: Obstetrics

## 2013-11-25 ENCOUNTER — Ambulatory Visit (INDEPENDENT_AMBULATORY_CARE_PROVIDER_SITE_OTHER): Payer: Medicaid Other | Admitting: Obstetrics

## 2013-11-25 VITALS — BP 118/80 | HR 81 | Temp 97.0°F | Ht 65.0 in | Wt 299.0 lb

## 2013-11-25 DIAGNOSIS — N760 Acute vaginitis: Secondary | ICD-10-CM

## 2013-11-25 DIAGNOSIS — Z0000 Encounter for general adult medical examination without abnormal findings: Secondary | ICD-10-CM

## 2013-11-25 NOTE — Progress Notes (Signed)
Subjective:     Allison [name] is a 33 y.o. female here for a routine exam.  Current complaints: none.    Personal health questionnaire:  Is patient [name], have a family history of breast and/or ovarian cancer: no Is there a family history of uterine cancer diagnosed at age < 950, gastrointestinal cancer, urinary tract cancer, family member who is a Personnel officerLynch syndrome-associated carrier: no Is the patient overweight and hypertensive, family history of diabetes, personal history of gestational diabetes or PCOS: no Is patient over 6155, have PCOS,  family history of premature CHD under age 32, diabetes, smoke, have hypertension or peripheral artery disease:  no At any time, has a partner hit, kicked or otherwise hurt or frightened you?: no Over the past 2 weeks, have you felt down, depressed or hopeless?: no Over the past 2 weeks, have you felt little interest or pleasure in doing things?:no   Gynecologic History Patient's last menstrual period was 11/14/2013 (exact date). Contraception: tubal ligation Last Pap: 2014. Results were: normal Last mammogram: n/a. Results were: n/a  Obstetric History OB History  Gravida Para Term Preterm AB SAB TAB Ectopic Multiple Living  3 3 3       3     # Outcome Date GA Lbr Len/2nd Weight Sex Delivery Anes PTL Lv  3 Term 06/28/09 5370w0d  5 lb (2.268 kg) M CS-LTranv EPI N Y     Comments: PIH, Congestive Heart Failure  2 Term 04/16/05 2883w0d  7 lb 8 oz (3.402 kg) F Vag-Spont EPI N Y  1 Term 02/06/01 3270w0d  5 lb 7 oz (2.466 kg) F Vag-Spont EPI N Y      Past Medical History  Diagnosis Date  . Migraines   . Gallstones   . 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 Morbid obesity   . Arthritis   . GERD (gastroesophageal reflux disease)     takes Omeprazole daily  . Nausea     takes Phenergan as needed  . PIH (pregnancy induced hypertension)     takes Coreg,Enalapril,and Amlodipine daily  . History of migraine   . Carpal tunnel syndrome  of right wrist   . Osteoarthritis     takes Relafen daily  . Chronic back pain     Past Surgical History  Procedure Laterality Date  . Cesarean section  2011  . Tubal ligation  2011  . Cholecystectomy N/A 01/23/2013    Procedure: LAPAROSCOPIC CHOLECYSTECTOMY;  Surgeon: Atilano InaEric M Wilson, MD;  [facility];  Service: General;  Laterality: N/A;    Current outpatient prescriptions: AMLODIPINE BESYLATE PO, Take 5 mg by mouth daily., Disp: , Rfl: ;  carvedilol (COREG) 12.5 MG tablet, Take 12.5 mg by mouth 2 (two) times daily with a meal.  , Disp: , Rfl: ;  cholecalciferol (VITAMIN D) 1000 UNITS tablet, Take 1,000 Units by mouth daily., Disp: , Rfl: ;  enalapril (VASOTEC) 10 MG tablet, Take 10 mg by mouth daily.  , Disp: , Rfl:  gabapentin (NEURONTIN) 300 MG capsule, Take 300 mg by mouth 2 (two) times daily., Disp: , Rfl: ;  ibuprofen (ADVIL,MOTRIN) 200 MG tablet, Take 400 mg by mouth every 4 (four) hours as needed. For pain , Disp: , Rfl: ;  nabumetone (RELAFEN) 500 MG tablet, Take 500 mg by mouth 2 (two) times daily., Disp: , Rfl: ;  omeprazole (PRILOSEC) 20 MG capsule, Take 20 mg by mouth 2 (two) times daily., Disp: , Rfl:  topiramate (TOPAMAX) 100 MG tablet, Take  100 mg by mouth 2 (two) times daily., Disp: , Rfl:  Allergies  Allergen Reactions  . Reglan [Metoclopramide] Anxiety    History  Substance Use Topics  . Smoking status: Never Smoker   . Smokeless tobacco: Never Used  . Alcohol Use: No    Family History  Problem Relation Age of Onset  . Hypertension Mother   . Diabetes Mother   . Heart disease Mother   . Miscarriages / [place] Mother   . COPD Mother   . Arthritis Mother   . Alcohol abuse Father   . Asthma Father   . Diabetes Maternal Grandmother   . Heart disease Maternal Grandmother   . Cancer Maternal Grandfather     leukemia  . Arthritis Maternal Grandfather   . Cancer Paternal Grandmother     breast  . Cancer Paternal Grandfather     leukemia      Review of  Systems  Constitutional: negative for fatigue and weight loss Respiratory: negative for cough and wheezing Cardiovascular: negative for chest pain, fatigue and palpitations Gastrointestinal: negative for abdominal pain and change in bowel habits Musculoskeletal:negative for myalgias Neurological: negative for gait problems and tremors Behavioral/Psych: negative for abusive relationship, depression Endocrine: negative for temperature intolerance   Genitourinary:negative for abnormal menstrual periods, genital lesions, hot flashes, sexual problems and vaginal discharge Integument/breast: negative for breast lump, breast tenderness, nipple discharge and skin lesion(s)    Objective:       BP 118/80 mmHg  Pulse 81  Temp(Src) 97 F (36.1 C)  Ht 5\' 5"  (1.651 m)  Wt 299 lb (135.626 kg)  BMI 49.76 kg/m2  LMP 11/14/2013 (Exact Date) General:   alert  Skin:   no rash or abnormalities  Lungs:   clear to auscultation bilaterally  Heart:   regular rate and rhythm, S1, S2 normal, no murmur, click, rub or gallop  Breasts:   normal without suspicious masses, skin or nipple changes or axillary nodes  Abdomen:  normal findings: no organomegaly, soft, non-tender and no hernia  Pelvis:  External genitalia: normal general appearance Urinary system: urethral meatus normal and bladder without fullness, nontender Vaginal: normal without tenderness, induration or masses Cervix: normal appearance Adnexa: normal bimanual exam Uterus: anteverted and non-tender, normal size   Lab Review Urine pregnancy test Labs reviewed yes Radiologic studies reviewed no    Assessment:    Healthy female exam.    Plan:    Education reviewed: calcium supplements, low fat, low cholesterol diet, safe sex/STD prevention, self breast exams and weight bearing exercise. Follow up in: 1 year.   No orders of the defined types were placed in this encounter.   Orders Placed This Encounter  Procedures  . WET PREP BY  MOLECULAR PROBE

## 2013-11-26 ENCOUNTER — Other Ambulatory Visit: Payer: Self-pay | Admitting: Obstetrics

## 2013-11-26 DIAGNOSIS — N760 Acute vaginitis: Secondary | ICD-10-CM

## 2013-11-26 DIAGNOSIS — B9689 Other specified bacterial agents as the cause of diseases classified elsewhere: Secondary | ICD-10-CM

## 2013-11-26 DIAGNOSIS — A5901 Trichomonal vulvovaginitis: Secondary | ICD-10-CM

## 2013-11-26 LAB — WET PREP BY MOLECULAR PROBE
Candida species: NEGATIVE
Gardnerella vaginalis: POSITIVE — AB
Trichomonas vaginosis: POSITIVE — AB

## 2013-11-26 MED ORDER — TINIDAZOLE 500 MG PO TABS: 2.0000 g | ORAL_TABLET | Freq: Every day | ORAL | Status: DC

## 2013-11-27 LAB — PAP IG AND HPV HIGH-RISK: HPV DNA HIGH RISK: NOT DETECTED

## 2014-01-18 ENCOUNTER — Ambulatory Visit: Payer: Medicaid Other | Admitting: Physical Therapy

## 2014-01-19 ENCOUNTER — Telehealth: Payer: Self-pay | Admitting: *Deleted

## 2014-01-19 NOTE — Telephone Encounter (Signed)
pt called stating that she already have an appt for this day, and requested to r/s. Give pt appt for 01/27/14 @ 8:45am....td

## 2014-01-26 ENCOUNTER — Ambulatory Visit: Payer: Medicaid Other | Admitting: Physical Therapy

## 2014-01-27 ENCOUNTER — Ambulatory Visit: Payer: Medicaid Other | Attending: Anesthesiology | Admitting: Physical Therapy

## 2014-01-27 DIAGNOSIS — M545 Low back pain, unspecified: Secondary | ICD-10-CM

## 2014-01-27 DIAGNOSIS — M79604 Pain in right leg: Secondary | ICD-10-CM | POA: Diagnosis not present

## 2014-01-27 NOTE — Patient Instructions (Signed)
Shoulder Push-Up (Prone on Elbows)   With elbows placed under shoulders, rise up on elbows as high as possible. Keep hips on surface and back arched. Hold ___10-30_ seconds. Repeat ___2-3_ times. Do ____2-3 sessions per day. STOP IF LEG PAIN OR NUMBNESS INCREASES  Copyright  VHI. All rights reserved.  Lower Trunk Rotation Stretch   Keeping back flat and feet together, rotate knees to left side. Hold ___10_ seconds. Repeat __10__ times per set. Do ___1_ sets per session. Do __2-3__ sessions per day.  http://orth.exer.us/123   Copyright  VHI. All rights reserved.  Abduction: Clam (Eccentric) - Side-Lying   Lie on side with knees bent. Lift top knee, keeping feet together. Keep trunk steady. Slowly lower for 3-5 seconds. _10__ reps per set, _2__ sets per day, _5__ days per week.  Sleeping on Back  Place pillow under knees. A pillow with cervical support and a roll around waist are also helpful. Copyright  VHI. All rights reserved.  Sleeping on Side Place pillow between knees. Use cervical support under neck and a roll around waist as needed. Copyright  VHI. All rights reserved.   Sleeping on Stomach   If this is the only desirable sleeping position, place pillow under lower legs, and under stomach or chest as needed.  Posture - Sitting   Sit upright, head facing forward. Try using a roll to support lower back. Keep shoulders relaxed, and avoid rounded back. Keep hips level with knees. Avoid crossing legs for long periods. Stand to Sit / Sit to Stand   To sit: Bend knees to lower self onto front edge of chair, then scoot back on seat. To stand: Reverse sequence by placing one foot forward, and scoot to front of seat. Use rocking motion to stand up.   Work Height and Reach  Ideal work height is no more than 2 to 4 inches below elbow level when standing, and at elbow level when sitting. Reaching should be limited to arm's length, with elbows slightly  bent.  Bending  Bend at hips and knees, not back. Keep feet shoulder-width apart.    Posture - Standing   Good posture is important. Avoid slouching and forward head thrust. Maintain curve in low back and align ears over shoul- ders, hips over ankles.  Alternating Positions   Alternate tasks and change positions frequently to reduce fatigue and muscle tension. Take rest breaks. Computer Work   Position work to Art gallery managerface forward. Use proper work and seat height. Keep shoulders back and down, wrists straight, and elbows at right angles. Use chair that provides full back support. Add footrest and lumbar roll as needed.  Getting Into / Out of Car  Lower self onto seat, scoot back, then bring in one leg at a time. Reverse sequence to get out.  Dressing  Lie on back to pull socks or slacks over feet, or sit and bend leg while keeping back straight.    Housework - Sink  Place one foot on ledge of cabinet under sink when standing at sink for prolonged periods.   Pushing / Pulling  Pushing is preferable to pulling. Keep back in proper alignment, and use leg muscles to do the work.  Deep Squat   Squat and lift with both arms held against upper trunk. Tighten stomach muscles without holding breath. Use smooth movements to avoid jerking.  Avoid Twisting   Avoid twisting or bending back. Pivot around using foot movements, and bend at knees if needed when reaching for articles.  Carrying Luggage   Distribute weight evenly on both sides. Use a cart whenever possible. Do not twist trunk. Move body as a unit.   Lifting Principles .Maintain proper posture and head alignment. .Slide object as close as possible before lifting. .Move obstacles out of the way. .Test before lifting; ask for help if too heavy. .Tighten stomach muscles without holding breath. .Use smooth movements; do not jerk. .Use legs to do the work, and pivot with feet. .Distribute the work load symmetrically and  close to the center of trunk. .Push instead of pull whenever possible.   Ask For Help   Ask for help and delegate to others when possible. Coordinate your movements when lifting together, and maintain the low back curve.  Log Roll   Lying on back, bend left knee and place left arm across chest. Roll all in one movement to the right. Reverse to roll to the left. Always move as one unit. Housework - Sweeping  Use long-handled equipment to avoid stooping.   Housework - Wiping  Position yourself as close as possible to reach work surface. Avoid straining your back.  Laundry - Unloading Wash   To unload small items at bottom of washer, lift leg opposite to arm being used to reach.  Gardening - Raking  Move close to area to be raked. Use arm movements to do the work. Keep back straight and avoid twisting.     Cart  When reaching into cart with one arm, lift opposite leg to keep back straight.   Getting Into / Out of Bed  Lower self to lie down on one side by raising legs and lowering head at the same time. Use arms to assist moving without twisting. Bend both knees to roll onto back if desired. To sit up, start from lying on side, and use same move-ments in reverse. Housework - Vacuuming  Hold the vacuum with arm held at side. Step back and forth to move it, keeping head up. Avoid twisting.   Laundry - Armed forces training and education officer so that bending and twisting can be avoided.   Laundry - Unloading Dryer  Squat down to reach into clothes dryer or use a reacher.  Gardening - Weeding / Psychiatric nurse or Kneel. Knee pads may be helpful.                    Copyright  VHI. All rights reserved.

## 2014-01-27 NOTE — Therapy (Signed)
[facility] [address] [place], [place], [id number] Phone: [phone]   Fax:  [phone]  Physical Therapy Evaluation/Discharge  Patient Details  Name: Allison [name] MRN: [id number] Date of Birth: 10-27-1980 Referring Provider:  Tonye RoyaltyGyarteng-Dakwa, Kwadwo,*  Encounter Date: 01/27/2014      PT End of Session - 01/27/14 0945    Visit Number 1   Number of Visits 1   PT Start Time 0848   PT Stop Time 0941   PT Time Calculation (min) 53 min   Activity Tolerance Patient tolerated treatment well      Past Medical History  Diagnosis Date  . Migraines   . Gallstones   . 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 Morbid obesity   . Arthritis   . GERD (gastroesophageal reflux disease)     takes Omeprazole daily  . Nausea     takes Phenergan as needed  . PIH (pregnancy induced hypertension)     takes Coreg,Enalapril,and Amlodipine daily  . History of migraine   . Carpal tunnel syndrome of right wrist   . Osteoarthritis     takes Relafen daily  . Chronic back pain     Past Surgical History  Procedure Laterality Date  . Cesarean section  2011  . Tubal ligation  2011  . Cholecystectomy N/A 01/23/2013    Procedure: LAPAROSCOPIC CHOLECYSTECTOMY;  Surgeon: Atilano InaEric M Wilson, MD;  [facility];  Service: General;  Laterality: N/A;    There were no vitals taken for this visit.  Visit Diagnosis:  Low back pain radiating to right leg - Plan: PT plan of care cert/re-cert      Subjective Assessment - 01/27/14 0858    Symptoms Patient has chronic LBP and was refered from pain clinnc. Rt. LE gives out and has pain in R>L LE.  Hands and R feet numb.    Pertinent History has tried orthopedic, chiro and PT previous.  Has diabetes, CHF 2011.    Limitations Standing;Walking;Sitting;Lifting;House hold activities   How long can you sit comfortably? 20-25 min   How long can you stand comfortably? 20-25 min    Diagnostic tests  MRI, XR   Patient Stated Goals to be able to do basic activities without pain   Currently in Pain? Yes   Pain Score 8    Pain Location Back   Pain Orientation Right   Pain Descriptors / Indicators Numbness;Sharp   Pain Type Chronic pain   Pain Radiating Towards Rt. LE   Pain Onset More than a month ago   Pain Frequency Intermittent   Aggravating Factors  prolonged positions, cold, bending FW   Pain Relieving Factors heat   Effect of Pain on Daily Activities cannot do things with kids that she want, pain    Multiple Pain Sites No          [facility] PT Assessment - 01/27/14 0904    Assessment   Medical Diagnosis lumbago   Onset Date --  chronic 4239yrs   Next MD Visit 02/16/14   Prior Therapy Yes   Precautions   Precautions None   Restrictions   Weight Bearing Restrictions No   Balance Screen   Has the patient fallen in the past 6 months No   Home Environment   Living Enviornment Private residence   Living Arrangements Children   Type of Home House   Home Access Stairs to enter   Entrance Stairs-Number of Steps 3   Observation/Other Assessments   Focus on  Therapeutic Outcomes (FOTO)  NT   AROM   Lumbar Flexion 50%   Lumbar Extension 50%   Lumbar - Right Side Bend WNL pain   Lumbar - Left Side Bend WNL   Lumbar - Right Rotation WNL    Lumbar - Left Rotation WNL   Strength   Right Hip Flexion 3/5   Left Hip Flexion 3+/5   Right Knee Flexion 4+/5   Right Knee Extension 4+/5   Left Knee Flexion 4-/5   Left Knee Extension 4-/5     Self care: HEP: prone and reduction of LEG symptoms, posture and positioning, lower trunk rotation and cla. Pt. Performed exercises in clinic with min cueing.       PT Education - 01/27/14 0945    Education provided Yes   Education Details PT, HEP, posture and body mechanics, heat   Person(s) Educated Patient   Methods Explanation   Comprehension Verbalized understanding             PT Long Term Goals - 01/27/14 1319    PT LONG  TERM GOAL #1   Title Pt will be I with basic HEP   Time 1   Period Days   Status Achieved   PT LONG TERM GOAL #2   Title Pt will be given info on posture and body mech   Time 1   Period Days   Status Achieved             Plan - 01/27/14 0947    Clinical Impression Statement Patient presents with low back and LE pain which is been chronic and worsening over the past year.  Medicaid coverage will not include PT, but she was given info on [facility] clinic, Fin Asst and HEP for LBP>    Pt will benefit from skilled therapeutic intervention in order to improve on the following deficits Cardiopulmonary status limiting activity;Decreased endurance;Impaired flexibility;Improper body mechanics;Decreased range of motion;Decreased activity tolerance;Increased fascial restricitons;Obesity;Pain;Decreased mobility;Decreased strength   Rehab Potential Fair   PT Treatment/Interventions Other (comment)  No PT due to insurance   PT Next Visit Plan NA   PT Home Exercise Plan given Prone on elbows, trunk stretching and clam          Problem List Patient Active Problem List   Diagnosis Date Noted  . Vaginitis 11/25/2013  . Mild dysplasia of cervix 11/24/2012  . Vaginitis and vulvovaginitis, unspecified 11/10/2012  . Papanicolaou smear of cervix with low grade squamous intraepithelial lesion (LGSIL) 10/21/2012  . HTN (hypertension) 12/10/2011  . Fatigue 03/04/2010  . OBESITY, MORBID 02/10/2010  . ESSENTIAL HYPERTENSION, BENIGN 07/20/2009  . Unspecified secondary cardiomyopathy 07/20/2009  . EDEMA 07/20/2009    Refael Fulop 01/27/2014, 1:22 PM  [facility] [address] [place], [place], [address] Phone: [phone]   Fax:  [phone]

## 2014-07-01 ENCOUNTER — Encounter: Payer: Self-pay | Admitting: Cardiology

## 2014-07-01 ENCOUNTER — Ambulatory Visit (INDEPENDENT_AMBULATORY_CARE_PROVIDER_SITE_OTHER): Payer: Medicaid Other | Admitting: Cardiology

## 2014-07-01 VITALS — BP 116/78 | HR 84 | Ht 65.0 in | Wt 282.3 lb

## 2014-07-01 DIAGNOSIS — I10 Essential (primary) hypertension: Secondary | ICD-10-CM

## 2014-07-01 NOTE — Patient Instructions (Signed)
Your physician recommends that you schedule a follow-up appointment As Needed  

## 2014-07-01 NOTE — Progress Notes (Signed)
HPI The patient presents for followup of nonischemic cardiomyopathy with an ejection fraction as low as 25% followup echo in 2012 and echoin Dec 2014 demonstrated it was up to 60%.  She has been doing well.  She denies any SOB.  She has fleeting shooting chest pains at times.   She has had back problems but still walks. There has been no new shortness of breath, PND or orthopnea. There have been no reported palpitations, presyncope or syncope.  She has lost 48 lbs from her peak weight!  Allergies  Allergen Reactions  . Reglan [Metoclopramide] Anxiety    Current Outpatient Prescriptions  Medication Sig Dispense Refill  . amLODipine (NORVASC) 5 MG tablet Take 5 mg by mouth daily.  3  . carvedilol (COREG) 12.5 MG tablet Take 12.5 mg by mouth 2 (two) times daily with a meal.      . cholecalciferol (VITAMIN D) 1000 UNITS tablet Take 1,000 Units by mouth daily.    . cyclobenzaprine (FLEXERIL) 5 MG tablet Take 5 mg by mouth 2 (two) times daily.  2  . DENTA 5000 PLUS 1.1 % CREA dental cream Take 1 application by mouth daily.  6  . diclofenac (VOLTAREN) 75 MG EC tablet Take 75 mg by mouth 2 (two) times daily.  2  . enalapril (VASOTEC) 10 MG tablet Take 10 mg by mouth daily.      Marland Kitchen gabapentin (NEURONTIN) 300 MG capsule Take 300 mg by mouth 2 (two) times daily.    Marland Kitchen omeprazole (PRILOSEC) 20 MG capsule Take 20 mg by mouth 2 (two) times daily.    Marland Kitchen oxyCODONE-acetaminophen (PERCOCET) 10-325 MG per tablet Take 1 tablet by mouth every 8 (eight) hours.  0  . SUMAtriptan (IMITREX) 50 MG tablet Take 50 mg by mouth as needed for migraine. May repeat in 2 hours if headache persists or recurs.    Marland Kitchen tinidazole (TINDAMAX) 500 MG tablet Take 4 tablets (2,000 mg total) by mouth daily with breakfast. 8 tablet 2  . topiramate (TOPAMAX) 100 MG tablet Take 100 mg by mouth 2 (two) times daily.    . vitamin C (ASCORBIC ACID) 500 MG tablet Take 500 mg by mouth daily.     No current facility-administered medications for  this visit.    Past Medical History  Diagnosis Date  . Migraines   . Gallstones   . 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 Morbid obesity   . Arthritis   . GERD (gastroesophageal reflux disease)     takes Omeprazole daily  . Nausea     takes Phenergan as needed  . PIH (pregnancy induced hypertension)     takes Coreg,Enalapril,and Amlodipine daily  . History of migraine   . Carpal tunnel syndrome of right wrist   . Osteoarthritis     takes Relafen daily  . Chronic back pain     Past Surgical History  Procedure Laterality Date  . Cesarean section  2011  . Tubal ligation  2011  . Cholecystectomy N/A 01/23/2013    Procedure: LAPAROSCOPIC CHOLECYSTECTOMY;  Surgeon: Atilano Ina, MD;  [facility];  Service: General;  Laterality: N/A;    ROS:  As stated in the HPI and negative for all other systems.  PHYSICAL EXAM  BP 116/78 mmHg  Pulse 84  Ht  (1.651 m)  Wt 282 lb 4.8 oz (128.05 kg)  BMI 46.98 kg/m2 GENERAL:  Well appearing NECK:  No jugular venous distention, waveform within normal limits, carotid upstroke brisk and  symmetric, no bruits, no thyromegaly LUNGS:  Clear to auscultation bilaterally BACK:  No CVA tenderness CHEST:  Unremarkable HEART:  PMI not displaced or sustained,S1 and S2 within normal limits, no S3, no S4, no clicks, no rubs, no murmurs ABD:  Flat, positive bowel sounds normal in frequency in pitch, no bruits, no rebound, no guarding, no midline pulsatile mass, no hepatomegaly, no splenomegaly, morbid obesity EXT:  2 plus pulses throughout, no edema, no cyanosis no clubbing   EKG:  Sinus rhythm, rate 84, axis within normal limits, intervals within normal limits, no acute ST-T wave changes.  07/01/2014  ASSESSMENT AND PLAN  CARDIOMYOPATHY:  Her ejection fraction was improved and I would suspect that it's still the same. No med changes are indicated. No further cardiovascular testing is indicated. She can follow-up as  needed.  ESSENTIAL HYPERTENSION:  Her blood pressure is controlled on current meds. If as she loses weight her blood pressure goes down I would back off on her Norvasc.  OBESITY, MORBID  I congratulated her on her weight loss.

## 2014-08-24 ENCOUNTER — Emergency Department (HOSPITAL_COMMUNITY): Payer: No Typology Code available for payment source

## 2014-08-24 ENCOUNTER — Encounter (HOSPITAL_COMMUNITY): Payer: Self-pay | Admitting: Emergency Medicine

## 2014-08-24 ENCOUNTER — Emergency Department (HOSPITAL_COMMUNITY)
Admission: EM | Admit: 2014-08-24 | Discharge: 2014-08-24 | Disposition: A | Discharge status: Home / Self Care | Payer: No Typology Code available for payment source | Attending: Emergency Medicine | Admitting: Emergency Medicine

## 2014-08-24 DIAGNOSIS — S29001A Unspecified injury of muscle and tendon of front wall of thorax, initial encounter: Secondary | ICD-10-CM | POA: Diagnosis present

## 2014-08-24 DIAGNOSIS — R079 Chest pain, unspecified: Secondary | ICD-10-CM

## 2014-08-24 DIAGNOSIS — M1990 Unspecified osteoarthritis, unspecified site: Secondary | ICD-10-CM | POA: Diagnosis not present

## 2014-08-24 DIAGNOSIS — K219 Gastro-esophageal reflux disease without esophagitis: Secondary | ICD-10-CM | POA: Insufficient documentation

## 2014-08-24 DIAGNOSIS — Y998 Other external cause status: Secondary | ICD-10-CM | POA: Diagnosis not present

## 2014-08-24 DIAGNOSIS — G8929 Other chronic pain: Secondary | ICD-10-CM | POA: Diagnosis not present

## 2014-08-24 DIAGNOSIS — Z8679 Personal history of other diseases of the circulatory system: Secondary | ICD-10-CM | POA: Diagnosis not present

## 2014-08-24 DIAGNOSIS — Y92410 Unspecified street and highway as the place of occurrence of the external cause: Secondary | ICD-10-CM | POA: Diagnosis not present

## 2014-08-24 DIAGNOSIS — S24109A Unspecified injury at unspecified level of thoracic spinal cord, initial encounter: Secondary | ICD-10-CM | POA: Insufficient documentation

## 2014-08-24 DIAGNOSIS — Y9389 Activity, other specified: Secondary | ICD-10-CM | POA: Insufficient documentation

## 2014-08-24 LAB — CBC
HCT: 37 % (ref 36.0–46.0)
Hemoglobin: 12.2 g/dL (ref 12.0–15.0)
MCH: 29 pg (ref 26.0–34.0)
MCHC: 33 g/dL (ref 30.0–36.0)
MCV: 87.9 fL (ref 78.0–100.0)
PLATELETS: 363 10*3/uL (ref 150–400)
RBC: 4.21 MIL/uL (ref 3.87–5.11)
RDW: 14.6 % (ref 11.5–15.5)
WBC: 8.9 10*3/uL (ref 4.0–10.5)

## 2014-08-24 LAB — BASIC METABOLIC PANEL
Anion gap: 7 (ref 5–15)
BUN: 7 mg/dL (ref 6–20)
CALCIUM: 8.9 mg/dL (ref 8.9–10.3)
CO2: 19 mmol/L — AB (ref 22–32)
CREATININE: 0.78 mg/dL (ref 0.44–1.00)
Chloride: 111 mmol/L (ref 101–111)
GFR calc non Af Amer: >60 mL/min (ref >60)
Glucose, Bld: 92 mg/dL (ref 65–99)
Potassium: 4.2 mmol/L (ref 3.5–5.1)
SODIUM: 137 mmol/L (ref 135–145)

## 2014-08-24 LAB — TROPONIN I: Troponin I: <0.03 ng/mL (ref <0.031)

## 2014-08-24 MED ORDER — KETOROLAC TROMETHAMINE 15 MG/ML IJ SOLN
15.0000 mg | Freq: Once | INTRAMUSCULAR | Status: AC
  Administered 2014-08-24: 15 mg via INTRAVENOUS
  Filled 2014-08-24: qty 1

## 2014-08-24 MED ORDER — ASPIRIN 81 MG PO CHEW
324.0000 mg | CHEWABLE_TABLET | Freq: Once | ORAL | Status: DC
  Filled 2014-08-24: qty 4

## 2014-08-24 MED ORDER — OXYCODONE-ACETAMINOPHEN 5-325 MG PO TABS
2.0000 | ORAL_TABLET | Freq: Once | ORAL | Status: AC
  Administered 2014-08-24: 2 via ORAL
  Filled 2014-08-24: qty 2

## 2014-08-24 MED ORDER — LORAZEPAM 1 MG PO TABS
1.0000 mg | ORAL_TABLET | Freq: Once | ORAL | Status: AC
  Administered 2014-08-24: 1 mg via ORAL
  Filled 2014-08-24: qty 1

## 2014-08-24 NOTE — ED Notes (Signed)
MD at bedside. 
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
## 2014-08-24 NOTE — ED Notes (Signed)
Pt reports tenderness to palpation in rt upper back.

## 2015-09-28 ENCOUNTER — Encounter: Payer: Self-pay | Admitting: Obstetrics

## 2015-09-28 ENCOUNTER — Ambulatory Visit (INDEPENDENT_AMBULATORY_CARE_PROVIDER_SITE_OTHER): Payer: Medicaid Other | Admitting: Obstetrics

## 2015-09-28 VITALS — BP 119/79 | HR 92 | Ht 65.0 in | Wt 291.0 lb

## 2015-09-28 DIAGNOSIS — Z01419 Encounter for gynecological examination (general) (routine) without abnormal findings: Secondary | ICD-10-CM | POA: Diagnosis not present

## 2015-09-28 DIAGNOSIS — E669 Obesity, unspecified: Secondary | ICD-10-CM

## 2015-09-28 DIAGNOSIS — Z0000 Encounter for general adult medical examination without abnormal findings: Secondary | ICD-10-CM

## 2015-09-28 DIAGNOSIS — Z124 Encounter for screening for malignant neoplasm of cervix: Secondary | ICD-10-CM

## 2015-09-28 NOTE — Progress Notes (Signed)
Subjective:        Allison [name] is a 35 y.o. female here for a routine exam.  Current complaints: None.    Personal health questionnaire:  Is patient [name], have a family history of breast and/or ovarian cancer: no Is there a family history of uterine cancer diagnosed at age < 61, gastrointestinal cancer, urinary tract cancer, family member who is a Personnel officer syndrome-associated carrier: no Is the patient overweight and hypertensive, family history of diabetes, personal history of gestational diabetes, preeclampsia or PCOS: no Is patient over 20, have PCOS,  family history of premature CHD under age 67, diabetes, smoke, have hypertension or peripheral artery disease:  no At any time, has a partner hit, kicked or otherwise hurt or frightened you?: no Over the past 2 weeks, have you felt down, depressed or hopeless?: no Over the past 2 weeks, have you felt little interest or pleasure in doing things?:no   Gynecologic History Patient's last menstrual period was 09/23/2015. Contraception: tubal ligation Last Pap: 2015. Results were: normal Last mammogram: n/a. Results were: n/a  Obstetric History OB History  Gravida Para Term Preterm AB Living  3 3 3     3   SAB TAB Ectopic Multiple Live Births          3    # Outcome Date GA Lbr Len/2nd Weight Sex Delivery Anes PTL Lv  3 Term 06/28/09 [redacted]w[redacted]d  5 lb (2.268 kg) M CS-LTranv EPI N LIV     Birth Comments: PIH, Congestive Heart Failure  2 Term 04/16/05 [redacted]w[redacted]d  7 lb 8 oz (3.402 kg) F Vag-Spont EPI N LIV  1 Term 02/06/01 [redacted]w[redacted]d  5 lb 7 oz (2.466 kg) F Vag-Spont EPI N LIV      Past Medical History:  Diagnosis Date  . Arthritis   . 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 Carpal tunnel syndrome of right wrist   . Chronic back pain   . Gallstones   . GERD (gastroesophageal reflux disease)    takes Omeprazole daily  . History of migraine   . Migraines   . Morbid obesity (HCC)   . Nausea    takes Phenergan as  needed  . Osteoarthritis    takes Relafen daily  . PIH (pregnancy induced hypertension)    takes Coreg,Enalapril,and Amlodipine daily    Past Surgical History:  Procedure Laterality Date  . CESAREAN SECTION  2011  . CHOLECYSTECTOMY N/A 01/23/2013   Procedure: LAPAROSCOPIC CHOLECYSTECTOMY;  Surgeon: Atilano Ina, MD;  Location: [facility] OR;  Service: General;  Laterality: N/A;  . TUBAL LIGATION  2011     Current Outpatient Prescriptions:  .  amLODipine (NORVASC) 5 MG tablet, Take 5 mg by mouth daily., Disp: , Rfl: 3 .  carvedilol (COREG) 12.5 MG tablet, Take 12.5 mg by mouth 2 (two) times daily with a meal.  , Disp: , Rfl:  .  cholecalciferol (VITAMIN D) 1000 UNITS tablet, Take 1,000 Units by mouth daily., Disp: , Rfl:  .  cyclobenzaprine (FLEXERIL) 5 MG tablet, Take 5 mg by mouth 2 (two) times daily., Disp: , Rfl: 2 .  DENTA 5000 PLUS 1.1 % CREA dental cream, Take 1 application by mouth daily., Disp: , Rfl: 6 .  diclofenac (VOLTAREN) 75 MG EC tablet, Take 75 mg by mouth 2 (two) times daily., Disp: , Rfl: 2 .  enalapril (VASOTEC) 10 MG tablet, Take 10 mg by mouth daily.  , Disp: , Rfl:  .  gabapentin (NEURONTIN) 300  MG capsule, Take 300 mg by mouth 2 (two) times daily., Disp: , Rfl:  .  omeprazole (PRILOSEC) 20 MG capsule, Take 20 mg by mouth 2 (two) times daily., Disp: , Rfl:  .  oxyCODONE-acetaminophen (PERCOCET) 10-325 MG per tablet, Take 1 tablet by mouth every 8 (eight) hours., Disp: , Rfl: 0 .  SUMAtriptan (IMITREX) 50 MG tablet, Take 50 mg by mouth as needed for migraine. May repeat in 2 hours if headache persists or recurs., Disp: , Rfl:  .  topiramate (TOPAMAX) 100 MG tablet, Take 100 mg by mouth 2 (two) times daily., Disp: , Rfl:  .  vitamin C (ASCORBIC ACID) 500 MG tablet, Take 500 mg by mouth daily., Disp: , Rfl:  .  zolpidem (AMBIEN) 5 MG tablet, Take 5 mg by mouth at bedtime as needed for sleep., Disp: , Rfl:  Allergies  Allergen Reactions  . Reglan [Metoclopramide] Anxiety     Social History  Substance Use Topics  . Smoking status: Never Smoker  . Smokeless tobacco: Never Used  . Alcohol use No    Family History  Problem Relation Age of Onset  . Hypertension Mother   . Diabetes Mother   . Heart disease Mother   . Miscarriages / [place] Mother   . COPD Mother   . Arthritis Mother   . Alcohol abuse Father   . Asthma Father   . Diabetes Maternal Grandmother   . Heart disease Maternal Grandmother   . Cancer Maternal Grandfather     leukemia  . Arthritis Maternal Grandfather   . Cancer Paternal Grandmother     breast  . Cancer Paternal Grandfather     leukemia      Review of Systems  Constitutional: negative for fatigue and weight loss Respiratory: negative for cough and wheezing Cardiovascular: negative for chest pain, fatigue and palpitations Gastrointestinal: negative for abdominal pain and change in bowel habits Musculoskeletal:negative for myalgias Neurological: negative for gait problems and tremors Behavioral/Psych: negative for abusive relationship, depression Endocrine: negative for temperature intolerance   Genitourinary:negative for abnormal menstrual periods, genital lesions, hot flashes, sexual problems and vaginal discharge Integument/breast: negative for breast lump, breast tenderness, nipple discharge and skin lesion(s)    Objective:       BP 119/79   Pulse 92   Ht 5\' 5"  (1.651 m)   Wt 291 lb (132 kg)   LMP 09/23/2015   BMI 48.42 kg/m  General:   alert  Skin:   no rash or abnormalities  Lungs:   clear to auscultation bilaterally  Heart:   regular rate and rhythm, S1, S2 normal, no murmur, click, rub or gallop  Breasts:   normal without suspicious masses, skin or nipple changes or axillary nodes  Abdomen:  normal findings: no organomegaly, soft, non-tender and no hernia  Pelvis:  External genitalia: normal general appearance Urinary system: urethral meatus normal and bladder without fullness, nontender Vaginal:  normal without tenderness, induration or masses Cervix: normal appearance Adnexa: normal bimanual exam Uterus: anteverted and non-tender, normal size   Lab Review Urine pregnancy test Labs reviewed yes Radiologic studies reviewed no  50% of 20 min visit spent on counseling and coordination of care.   Assessment:    Healthy female exam.    Obesity   Plan:    Education reviewed: calcium supplements, low fat, low cholesterol diet, safe sex/STD prevention, self breast exams and weight bearing exercise. Contraception: tubal ligation. Follow up in: 1 year.   Meds ordered this encounter  Medications  .  zolpidem (AMBIEN) 5 MG tablet    Sig: Take 5 mg by mouth at bedtime as needed for sleep.   Orders Placed This Encounter  Procedures  . NuSwab Vaginitis Plus (VG+)     Patient ID: Tomma RakersSeamon M Czaja, female   DOB: 31-Jan-1980, [age] y.o.   MRN: [id number]

## 2015-09-29 ENCOUNTER — Encounter: Payer: Self-pay | Admitting: Obstetrics

## 2015-09-29 LAB — HIV ANTIBODY (ROUTINE TESTING W REFLEX): HIV Screen 4th Generation wRfx: NONREACTIVE

## 2015-09-29 LAB — HEPATITIS C ANTIBODY: Hep C Virus Ab: <0.1 s/co ratio (ref 0.0–0.9)

## 2015-09-29 LAB — HEPATITIS B SURFACE ANTIGEN: Hepatitis B Surface Ag: NEGATIVE

## 2015-09-29 LAB — RPR: RPR: NONREACTIVE

## 2015-09-29 NOTE — Progress Notes (Signed)
HPI The patient presents for followup of nonischemic cardiomyopathy with an ejection fraction as low as 25% followup echo in 2012 and echoin Dec 2014 demonstrated it was up to 60%.  She has been OK from a cardiac standpoint.  However, she has significant back and hip pain and she is limited by this.  She still is able to walk for exercise however.  The patient denies any new symptoms such as chest discomfort, neck or arm discomfort. There has been no new shortness of breath, PND or orthopnea. There have been no reported palpitations, presyncope or syncope.   Allergies  Allergen Reactions  . Reglan [Metoclopramide] Anxiety    Current Outpatient Prescriptions  Medication Sig Dispense Refill  . amLODipine (NORVASC) 5 MG tablet Take 5 mg by mouth daily.  3  . carvedilol (COREG) 12.5 MG tablet Take 12.5 mg by mouth 2 (two) times daily with a meal.      . cholecalciferol (VITAMIN D) 1000 UNITS tablet Take 1,000 Units by mouth daily.    . cyclobenzaprine (FLEXERIL) 5 MG tablet Take 5 mg by mouth 2 (two) times daily.  2  . DENTA 5000 PLUS 1.1 % CREA dental cream Take 1 application by mouth daily.  6  . diclofenac (VOLTAREN) 75 MG EC tablet Take 75 mg by mouth 2 (two) times daily.  2  . enalapril (VASOTEC) 10 MG tablet Take 10 mg by mouth daily.      Marland Kitchen. gabapentin (NEURONTIN) 300 MG capsule Take 300 mg by mouth 2 (two) times daily.    Marland Kitchen. omeprazole (PRILOSEC) 20 MG capsule Take 20 mg by mouth 2 (two) times daily.    Marland Kitchen. oxyCODONE-acetaminophen (PERCOCET) 10-325 MG per tablet Take 1 tablet by mouth every 8 (eight) hours.  0  . SUMAtriptan (IMITREX) 50 MG tablet Take 50 mg by mouth as needed for migraine. May repeat in 2 hours if headache persists or recurs.    . topiramate (TOPAMAX) 100 MG tablet Take 100 mg by mouth 2 (two) times daily.    . vitamin C (ASCORBIC ACID) 500 MG tablet Take 500 mg by mouth daily.    Marland Kitchen. zolpidem (AMBIEN) 5 MG tablet Take 5 mg by mouth at bedtime as needed for sleep.     No  current facility-administered medications for this visit.     Past Medical History:  Diagnosis Date  . Arthritis   . 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 Carpal tunnel syndrome of right wrist   . Chronic back pain   . Gallstones   . GERD (gastroesophageal reflux disease)    takes Omeprazole daily  . History of migraine   . Migraines   . Morbid obesity (HCC)   . Nausea    takes Phenergan as needed  . Osteoarthritis    takes Relafen daily  . PIH (pregnancy induced hypertension)    takes Coreg,Enalapril,and Amlodipine daily    Past Surgical History:  Procedure Laterality Date  . CESAREAN SECTION  2011  . CHOLECYSTECTOMY N/A 01/23/2013   Procedure: LAPAROSCOPIC CHOLECYSTECTOMY;  Surgeon: Atilano InaEric M Wilson, MD;  [facility];  Service: General;  Laterality: N/A;  . TUBAL LIGATION  2011    ROS:   As stated in the HPI and negative for all other systems.  PHYSICAL EXAM  BP 116/82   Pulse 99   Ht 5\' 5"  (1.651 m)   Wt 289 lb (131.1 kg)   LMP 09/23/2015   BMI 48.09 kg/m  GENERAL:  Well appearing NECK:  No jugular venous distention, waveform within normal limits, carotid upstroke brisk and symmetric, no bruits, no thyromegaly LUNGS:  Clear to auscultation bilaterally BACK:  No CVA tenderness CHEST:  Unremarkable HEART:  PMI not displaced or sustained,S1 and S2 within normal limits, no S3, no S4, no clicks, no rubs, no murmurs ABD:  Flat, positive bowel sounds normal in frequency in pitch, no bruits, no rebound, no guarding, no midline pulsatile mass, no hepatomegaly, no splenomegaly, morbid obesity EXT:  2 plus pulses throughout, no edema, no cyanosis no clubbing   EKG:  Sinus rhythm, rate 99, axis within normal limits, intervals within normal limits, no acute ST-T wave changes.  09/30/2015  ASSESSMENT AND PLAN  CARDIOMYOPATHY:   Her ejection fraction was improved and I would suspect that it's still the same. No med changes are indicated. No further cardiovascular  testing is indicated.   ESSENTIAL HYPERTENSION:  Her blood pressure is controlled on current meds.   OBESITY, MORBID  I congratulated her on her weight loss.  She has hit a plateau though she has significant effort she can't seem to lose more I sent a note to Dr. Daphine Deutscher to see if Medicaid will cover bariatric surgery as she is interested in this.

## 2015-09-30 ENCOUNTER — Ambulatory Visit (INDEPENDENT_AMBULATORY_CARE_PROVIDER_SITE_OTHER): Payer: Medicaid Other | Admitting: Cardiology

## 2015-09-30 ENCOUNTER — Encounter: Payer: Self-pay | Admitting: Cardiology

## 2015-09-30 VITALS — BP 116/82 | HR 99 | Ht 65.0 in | Wt 289.0 lb

## 2015-09-30 DIAGNOSIS — I10 Essential (primary) hypertension: Secondary | ICD-10-CM | POA: Diagnosis not present

## 2015-09-30 DIAGNOSIS — I420 Dilated cardiomyopathy: Secondary | ICD-10-CM | POA: Diagnosis not present

## 2015-09-30 NOTE — Patient Instructions (Signed)

## 2015-10-01 LAB — NUSWAB VAGINITIS PLUS (VG+)
CANDIDA ALBICANS, NAA: NEGATIVE
CANDIDA GLABRATA, NAA: NEGATIVE
Chlamydia trachomatis, NAA: NEGATIVE
Neisseria gonorrhoeae, NAA: NEGATIVE
Trich vag by NAA: NEGATIVE

## 2015-10-04 LAB — PAP IG AND HPV HIGH-RISK
HPV, high-risk: NEGATIVE
PAP Smear Comment: 0

## 2015-11-04 ENCOUNTER — Other Ambulatory Visit (HOSPITAL_BASED_OUTPATIENT_CLINIC_OR_DEPARTMENT_OTHER): Payer: Self-pay

## 2015-11-04 DIAGNOSIS — G4700 Insomnia, unspecified: Secondary | ICD-10-CM

## 2015-11-04 DIAGNOSIS — G4710 Hypersomnia, unspecified: Secondary | ICD-10-CM

## 2015-11-04 DIAGNOSIS — R5383 Other fatigue: Secondary | ICD-10-CM

## 2015-11-16 ENCOUNTER — Ambulatory Visit (HOSPITAL_BASED_OUTPATIENT_CLINIC_OR_DEPARTMENT_OTHER): Payer: Medicaid Other | Attending: Physician Assistant | Admitting: Internal Medicine

## 2015-11-16 DIAGNOSIS — G4710 Hypersomnia, unspecified: Secondary | ICD-10-CM

## 2015-11-16 DIAGNOSIS — R5383 Other fatigue: Secondary | ICD-10-CM

## 2015-11-16 DIAGNOSIS — R0683 Snoring: Secondary | ICD-10-CM | POA: Insufficient documentation

## 2015-11-16 DIAGNOSIS — G4700 Insomnia, unspecified: Secondary | ICD-10-CM

## 2015-11-22 DIAGNOSIS — G4710 Hypersomnia, unspecified: Secondary | ICD-10-CM | POA: Diagnosis not present

## 2015-11-22 NOTE — Procedures (Signed)
  Patient Name: Allison [name], Seamone Study Date: 11/16/2015 Gender: Female D.O.B: [birth year] Age (years): 35 Referring Provider: Norva RiffleAshley Vanstory Height (inches): 65 Interpreting Physician: Jetty Duhamellinton Young MD, ABSM Weight (lbs): 292 RPSGT: Elaina Patteeeeriemer, Holly BMI: 49 MRN: [id number] Neck Size: 15.50 CLINICAL INFORMATION Sleep Study Type: NPSG Indication for sleep study: Excessive Daytime Sleepiness, Fatigue Epworth Sleepiness Score: 5  SLEEP STUDY TECHNIQUE As per the AASM Manual for the Scoring of Sleep and Associated Events v2.3 (April 2016) with a hypopnea requiring 4% desaturations. The channels recorded and monitored were frontal, central and occipital EEG, electrooculogram (EOG), submentalis EMG (chin), nasal and oral airflow, thoracic and abdominal wall motion, anterior tibialis EMG, snore microphone, electrocardiogram, and pulse oximetry.  MEDICATIONS Medications self-administered by patient taken the night of the study : GABAPENTIN, DICLOFENAC SOD. EC, TOPIMAX, AMITRIPTYLINE HCL, PERCOCET, CARVEDILOL, CYCLOBENZAPRINE HCL, AMBIEN  SLEEP ARCHITECTURE The study was initiated at 10:19:13 PM and ended at 4:35:07 AM. Sleep onset time was 23.0 minutes and the sleep efficiency was 91.6%. The total sleep time was 344.4 minutes. Stage REM latency was 82.5 minutes. The patient spent 1.74% of the night in stage N1 sleep, 89.40% in stage N2 sleep, 0.00% in stage N3 and 8.86% in REM. Alpha intrusion was absent. Supine sleep was 27.05%.  RESPIRATORY PARAMETERS The overall apnea/hypopnea index (AHI) was 0.2 per hour. There were 0 total apneas, including 0 obstructive, 0 central and 0 mixed apneas. There were 1 hypopneas and 12 RERAs. The AHI during Stage REM sleep was 2.0 per hour. AHI while supine was 0.0 per hour. The mean oxygen saturation was 98.07%. The minimum SpO2 during sleep was 94.00%. Loud snoring was noted during this study.  CARDIAC DATA The 2 lead EKG demonstrated sinus  rhythm. The mean heart rate was 89.78 beats per minute. Other EKG findings include: None.  LEG MOVEMENT DATA The total PLMS were 0 with a resulting PLMS index of 0.00. Associated arousal with leg movement index was 0.0 .  IMPRESSIONS - No significant obstructive sleep apnea occurred during this study (AHI = 0.2/h). - No significant central sleep apnea occurred during this study (CAI = 0.0/h). - The patient had minimal or no oxygen desaturation during the study (Min O2 = 94.00%) - The patient snored with Loud snoring volume. - No cardiac abnormalities were noted during this study. - Clinically significant periodic limb movements did not occur during sleep. No significant associated arousals. - Medicated sleep pattern with high percentage of time spent in stage N2, reflecting significant list of sedating meds taken as listed above.   DIAGNOSIS - Primary Snoring (786.09 [R06.83 ICD-10]) RECOMMENDATIONS   - Sleep hygiene should be reviewed to assess factors that may improve sleep quality. - Weight management and regular exercise should be initiated or continued if appropriate. - Reconsider use of sedating medications to ensure best management for patient care, if appropriate.  [Electronically signed] 11/22/2015 03:20 PM  Jetty Duhamellinton Young MD, ABSM Diplomate, American Board of Sleep Medicine   NPI: [id number]  Waymon BudgeYOUNG,CLINTON D Diplomate, American Board of Sleep Medicine  ELECTRONICALLY SIGNED ON:  11/22/2015, 3:14 PM [facility] PH: [phone]   FX: [phone] ACCREDITED BY THE AMERICAN ACADEMY OF SLEEP MEDICINE

## 2016-01-13 ENCOUNTER — Other Ambulatory Visit: Payer: Self-pay | Admitting: Neurosurgery

## 2016-01-13 DIAGNOSIS — M5412 Radiculopathy, cervical region: Secondary | ICD-10-CM

## 2016-01-17 ENCOUNTER — Ambulatory Visit
Admission: RE | Admit: 2016-01-17 | Discharge: 2016-01-17 | Disposition: A | Discharge status: Home / Self Care | Payer: Medicaid Other | Source: Ambulatory Visit | Attending: Neurosurgery | Admitting: Neurosurgery

## 2016-01-17 DIAGNOSIS — M5412 Radiculopathy, cervical region: Secondary | ICD-10-CM

## 2016-02-01 ENCOUNTER — Other Ambulatory Visit: Payer: Self-pay | Admitting: Neurosurgery

## 2016-02-01 DIAGNOSIS — M25512 Pain in left shoulder: Secondary | ICD-10-CM

## 2016-02-09 ENCOUNTER — Ambulatory Visit
Admission: RE | Admit: 2016-02-09 | Discharge: 2016-02-09 | Disposition: A | Discharge status: Home / Self Care | Payer: Medicaid Other | Source: Ambulatory Visit | Attending: Neurosurgery | Admitting: Neurosurgery

## 2016-02-09 DIAGNOSIS — M25512 Pain in left shoulder: Secondary | ICD-10-CM

## 2016-04-04 ENCOUNTER — Other Ambulatory Visit (HOSPITAL_COMMUNITY)
Admission: RE | Admit: 2016-04-04 | Discharge: 2016-04-04 | Disposition: A | Discharge status: Home / Self Care | Payer: Medicaid Other | Source: Ambulatory Visit | Attending: Obstetrics and Gynecology | Admitting: Obstetrics and Gynecology

## 2016-04-04 ENCOUNTER — Ambulatory Visit (INDEPENDENT_AMBULATORY_CARE_PROVIDER_SITE_OTHER): Payer: Medicaid Other | Admitting: Obstetrics and Gynecology

## 2016-04-04 ENCOUNTER — Encounter: Payer: Self-pay | Admitting: Obstetrics and Gynecology

## 2016-04-04 DIAGNOSIS — L292 Pruritus vulvae: Secondary | ICD-10-CM | POA: Diagnosis not present

## 2016-04-04 DIAGNOSIS — R300 Dysuria: Secondary | ICD-10-CM | POA: Diagnosis not present

## 2016-04-04 DIAGNOSIS — L298 Other pruritus: Secondary | ICD-10-CM

## 2016-04-04 DIAGNOSIS — N898 Other specified noninflammatory disorders of vagina: Secondary | ICD-10-CM | POA: Insufficient documentation

## 2016-04-04 NOTE — Progress Notes (Signed)
Patient ID: Tomma RakersSeamon M Soileau, female   DOB: 20-Jun-1980, 36 y.o.   MRN: [id number] Ms Jean RosenthalJackson presents with a 2 day H/O of vaginal itching and dysuria. Sx started after IC this past Sunday. Same partner x 2 years. No H/O STD. BTL for contraception. Last pap and STD testing this past September, all normal. Denies any recent exposure or antibiotic use. Cycles monthly.  Tried OTC vagisel and vinger water without relief.  PE AF VSS Lungs clear Heart RRR Abd soft + BS obese GU nl EGBUS, white/yellowish discharge noted, no lesions noted  A/P Vaginal itching        Dysuria   Will check NuSwab. Urine culture. Will base treatment on results. Pt to use A & D onit external as needed in the meantime.

## 2016-04-04 NOTE — Progress Notes (Signed)
Patient presents for Vaginal itching, denies odor and discharge. It burns when she urinates.  UA: Trace-Protein; Negative-Glucose   She did not take her Bp Meds. today.

## 2016-04-05 LAB — CERVICOVAGINAL ANCILLARY ONLY
Bacterial vaginitis: NEGATIVE
CANDIDA VAGINITIS: POSITIVE — AB
CHLAMYDIA, DNA PROBE: NEGATIVE
Neisseria Gonorrhea: NEGATIVE
TRICH (WINDOWPATH): NEGATIVE

## 2016-04-08 LAB — URINE CULTURE

## 2016-04-17 ENCOUNTER — Other Ambulatory Visit: Payer: Self-pay | Admitting: *Deleted

## 2016-04-17 DIAGNOSIS — B379 Candidiasis, unspecified: Secondary | ICD-10-CM

## 2016-04-17 MED ORDER — FLUCONAZOLE 150 MG PO TABS: 150.0000 mg | ORAL_TABLET | ORAL | 0 refills | Status: DC

## 2016-04-17 NOTE — Progress Notes (Signed)
Diflucan sent per Dr Alysia Penna order on lab note.

## 2016-05-01 ENCOUNTER — Ambulatory Visit (INDEPENDENT_AMBULATORY_CARE_PROVIDER_SITE_OTHER): Payer: Medicaid Other | Admitting: Pulmonary Disease

## 2016-05-01 ENCOUNTER — Encounter: Payer: Self-pay | Admitting: Pulmonary Disease

## 2016-05-01 VITALS — BP 104/62 | HR 84

## 2016-05-01 DIAGNOSIS — G4721 Circadian rhythm sleep disorder, delayed sleep phase type: Secondary | ICD-10-CM

## 2016-05-01 MED ORDER — MELATONIN 3 MG PO CAPS: 3.0000 mg | ORAL_CAPSULE | Freq: Every day | ORAL | 0 refills | Status: DC

## 2016-05-01 NOTE — Progress Notes (Signed)
Past Surgical History She  has a past surgical history that includes Cesarean section (2011); Tubal ligation (2011); and Cholecystectomy (N/A, 01/23/2013).  Allergies  Allergen Reactions  . Cymbalta [Duloxetine Hcl]   . Reglan [Metoclopramide] Anxiety    Family History Her family history includes Alcohol abuse in her father; Arthritis in her maternal grandfather and mother; Asthma in her father; COPD in her mother; Cancer in her maternal grandfather, paternal grandfather, and paternal grandmother; Diabetes in her maternal grandmother and mother; Heart disease in her maternal grandmother and mother; Hypertension in her mother; Miscarriages / [place] in her mother.  Social History She  reports that she has never smoked. She has never used smokeless tobacco. She reports that she does not drink alcohol or use drugs.  Review of systems Constitutional: Positive for unexpected weight change. Negative for fever.  HENT: Negative for congestion, dental problem, ear pain, nosebleeds, postnasal drip, rhinorrhea, sinus pressure, sneezing, sore throat and trouble swallowing.   Eyes: Negative for redness and itching.  Respiratory: Negative for cough, chest tightness, shortness of breath and wheezing.   Cardiovascular: Negative for palpitations and leg swelling.  Gastrointestinal: Negative for nausea and vomiting.  Genitourinary: Negative for dysuria.  Musculoskeletal: Positive for arthralgias and joint swelling.  Skin: Negative for rash.  Neurological: Positive for headaches.  Hematological: Does not bruise/bleed easily.  Psychiatric/Behavioral: Negative for dysphoric mood. The patient is not nervous/anxious.     Current Outpatient Prescriptions on File Prior to Visit  Medication Sig  . amLODipine (NORVASC) 5 MG tablet Take 5 mg by mouth daily.  . carvedilol (COREG) 12.5 MG tablet Take 12.5 mg by mouth 2 (two) times daily with a meal.    . cholecalciferol (VITAMIN D) 1000 UNITS tablet Take  1,000 Units by mouth daily.  . cyclobenzaprine (FLEXERIL) 5 MG tablet Take 5 mg by mouth 2 (two) times daily.  . DENTA 5000 PLUS 1.1 % CREA dental cream Take 1 application by mouth daily.  . diclofenac (VOLTAREN) 75 MG EC tablet Take 75 mg by mouth 2 (two) times daily.  . enalapril (VASOTEC) 10 MG tablet Take 10 mg by mouth daily.    Marland Kitchen gabapentin (NEURONTIN) 300 MG capsule Take 300 mg by mouth 3 (three) times daily.   Marland Kitchen omeprazole (PRILOSEC) 20 MG capsule Take 20 mg by mouth 2 (two) times daily.  Marland Kitchen oxyCODONE-acetaminophen (PERCOCET) 10-325 MG per tablet Take 1 tablet by mouth every 8 (eight) hours.  . SUMAtriptan (IMITREX) 50 MG tablet Take 50 mg by mouth as needed for migraine. May repeat in 2 hours if headache persists or recurs.  . topiramate (TOPAMAX) 100 MG tablet Take 100 mg by mouth 2 (two) times daily.  . vitamin C (ASCORBIC ACID) 500 MG tablet Take 500 mg by mouth daily.   No current facility-administered medications on file prior to visit.     Chief Complaint  Patient presents with  . SLEEP CONSULT    Refered by Dr Julio Sicks for Insomnia. Sleep study 2017. Patient tried Ambien and was taken off d/t interaction with current meds. Epworth Score: 9    Sleep tests PSG 11/16/15 >> AHI 0.2, SpO2 low 94%   Past medical history She  has a past medical history of Arthritis; Cardiomyopathy, peripartum, postpartum (06/2009); Carpal tunnel syndrome of right wrist; Chronic back pain; Gallstones; GERD (gastroesophageal reflux disease); History of migraine; Migraines; Morbid obesity (HCC); Nausea; Osteoarthritis; and PIH (pregnancy induced hypertension).  Vital signs BP 104/62 (BP Location: Right Arm, Cuff Size: Large)  Pulse 84   SpO2 100%   History of Present Illness Allison [name] is a 36 y.o. female for evaluation of sleep problems.  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  She works from 2 pm until about 10 pm.  She doesn't get sleepy until  about 3 am.  She has to wake up at 6 am on weekdays to get her kids ready for school.  She needs to fall asleep earlier on weekdays because of this, and has tried several sleep aides for this purpose (hydroxyzine, amitriptyline, cymbalta, and ambien).  Ambien was the only medicine that seemed to help, but she was told this could interfere with her other pain medications.  On weekends she can sleep until 11 am, and feels okay.  If she is able to keep a sleep schedule of 3 am to 11 am, then she feels that she wouldn't need a sleep aide.  She has tried melatonin before, but only for about a week and it is unclear what dose she used.  She snores some, but had sleep study in November 2017 that was negative for sleep apnea.  She doesn't feel like she has any issue with her mood.  She denies sleep walking, sleep talking, bruxism, or nightmares.  There is no history of restless legs.  She denies sleep hallucinations, sleep paralysis, or cataplexy.  The Epworth score is 9 out of 24.   Physical Exam:  General - No distress ENT - No sinus tenderness, no oral exudate, no LAN, no thyromegaly, TM clear, pupils equal/reactive Cardiac - s1s2 regular, no murmur, pulses symmetric Chest - No wheeze/rales/dullness, good air entry, normal respiratory excursion Back - No focal tenderness Abd - Soft, non-tender, no organomegaly, + bowel sounds Ext - No edema Neuro - Normal strength, cranial nerves intact Skin - No rashes Psych - Normal mood, and behavior  Discussion: I believe her main sleep issue is related to delayed sleep phase.  This is impacting by her work schedule and child care requirements.  Assessment/plan:  Delayed sleep phase syndrome.: - Take melatonin at 1030 pm - Go to bed at 11 pm - Get out of bed at 6 am - Avoid bright light close to bed time - Get sun light exposure early in the morning - Keep a sleep diary for at least one week  Follow up in 8 weeks   Coralyn Helling, M.D. Pager  [phone] 05/01/2016, 11:17 AM

## 2016-05-01 NOTE — Patient Instructions (Signed)
Take melatonin at 1030 pm Go to bed at 11 pm Get out of bed at 6 am Avoid bright light close to bed time Get sun light exposure early in the morning Keep a sleep diary for at least one week  Follow up in 8 weeks

## 2016-05-01 NOTE — Progress Notes (Signed)
   Subjective:    Patient ID: Allison [name], female    DOB: September 22, 1980, 36 y.o.   MRN: [id number]  HPI    Review of Systems  Constitutional: Positive for unexpected weight change. Negative for fever.  HENT: Negative for congestion, dental problem, ear pain, nosebleeds, postnasal drip, rhinorrhea, sinus pressure, sneezing, sore throat and trouble swallowing.   Eyes: Negative for redness and itching.  Respiratory: Negative for cough, chest tightness, shortness of breath and wheezing.   Cardiovascular: Negative for palpitations and leg swelling.  Gastrointestinal: Negative for nausea and vomiting.  Genitourinary: Negative for dysuria.  Musculoskeletal: Positive for arthralgias and joint swelling.  Skin: Negative for rash.  Neurological: Positive for headaches.  Hematological: Does not bruise/bleed easily.  Psychiatric/Behavioral: Negative for dysphoric mood. The patient is not nervous/anxious.        Objective:   Physical Exam        Assessment & Plan:

## 2016-07-17 ENCOUNTER — Ambulatory Visit: Payer: Medicaid Other | Admitting: Pulmonary Disease

## 2016-07-19 ENCOUNTER — Other Ambulatory Visit: Payer: Self-pay | Admitting: Neurosurgery

## 2016-07-27 NOTE — Pre-Procedure Instructions (Signed)
Allison [name]  07/27/2016      [id number] [place] - [address] [facility] [address] [place] [id number] Phone: [phone] Fax: [phone]    Your procedure is scheduled on Thursday, July 26th   [facility] at 10:30 AM             (posted surgery time 12:30 pm-1:17pm)   Call this number if you have problems the morning of surgery:  [phone], or for other questions call [phone] Mon-Fri from 8am-4pm   Remember:   Do not eat food or drink liquids after midnight Wednesday.              4-5 days prior to surgery, STOP TAKING any vitamins, herbal supplements, anti-inflammatories.   Take these medicines the morning of surgery with A SIP OF WATER : Norvasc, Carvedilol, Gabapentin, Omeprazole, Oxycodone.   Do not wear jewelry, make-up or nail polish.  Do not wear lotions, powders,  perfumes, or deoderant.  Do not shave 48 hours prior to surgery.    Do not bring valuables to the hospital.  [facility] not [facility] for any belongings or valuables.  Contacts, dentures or bridgework may not be worn into surgery.  Leave your suitcase in the car.  After surgery it may be brought to your room.  Patients discharged the day of surgery will not be allowed to drive home, and you will need someone to stay with you for the first 24 hrs after surgery.  Please read over the following fact sheets that you were given. Pain Booklet and Surgical Site Infection Prevention     [facility]- Preparing For Surgery  Before surgery, you can play an important role. Because skin is not sterile, your skin needs to be as free of germs as possible. You can reduce the number of germs on your skin by washing with CHG (chlorahexidine gluconate) Soap before surgery.  CHG is an antiseptic cleaner which kills germs and bonds with the skin to continue killing germs even after washing.  Please do  not use if you have an allergy to CHG or antibacterial soaps. If your skin becomes reddened/irritated stop using the CHG.  Do not shave (including legs and underarms) for at least 48 hours prior to first CHG shower. It is OK to shave your face.  Please follow these instructions carefully.   1. Shower the NIGHT BEFORE SURGERY and the MORNING OF SURGERY with CHG.   2. If you chose to wash your hair, wash your hair first as usual with your normal shampoo.  3. After you shampoo, rinse your hair and body thoroughly to remove the shampoo.  4. Use CHG as you would any other liquid soap. You can apply CHG directly to the skin and wash gently with a scrungie or a clean washcloth.   5. Apply the CHG Soap to your body ONLY FROM THE NECK DOWN.  Do not use on open wounds or open sores. Avoid contact with your eyes, ears, mouth and genitals (private parts). Wash genitals (private parts) with your normal soap.  6. Wash thoroughly, paying special attention to the area where your surgery will be performed.  7. Thoroughly rinse your body with warm water from the neck down.  8. DO NOT shower/wash with your normal soap after using and rinsing off the CHG Soap.  9. Pat yourself dry with a CLEAN TOWEL.  10. Wear CLEAN PAJAMAS   11. Place CLEAN SHEETS on your bed the night of your first shower and DO NOT SLEEP WITH PETS.    Day of Surgery: Do not apply any deodorants/lotions. Please wear clean clothes to the hospital/surgery center.

## 2016-07-30 ENCOUNTER — Encounter (HOSPITAL_COMMUNITY): Payer: Self-pay

## 2016-07-30 ENCOUNTER — Encounter (HOSPITAL_COMMUNITY)
Admission: RE | Admit: 2016-07-30 | Discharge: 2016-07-30 | Disposition: A | Discharge status: Home / Self Care | Payer: Medicaid Other | Source: Ambulatory Visit | Attending: Neurosurgery | Admitting: Neurosurgery

## 2016-07-30 ENCOUNTER — Encounter (HOSPITAL_COMMUNITY): Payer: Self-pay | Admitting: Vascular Surgery

## 2016-07-30 DIAGNOSIS — M1990 Unspecified osteoarthritis, unspecified site: Secondary | ICD-10-CM | POA: Insufficient documentation

## 2016-07-30 DIAGNOSIS — Z9049 Acquired absence of other specified parts of digestive tract: Secondary | ICD-10-CM | POA: Diagnosis not present

## 2016-07-30 DIAGNOSIS — M549 Dorsalgia, unspecified: Secondary | ICD-10-CM | POA: Diagnosis not present

## 2016-07-30 DIAGNOSIS — G8929 Other chronic pain: Secondary | ICD-10-CM | POA: Insufficient documentation

## 2016-07-30 DIAGNOSIS — I429 Cardiomyopathy, unspecified: Secondary | ICD-10-CM | POA: Diagnosis not present

## 2016-07-30 DIAGNOSIS — R110 Nausea: Secondary | ICD-10-CM | POA: Insufficient documentation

## 2016-07-30 DIAGNOSIS — Z01812 Encounter for preprocedural laboratory examination: Secondary | ICD-10-CM | POA: Diagnosis present

## 2016-07-30 DIAGNOSIS — K219 Gastro-esophageal reflux disease without esophagitis: Secondary | ICD-10-CM | POA: Insufficient documentation

## 2016-07-30 LAB — BASIC METABOLIC PANEL
Anion gap: 6 (ref 5–15)
BUN: 11 mg/dL (ref 6–20)
CALCIUM: 9.2 mg/dL (ref 8.9–10.3)
CO2: 20 mmol/L — ABNORMAL LOW (ref 22–32)
CREATININE: 1.15 mg/dL — AB (ref 0.44–1.00)
Chloride: 111 mmol/L (ref 101–111)
Glucose, Bld: 95 mg/dL (ref 65–99)
Potassium: 3.5 mmol/L (ref 3.5–5.1)
SODIUM: 137 mmol/L (ref 135–145)

## 2016-07-30 LAB — CBC
HCT: 33 % — ABNORMAL LOW (ref 36.0–46.0)
Hemoglobin: 10.5 g/dL — ABNORMAL LOW (ref 12.0–15.0)
MCH: 25.4 pg — AB (ref 26.0–34.0)
MCHC: 31.8 g/dL (ref 30.0–36.0)
MCV: 79.9 fL (ref 78.0–100.0)
PLATELETS: 474 10*3/uL — AB (ref 150–400)
RBC: 4.13 MIL/uL (ref 3.87–5.11)
RDW: 16 % — ABNORMAL HIGH (ref 11.5–15.5)
WBC: 9.6 10*3/uL (ref 4.0–10.5)

## 2016-07-30 LAB — HCG, SERUM, QUALITATIVE: PREG SERUM: NEGATIVE

## 2016-07-30 NOTE — Progress Notes (Signed)
PCP is Dr. Neal DyBonsui @ [facility]  LOV 07/2016 Cardio is Dr. Antoine PocheHochrein  LOV 09/2015   Echo done 09/2015 She states that while pregnant, in 2011, she went into CHF with EF 4% (her words),  has been going to see Dr Antoine PocheHochrein every since. Sees him yearly. Much better now, currently denies any chest discomfort, palpitations, sob.   Had sleep study done x 2, latest one 11/2015 - negative results. She takes Saxenda, injection once daily for weight loss.  I spoke with A. Zelenak, PA to confirm whether she has to stop it or not.  Have informed the patient, that she may take it up until the day of surgery, but NOT the day of.

## 2016-07-31 NOTE — Progress Notes (Signed)
Anesthesia chart review: Patient is a 36 year old female scheduled for right carpal tunnel release on 08/02/2016 by Dr. Cyndy Freeze. Procedure is posted for MAC.  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 BMI is consistent with morbid obesity.   - PCP is Dr. Benito Mccreedy. - Cardiologist is Dr. Minus Breeding, last visit 09/30/15. He did not recommend any new medication changes or cardiovascular testing at that time.  - Pulmonologist is Dr. 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   Meds include amitriptyline, amlodipine, Coreg, Flexeril, enalapril, furosemide, gabapentin, Saxenda (liraglutide, using for weight loss; instructed to hold day of surgery), omeprazole, Imitrex, topiramate.   BP 115/65   Pulse 97   Temp 36.9 C   Resp 20   Ht 5' 5"  (1.651 m)   Wt 298 lb 8 oz (135.4 kg)   LMP 07/30/2016   SpO2 99%   BMI 49.67 kg/m   EKG 09/30/15: NSR, cannot rule out anterior infarct, age undetermined.  Echo 12/09/12: Study Conclusions - Left ventricle: The cavity size was normal. Wall thickness was increased in a pattern of mild LVH. Systolic function was normal. The estimated ejection fraction was in the range of 55% to 60%. Wall motion was normal; there were no regional wall motion abnormalities. Features are consistent with a pseudonormal left ventricular filling pattern, with concomitant abnormal relaxation and increased filling pressure (grade 2 diastolic dysfunction). - Pericardium, extracardiac: A small pericardial effusion was identified.  Preoperative labs noted. Cr 1.15. Glucose 95. H/H 10.5/33.0. PLT 474K. Serum pregnancy test negative.  If no acute changes then I anticipate that she can proceed as planned.  [facility]  Short Stay Center/Anesthesiology Phone [phone] 07/31/2016 1:19 PM

## 2016-08-02 ENCOUNTER — Encounter (HOSPITAL_COMMUNITY)
Admission: RE | Disposition: A | Discharge status: Home / Self Care | Payer: Self-pay | Source: Ambulatory Visit | Attending: Neurosurgery

## 2016-08-02 ENCOUNTER — Ambulatory Visit (HOSPITAL_COMMUNITY): Payer: Medicaid Other | Admitting: Anesthesiology

## 2016-08-02 ENCOUNTER — Ambulatory Visit (HOSPITAL_COMMUNITY)
Admission: RE | Admit: 2016-08-02 | Discharge: 2016-08-02 | Disposition: A | Discharge status: Home / Self Care | Payer: Medicaid Other | Source: Ambulatory Visit | Attending: Neurosurgery | Admitting: Neurosurgery

## 2016-08-02 ENCOUNTER — Other Ambulatory Visit (HOSPITAL_COMMUNITY): Payer: Self-pay | Admitting: *Deleted

## 2016-08-02 ENCOUNTER — Encounter (HOSPITAL_COMMUNITY): Payer: Self-pay | Admitting: *Deleted

## 2016-08-02 DIAGNOSIS — K219 Gastro-esophageal reflux disease without esophagitis: Secondary | ICD-10-CM | POA: Insufficient documentation

## 2016-08-02 DIAGNOSIS — Z79899 Other long term (current) drug therapy: Secondary | ICD-10-CM | POA: Insufficient documentation

## 2016-08-02 DIAGNOSIS — I509 Heart failure, unspecified: Secondary | ICD-10-CM | POA: Diagnosis not present

## 2016-08-02 DIAGNOSIS — Z6841 Body Mass Index (BMI) 40.0 and over, adult: Secondary | ICD-10-CM | POA: Diagnosis not present

## 2016-08-02 DIAGNOSIS — M1990 Unspecified osteoarthritis, unspecified site: Secondary | ICD-10-CM | POA: Insufficient documentation

## 2016-08-02 DIAGNOSIS — G5601 Carpal tunnel syndrome, right upper limb: Secondary | ICD-10-CM | POA: Diagnosis present

## 2016-08-02 DIAGNOSIS — I110 Hypertensive heart disease with heart failure: Secondary | ICD-10-CM | POA: Diagnosis not present

## 2016-08-02 HISTORY — PX: CARPAL TUNNEL RELEASE: SHX101

## 2016-08-02 SURGERY — CARPAL TUNNEL RELEASE
Anesthesia: Moderate Sedation | Laterality: Right

## 2016-08-02 SURGERY — CARPAL TUNNEL RELEASE
Anesthesia: Monitor Anesthesia Care | Site: Wrist | Laterality: Right

## 2016-08-02 MED ORDER — FENTANYL CITRATE (PF) 250 MCG/5ML IJ SOLN
INTRAMUSCULAR | Status: AC
  Filled 2016-08-02: qty 5

## 2016-08-02 MED ORDER — DEXAMETHASONE SODIUM PHOSPHATE 10 MG/ML IJ SOLN
INTRAMUSCULAR | Status: AC
  Filled 2016-08-02: qty 1

## 2016-08-02 MED ORDER — LIDOCAINE 2% (20 MG/ML) 5 ML SYRINGE
INTRAMUSCULAR | Status: AC
  Filled 2016-08-02: qty 5

## 2016-08-02 MED ORDER — ONDANSETRON HCL 4 MG/2ML IJ SOLN: 4.0000 mg | Freq: Four times a day (QID) | INTRAMUSCULAR | Status: DC | PRN

## 2016-08-02 MED ORDER — LIDOCAINE-EPINEPHRINE 0.5 %-1:200000 IJ SOLN
INTRAMUSCULAR | Status: AC
  Filled 2016-08-02: qty 1

## 2016-08-02 MED ORDER — OXYCODONE HCL 5 MG PO TABS
ORAL_TABLET | ORAL | Status: AC
  Administered 2016-08-02: 5 mg via ORAL
  Filled 2016-08-02: qty 1

## 2016-08-02 MED ORDER — PROPOFOL 500 MG/50ML IV EMUL
INTRAVENOUS | Status: DC | PRN
  Administered 2016-08-02: 100 ug/kg/min via INTRAVENOUS

## 2016-08-02 MED ORDER — PHENYLEPHRINE 40 MCG/ML (10ML) SYRINGE FOR IV PUSH (FOR BLOOD PRESSURE SUPPORT)
PREFILLED_SYRINGE | INTRAVENOUS | Status: AC
  Filled 2016-08-02: qty 10

## 2016-08-02 MED ORDER — FENTANYL CITRATE (PF) 100 MCG/2ML IJ SOLN
25.0000 ug | INTRAMUSCULAR | Status: DC | PRN
  Administered 2016-08-02 (×2): 50 ug via INTRAVENOUS

## 2016-08-02 MED ORDER — LIDOCAINE HCL (CARDIAC) 10 MG/ML IV SOLN
INTRAVENOUS | Status: DC | PRN
  Administered 2016-08-02: 80 mg via INTRAVENOUS

## 2016-08-02 MED ORDER — ACETAMINOPHEN-CODEINE #3 300-30 MG PO TABS: 1.0000 | ORAL_TABLET | Freq: Four times a day (QID) | ORAL | 0 refills | Status: DC | PRN

## 2016-08-02 MED ORDER — BACITRACIN ZINC 500 UNIT/GM EX OINT
TOPICAL_OINTMENT | CUTANEOUS | Status: DC | PRN
  Administered 2016-08-02: 1 via TOPICAL

## 2016-08-02 MED ORDER — 0.9 % SODIUM CHLORIDE (POUR BTL) OPTIME
TOPICAL | Status: DC | PRN
  Administered 2016-08-02: 1000 mL

## 2016-08-02 MED ORDER — ROCURONIUM BROMIDE 10 MG/ML (PF) SYRINGE
PREFILLED_SYRINGE | INTRAVENOUS | Status: AC
  Filled 2016-08-02: qty 5

## 2016-08-02 MED ORDER — FENTANYL CITRATE (PF) 100 MCG/2ML IJ SOLN
INTRAMUSCULAR | Status: AC
  Administered 2016-08-02: 50 ug via INTRAVENOUS
  Filled 2016-08-02: qty 2

## 2016-08-02 MED ORDER — LACTATED RINGERS IV SOLN: INTRAVENOUS | Status: DC

## 2016-08-02 MED ORDER — LIDOCAINE-EPINEPHRINE 0.5 %-1:200000 IJ SOLN
INTRAMUSCULAR | Status: DC | PRN
  Administered 2016-08-02: 10 mL

## 2016-08-02 MED ORDER — SUGAMMADEX SODIUM 200 MG/2ML IV SOLN
INTRAVENOUS | Status: AC
  Filled 2016-08-02: qty 2

## 2016-08-02 MED ORDER — MIDAZOLAM HCL 5 MG/5ML IJ SOLN
INTRAMUSCULAR | Status: DC | PRN
  Administered 2016-08-02: 2 mg via INTRAVENOUS

## 2016-08-02 MED ORDER — PROPOFOL 10 MG/ML IV BOLUS
INTRAVENOUS | Status: DC | PRN
  Administered 2016-08-02: 20 mg via INTRAVENOUS

## 2016-08-02 MED ORDER — ONDANSETRON HCL 4 MG/2ML IJ SOLN
INTRAMUSCULAR | Status: AC
  Filled 2016-08-02: qty 2

## 2016-08-02 MED ORDER — OXYCODONE HCL 5 MG PO TABS
5.0000 mg | ORAL_TABLET | Freq: Once | ORAL | Status: AC | PRN
  Administered 2016-08-02: 5 mg via ORAL

## 2016-08-02 MED ORDER — PROPOFOL 10 MG/ML IV BOLUS
INTRAVENOUS | Status: AC
  Filled 2016-08-02: qty 40

## 2016-08-02 MED ORDER — OXYCODONE HCL 5 MG/5ML PO SOLN: 5.0000 mg | Freq: Once | ORAL | Status: AC | PRN

## 2016-08-02 MED ORDER — DEXTROSE 5 % IV SOLN
3.0000 g | INTRAVENOUS | Status: AC
  Administered 2016-08-02: 3 g via INTRAVENOUS
  Filled 2016-08-02: qty 3000

## 2016-08-02 MED ORDER — MIDAZOLAM HCL 2 MG/2ML IJ SOLN
INTRAMUSCULAR | Status: AC
  Filled 2016-08-02: qty 2

## 2016-08-02 MED ORDER — CHLORHEXIDINE GLUCONATE CLOTH 2 % EX PADS: 6.0000 | MEDICATED_PAD | Freq: Once | CUTANEOUS | Status: DC

## 2016-08-02 MED ORDER — BACITRACIN ZINC 500 UNIT/GM EX OINT
TOPICAL_OINTMENT | CUTANEOUS | Status: AC
  Filled 2016-08-02: qty 28.35

## 2016-08-02 MED ORDER — LACTATED RINGERS IV SOLN
INTRAVENOUS | Status: DC
  Administered 2016-08-02: 10:00:00 via INTRAVENOUS

## 2016-08-02 MED ORDER — SUGAMMADEX SODIUM 500 MG/5ML IV SOLN
INTRAVENOUS | Status: AC
  Filled 2016-08-02: qty 5

## 2016-08-02 SURGICAL SUPPLY — 70 items
ADH SKN CLS APL DERMABOND .7 (GAUZE/BANDAGES/DRESSINGS)
BANDAGE ACE 3X5.8 VEL STRL LF (GAUZE/BANDAGES/DRESSINGS) ×3 IMPLANT
BLADE SURG 15 STRL LF DISP TIS (BLADE) ×1 IMPLANT
BLADE SURG 15 STRL SS (BLADE) ×3
BNDG GAUZE ELAST 4 BULKY (GAUZE/BANDAGES/DRESSINGS) ×2 IMPLANT
CARTRIDGE OIL MAESTRO DRILL (MISCELLANEOUS) ×1 IMPLANT
CORD BIPOLAR FORCEPS 12FT (ELECTRODE) ×3 IMPLANT
DECANTER SPIKE VIAL GLASS SM (MISCELLANEOUS) ×3 IMPLANT
DERMABOND ADVANCED (GAUZE/BANDAGES/DRESSINGS)
DERMABOND ADVANCED .7 DNX12 (GAUZE/BANDAGES/DRESSINGS) IMPLANT
DIFFUSER DRILL AIR PNEUMATIC (MISCELLANEOUS) ×1 IMPLANT
DRAPE EXTREMITY T 121X128X90 (DRAPE) ×3 IMPLANT
DRAPE HALF SHEET 40X57 (DRAPES) ×1 IMPLANT
DRSG TELFA 3X8 NADH (GAUZE/BANDAGES/DRESSINGS) ×3 IMPLANT
DURAPREP 26ML APPLICATOR (WOUND CARE) ×3 IMPLANT
GAUZE SPONGE 4X4 12PLY STRL (GAUZE/BANDAGES/DRESSINGS) ×1 IMPLANT
GAUZE SPONGE 4X4 16PLY XRAY LF (GAUZE/BANDAGES/DRESSINGS) ×3 IMPLANT
GLOVE BIO SURGEON STRL SZ 6.5 (GLOVE) IMPLANT
GLOVE BIO SURGEON STRL SZ7 (GLOVE) IMPLANT
GLOVE BIO SURGEON STRL SZ7.5 (GLOVE) IMPLANT
GLOVE BIO SURGEON STRL SZ8 (GLOVE) IMPLANT
GLOVE BIO SURGEON STRL SZ8.5 (GLOVE) IMPLANT
GLOVE BIO SURGEONS STRL SZ 6.5 (GLOVE)
GLOVE BIOGEL M 8.0 STRL (GLOVE) IMPLANT
GLOVE BIOGEL PI IND STRL 7.0 (GLOVE) IMPLANT
GLOVE BIOGEL PI IND STRL 7.5 (GLOVE) IMPLANT
GLOVE BIOGEL PI INDICATOR 7.0 (GLOVE) ×4
GLOVE BIOGEL PI INDICATOR 7.5 (GLOVE) ×4
GLOVE ECLIPSE 6.5 STRL STRAW (GLOVE) ×3 IMPLANT
GLOVE ECLIPSE 7.0 STRL STRAW (GLOVE) IMPLANT
GLOVE ECLIPSE 7.5 STRL STRAW (GLOVE) IMPLANT
GLOVE ECLIPSE 8.0 STRL XLNG CF (GLOVE) IMPLANT
GLOVE ECLIPSE 8.5 STRL (GLOVE) IMPLANT
GLOVE EXAM NITRILE LRG STRL (GLOVE) IMPLANT
GLOVE EXAM NITRILE XL STR (GLOVE) IMPLANT
GLOVE EXAM NITRILE XS STR PU (GLOVE) IMPLANT
GLOVE INDICATOR 6.5 STRL GRN (GLOVE) IMPLANT
GLOVE INDICATOR 7.0 STRL GRN (GLOVE) IMPLANT
GLOVE INDICATOR 7.5 STRL GRN (GLOVE) IMPLANT
GLOVE INDICATOR 8.0 STRL GRN (GLOVE) IMPLANT
GLOVE INDICATOR 8.5 STRL (GLOVE) IMPLANT
GLOVE OPTIFIT SS 8.0 STRL (GLOVE) IMPLANT
GLOVE SS BIOGEL STRL SZ 7 (GLOVE) IMPLANT
GLOVE SUPERSENSE BIOGEL SZ 7 (GLOVE) ×2
GLOVE SURG SS PI 6.5 STRL IVOR (GLOVE) IMPLANT
GLOVE SURG SS PI 7.0 STRL IVOR (GLOVE) ×2 IMPLANT
GOWN STRL REUS W/ TWL LRG LVL3 (GOWN DISPOSABLE) ×2 IMPLANT
GOWN STRL REUS W/ TWL XL LVL3 (GOWN DISPOSABLE) IMPLANT
GOWN STRL REUS W/TWL 2XL LVL3 (GOWN DISPOSABLE) IMPLANT
GOWN STRL REUS W/TWL LRG LVL3 (GOWN DISPOSABLE) ×9
GOWN STRL REUS W/TWL XL LVL3 (GOWN DISPOSABLE)
KIT BASIN OR (CUSTOM PROCEDURE TRAY) ×3 IMPLANT
KIT ROOM TURNOVER OR (KITS) ×3 IMPLANT
NDL HYPO 25X1 1.5 SAFETY (NEEDLE) ×1 IMPLANT
NEEDLE HYPO 25X1 1.5 SAFETY (NEEDLE) ×3 IMPLANT
NS IRRIG 1000ML POUR BTL (IV SOLUTION) ×3 IMPLANT
OIL CARTRIDGE MAESTRO DRILL (MISCELLANEOUS)
PACK SURGICAL SETUP 50X90 (CUSTOM PROCEDURE TRAY) ×3 IMPLANT
PAD ARMBOARD 7.5X6 YLW CONV (MISCELLANEOUS) ×9 IMPLANT
PAD DRESSING TELFA 3X8 NADH (GAUZE/BANDAGES/DRESSINGS) IMPLANT
STOCKINETTE 4X48 STRL (DRAPES) ×3 IMPLANT
SUT ETHILON 3 0 PS 1 (SUTURE) ×3 IMPLANT
SYR BULB 3OZ (MISCELLANEOUS) ×3 IMPLANT
SYR CONTROL 10ML LL (SYRINGE) ×3 IMPLANT
TOWEL GREEN STERILE (TOWEL DISPOSABLE) ×3 IMPLANT
TOWEL GREEN STERILE FF (TOWEL DISPOSABLE) ×3 IMPLANT
TUBE CONNECTING 12'X1/4 (SUCTIONS) ×1
TUBE CONNECTING 12X1/4 (SUCTIONS) ×2 IMPLANT
UNDERPAD 30X30 (UNDERPADS AND DIAPERS) ×3 IMPLANT
WATER STERILE IRR 1000ML POUR (IV SOLUTION) ×3 IMPLANT

## 2016-08-02 NOTE — Op Note (Signed)
   12:45 PM  PATIENT:  Allison RakersSeamon M [name]  36 y.o. female with right carpal tunnel syndrome. She has opted for operative decompression of the right transverse carpal ligament  PRE-OPERATIVE DIAGNOSIS:  right Carpal Tunnel Syndrome  POST-OPERATIVE DIAGNOSIS:  right Carpal Tunnel Syndrome  PROCEDURE:  Procedure(s): CARPAL TUNNEL RELEASE RIGHT  SURGEON: Surgeon(s): Coletta Memosabbell, Ishaan Villamar, MD  ANESTHESIA:   local and IV sedation  EBL:  Total I/O In: 600 [I.V.:600] Out: 2 [Blood:2]  COUNT:per nursing  DICTATION: Allison [name] was taken to the operating room, given IV sedation, and positioned on the operating room table. female had their right upper extremity prepped and draped in a sterile manner. I infiltrated Licodcaine  1/2% 1/200,000 strength epinephrine into the planned incision starting at the proximal palmar crease extending into the hand ~ 1.5cm. I opened the skin with a 15 blade and extended the incision through the skin into the subcutaneous tissue. I used the bipolar cautery to control the subcutaneous bleeding. I dissected sharply through the tissue using forceps also to expose the transverse carpal ligament. I dived the transverse carpal ligament sharply with the 15 blade using the forceps to protect the contents of the carpal tunnel. With the scissors I divided the ligament both proximally and distally to decompress the entire carpal tunnel. I used the scissors to dissect into the forearm to create space to divide the ligament to the proximal palmar crease, and distally into the palm.  I irrigated the wound then closed the incision with vertical interrupted vertical mattress sutures. I placed a sterile dressing, then wrapped the proximal hand and distal forearm with an ace wrap.  PLAN OF CARE: Discharge to home after PACU  PATIENT DISPOSITION:  PACU - hemodynamically stable.   Delay start of Pharmacological VTE agent (>24hrs) due to surgical blood loss or risk of bleeding:  yes

## 2016-08-02 NOTE — Transfer of Care (Signed)
Immediate Anesthesia Transfer of Care Note  Patient: Allison [name]  Procedure(s) Performed: Procedure(s) with comments: CARPAL TUNNEL RELEASE RIGHT (Right) - right  Patient Location: PACU  Anesthesia Type:MAC  Level of Consciousness: awake, alert  and oriented  Airway & Oxygen Therapy: Patient Spontanous Breathing  Post-op Assessment: Report given to RN and Post -op Vital signs reviewed and stable  Post vital signs: Reviewed and stable  Last Vitals:  Vitals:   08/02/16 0912  BP: 118/84  Pulse: 99  Resp: 18  Temp: 36.7 C    Last Pain:  Vitals:   08/02/16 0930  TempSrc:   PainSc: 8       Patients Stated Pain Goal: 4 (73/40/37 0964)  Complications: No apparent anesthesia complications

## 2016-08-02 NOTE — Anesthesia Preprocedure Evaluation (Signed)
Anesthesia Evaluation  Patient identified by MRN, date of birth, ID band Patient awake    Reviewed: Allergy & Precautions, H&P , NPO status , Patient's Chart, lab work & pertinent test results  Airway Mallampati: II   Neck ROM: full    Dental   Pulmonary neg pulmonary ROS,    breath sounds clear to auscultation       Cardiovascular hypertension,  Rhythm:regular Rate:Normal     Neuro/Psych  Headaches,  Neuromuscular disease    GI/Hepatic GERD  ,  Endo/Other  Morbid obesity  Renal/GU      Musculoskeletal  (+) Arthritis ,   Abdominal   Peds  Hematology   Anesthesia Other Findings   Reproductive/Obstetrics                             Anesthesia Physical Anesthesia Plan  ASA: II  Anesthesia Plan: MAC   Post-op Pain Management:    Induction: Intravenous  PONV Risk Score and Plan: 2 and Ondansetron, Dexamethasone and Treatment may vary due to age or medical condition  Airway Management Planned: Simple Face Mask  Additional Equipment:   Intra-op Plan:   Post-operative Plan:   Informed Consent: I have reviewed the patients History and Physical, chart, labs and discussed the procedure including the risks, benefits and alternatives for the proposed anesthesia with the patient or authorized representative who has indicated his/her understanding and acceptance.     Plan Discussed with: CRNA, Anesthesiologist and Surgeon  Anesthesia Plan Comments:         Anesthesia Quick Evaluation

## 2016-08-02 NOTE — Discharge Instructions (Addendum)

## 2016-08-02 NOTE — H&P (Signed)
BP 118/84   Pulse 99   Temp 98 F (36.7 C) (Oral)   Resp 18   Ht 5\' 5"  (1.651 m)   Wt 298 lb 8 oz (135.4 kg)   LMP 07/28/2016   SpO2 100%   BMI 49.67 kg/m  Allison [name] comes in today complaining of pain in both hands secondary to carpal tunnel syndrome. She has had EMG-proven carpal tunnel syndrome. She has suffered from this for approximately three years. She also was having severe pain in the left neck, left shoulder, and left upper extremity. She is weak in both hands and weak throughout the left upper extremity. She cannot lift her arms to comb or brush her hair on the left side. Severe pain in the shoulder when she does try to use it. She has had no bowel or bladder dysfunction.  SOCIAL HISTORY: She is 36 years of age, is right-handed, does not use alcohol, does not use illicit drugs, and does not smoke.  PREVIOUS SURGERY: She has undergone a cesarean section and tubal ligation and has had a cholecystectomy. PAST MEDICAL HISTORY: Significant for hypertension, gastrointestinal problems, and congestive heart failure diagnosed in 2011. ALLERGIES: SHE HAS AN INTOLERANCE TO REGLAN. IT CAUSES SHAKES, NERVOUSNESS, AND PARANOIA.  CURRENT MEDICATIONS: She takes Gabapentin, Nortriptyline, Trazodone, Cyclobenzaprine, Ambien, Imitrex, Topamax, Elavil, and she is also in a steroid taper which will end tomorrow.  FAMILY HISTORY: Mother, [id number], is in fair health. Has hypertension, COPD, diabetes, and a knee replacement. Father, [id number], is in good health. Diabetes, hypertension, arthritis, and leukemia in the family history.  REVIEW OF SYSTEMS: In her words, she has carpal tunnel and major pain in the shoulder of the left arm. Two months for the upper extremity and shoulder pain; three years for the carpal tunnel. She struggles to move. She struggles to sleep and even drive. Numbness and tingling in her fingers. Weakness in the upper extremities and headaches. She, on Review of Systems, reports leg pain with  walking, arthritis, neck pain, arm weakness, leg weakness, back pain, arm pain and leg pain, joint pain, and problems with coordination of arms and legs.  PHYSICAL EXAMINATION: Vital Signs: Height 5 feet 4.5 inches. Weight 295 pounds. Pain is 9/10. Blood pressure 119/81. Pulse 79. Temperature is 97.3.  NEUROLOGICAL EXAMINATION: On exam, she is alert, oriented x4, answering all questions appropriately. Weak grip bilaterally at 4/5. She has weak triceps 4/5, weak biceps 4/5, and shoulder abductor is about a 3/5 on the left. Those muscle groups are normal on the right. Again, the only weakness noted is in the grip and intrinsics on the right side. Normal strength in the lower extremities. Reflexes 2+ in the biceps, triceps, brachioradialis, knees, and ankles. She has a normal gait. Memory, language, attention span, and fund of knowledge are normal. Speech is clear and fluent. Muscle tone, bulk, and coordination is normal. Romberg is negative. She has no clonus of Hoffmann's sign. Toes are downgoing to plantar stimulation.  DIAGNOSTIC STUDIES: She had an EMG which supports the diagnosis of carpal tunnel syndrome Allison [name] had proven carpal tunnel on the right and left hands. She at this point says that the pain in the right hand has been getting worse. She would like to proceed with a carpal tunnel release.

## 2016-08-03 ENCOUNTER — Encounter (HOSPITAL_COMMUNITY): Payer: Self-pay | Admitting: Neurosurgery

## 2016-08-03 NOTE — Anesthesia Postprocedure Evaluation (Signed)
Anesthesia Post Note  Patient: Allison [name]  Procedure(s) Performed: Procedure(s) (LRB): CARPAL TUNNEL RELEASE RIGHT (Right)     Patient location during evaluation: PACU Anesthesia Type: MAC Level of consciousness: awake and alert Pain management: pain level controlled Vital Signs Assessment: post-procedure vital signs reviewed and stable Respiratory status: spontaneous breathing, nonlabored ventilation, respiratory function stable and patient connected to nasal cannula oxygen Cardiovascular status: stable and blood pressure returned to baseline Anesthetic complications: no    Last Vitals:  Vitals:   08/02/16 1329 08/02/16 1330  BP:  117/78  Pulse: 94 92  Resp: 19 17  Temp:  36.4 C    Last Pain:  Vitals:   08/02/16 1320  TempSrc:   PainSc: [place]

## 2016-08-08 ENCOUNTER — Ambulatory Visit: Payer: Medicaid Other | Admitting: Adult Health

## 2016-08-15 ENCOUNTER — Ambulatory Visit: Payer: Medicaid Other | Admitting: Pulmonary Disease

## 2016-09-24 ENCOUNTER — Ambulatory Visit: Payer: Medicaid Other | Admitting: Occupational Therapy

## 2016-09-25 ENCOUNTER — Ambulatory Visit: Payer: Medicaid Other | Admitting: Occupational Therapy

## 2016-09-26 ENCOUNTER — Encounter: Payer: Medicaid Other | Admitting: Occupational Therapy

## 2016-09-27 ENCOUNTER — Ambulatory Visit: Payer: Medicaid Other | Attending: Neurosurgery | Admitting: Occupational Therapy

## 2016-09-27 DIAGNOSIS — R202 Paresthesia of skin: Secondary | ICD-10-CM

## 2016-09-27 DIAGNOSIS — R278 Other lack of coordination: Secondary | ICD-10-CM | POA: Diagnosis present

## 2016-09-27 DIAGNOSIS — M6281 Muscle weakness (generalized): Secondary | ICD-10-CM | POA: Diagnosis present

## 2016-09-27 DIAGNOSIS — M79641 Pain in right hand: Secondary | ICD-10-CM | POA: Insufficient documentation

## 2016-09-27 NOTE — Therapy (Signed)
[facility] [address] [place], [place], [address] Phone: [phone]   Fax:  [phone]  Occupational Therapy Evaluation  Patient Details  Name: Allison [name] MRN: [id number] Date of Birth: 25-May-1980 Referring Provider: Dr. Coletta Memos  Encounter Date: 09/27/2016      OT End of Session - 09/27/16 1151    Visit Number 1   Number of Visits 3   Authorization Type MCD - awaiting authorization   OT Start Time 1100   OT Stop Time 1140   OT Time Calculation (min) 40 min   Activity Tolerance Patient tolerated treatment well      Past Medical History:  Diagnosis Date  . Arthritis   . 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 Carpal tunnel syndrome of right wrist   . Chronic back pain   . Gallstones   . GERD (gastroesophageal reflux disease)    takes Omeprazole daily  . History of migraine   . Migraines   . Morbid obesity (HCC)   . Nausea    takes Phenergan as needed  . Osteoarthritis    takes Relafen daily  . PIH (pregnancy induced hypertension)    takes Coreg,Enalapril,and Amlodipine daily    Past Surgical History:  Procedure Laterality Date  . CARPAL TUNNEL RELEASE Right 08/02/2016   Procedure: CARPAL TUNNEL RELEASE RIGHT;  Surgeon: Coletta Memos, MD;  Location: [facility] OR;  Service: Neurosurgery;  Laterality: Right;  right  . CESAREAN SECTION  2011  . CHOLECYSTECTOMY N/A 01/23/2013   Procedure: LAPAROSCOPIC CHOLECYSTECTOMY;  Surgeon: Atilano Ina, MD;  Location[facility] OR;  Service: General;  Laterality: N/A;  . TUBAL LIGATION  2011    There were no vitals filed for this visit.      Subjective Assessment - 09/27/16 1105    Subjective  I'm Rt handed   Pertinent History Rt CTS > Lt CTS s/p Rt CTR 08/02/16, OA of lumbar spine, Lt adhesive capsilitis, CHF   Patient Stated Goals to increase function and strength in Rt hand   Currently in Pain? Yes   Pain Score 7    Pain Location Hand   Pain  Orientation Right   Pain Descriptors / Indicators Sharp;Shooting;Tingling   Pain Type Chronic pain   Pain Onset More than a month ago   Pain Frequency Intermittent   Aggravating Factors  pressure   Pain Relieving Factors brace           [facility] OT Assessment - 09/27/16 0001      Assessment   Diagnosis CTS of Rt wrist  s/p Rt CTR 08/02/16   Referring Provider Dr. Coletta Memos   Onset Date --  approx. 1 year ago   Prior Therapy none     Precautions   Precautions None     Restrictions   Weight Bearing Restrictions No     Balance Screen   Has the patient fallen in the past 6 months No   Has the patient had a decrease in activity level because of a fear of falling?  No   Is the patient reluctant to leave their home because of a fear of falling?  No     Home  Environment   Lives With --  with children (15, 63, and 32 y.o.)     Prior Function   Level of Independence Independent   Vocation Unemployed   Leisure baking     ADL   Eating/Feeding Modified independent  hold utensil differently, but still with  Rt hand   Grooming --  assist with hair   Upper Body Bathing Modified independent   Lower Body Bathing Modified independent   Upper Body Dressing Minimal assistance  assist to hook bra   Lower Body Dressing Modified independent   Toilet Transfer Independent   Toileting -  Hygiene Independent   Tub/Shower Transfer Independent   ADL comments c/o dropping items alot, unable to open tight jars, cut vegetables, stir food      IADL   Shopping Needs to be accompanied on any shopping trip  assist with carrying heavier groceries   Light Housekeeping Performs light daily tasks such as dishwashing, bed making   Meal Prep Plans, prepares and serves adequate meals independently   needs assist to drain/ lift heavy pots, open jars, cutting   Medication Management Is responsible for taking medication in correct dosages at correct time   Financial Management Manages financial  matters independently (budgets, writes checks, pays rent, bills goes to bank), collects and keeps track of income     Mobility   Mobility Status Independent     Written Expression   Dominant Hand Right   Handwriting 100% legible  but increased difficulty and extra time     Vision - History   Baseline Vision No visual deficits     Observation/Other Assessments   Observations scar flat and healing nicely with min scar tissue     Sensation   Additional Comments ring finger distal tip constantly numb     Coordination   9 Hole Peg Test Right;Left   Right 9 Hole Peg Test 30.03 sec. with 1 drop   Left 9 Hole Peg Test 22.53 sec     Edema   Edema mild at incision only     ROM / Strength   AROM / PROM / Strength AROM     AROM   Overall AROM Comments RUE AROM WNL's at shoulder, elbow, forearm and wrist. Pt has full composite flexion and extension Rt hand, weak with palmer adduction.  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      Hand Function   Right Hand Grip (lbs) 25   Right Hand Lateral Pinch 7 lbs   Right Hand 3 Point Pinch 5 lbs   Left Hand Grip (lbs) 99   Left Hand Lateral Pinch 16.5 lbs   Left 3 point pinch 17 lbs                         OT Education - 09/27/16 1145    Education provided Yes   Education Details scar massage   Person(s) Educated Patient   Methods Explanation   Comprehension Verbalized understanding             OT Long Term Goals - 09/27/16 1217      OT LONG TERM GOAL #1   Title Independent with HEP    Baseline dependent   Time 4   Period Weeks   Status New     OT LONG TERM GOAL #2   Title Pt to report greater ease with writing, cutting and stirring food, and opening jars with A/E prn   Baseline max difficulty and/or unable   Time 4   Period Weeks   Status New     OT LONG TERM GOAL #3   Title Improve grip strength Rt hand by 10 lbs or greater to open containers/jars   Baseline  eval = 25 lbs (  Lt = 99 lbs)    Time 4   Period Weeks   Status New     OT LONG TERM GOAL #4   Title Pt to improve coordination as evidenced by reducing speed on 9 hole peg test to 25 sec. or less Rt hand   Baseline eval = 30.03 sec (Lt = 22.53 sec)    Time 4   Period Weeks   Status New     OT LONG TERM GOAL #5   Title Pt to report greater ease with writing and less pain overall   Baseline max difficulty writing with pain up to 7/10   Time 4   Period Weeks   Status New               Plan - 09/27/16 1201    Clinical Impression Statement Pt is a 36 y.o. female who presents to outpatient rehab s/p Rt CTR (16109) on  08/02/16. Pt presents today with increased pain, decreased coordination, pinch and grip strength which limits her ability to perform ADLS, IADLS and childcare tasks   Occupational Profile and client history currently impacting functional performance PMH: CTR Rt and Lt hands, OA, CHF, Lt shoulder adhesive capsulitis which also limits function with daily activities   Occupational performance deficits (Please refer to evaluation for details): ADL's;IADL's;Leisure;Other  childcare tasks   Rehab Potential Fair   Current Impairments/barriers affecting progress: limited MCD visits, co-morbidities   OT Frequency 1x / week   OT Duration --  3 weeks   OT Treatment/Interventions Self-care/ADL training;Moist Heat;Fluidtherapy;DME and/or AE instruction;Splinting;Patient/family education;Compression bandaging;Therapeutic exercises;Therapeutic activities;Scar mobilization;Passive range of motion;Cryotherapy;Parrafin;Manual Therapy   Plan Issue tendon gliding, n. gliding and putty HEP, A/E options for writing, opening jars, ergonomic knives for cutting, etc.   Clinical Decision Making Limited treatment options, no task modification necessary   Consulted and Agree with Plan of Care Patient      Patient will benefit from skilled therapeutic intervention in order to improve the  following deficits and impairments:  Decreased coordination, Impaired sensation, Increased edema, Impaired UE functional use, Pain, Decreased strength, Decreased scar mobility  Visit Diagnosis: Pain in right hand - Plan: Ot plan of care cert/re-cert  Muscle weakness (generalized) - Plan: Ot plan of care cert/re-cert  Other lack of coordination - Plan: Ot plan of care cert/re-cert  Paresthesia of skin - Plan: Ot plan of care cert/re-cert    Problem List Patient Active Problem List   Diagnosis Date Noted  . Vaginal itching 04/04/2016  . Dysuria 04/04/2016  . Mild dysplasia of cervix 11/24/2012  . Papanicolaou smear of cervix with low grade squamous intraepithelial lesion (LGSIL) 10/21/2012  . HTN (hypertension) 12/10/2011  . Fatigue 03/04/2010  . OBESITY, MORBID 02/10/2010  . ESSENTIAL HYPERTENSION, BENIGN 07/20/2009  . Secondary cardiomyopathy, unspecified 07/20/2009  . EDEMA 07/20/2009    Kelli Churn, OTR/L 09/27/2016, 12:59 PM  [facility] [address] [place], [place], [address] Phone: [phone]   Fax:  [phone]  Name: KAETLYN NOA MRN: [id number] Date of Birth: 02-Jun-1980

## 2016-10-01 ENCOUNTER — Other Ambulatory Visit (HOSPITAL_COMMUNITY)
Admission: RE | Admit: 2016-10-01 | Discharge: 2016-10-01 | Disposition: A | Discharge status: Home / Self Care | Payer: Medicaid Other | Source: Ambulatory Visit | Attending: Obstetrics | Admitting: Obstetrics

## 2016-10-01 ENCOUNTER — Ambulatory Visit (INDEPENDENT_AMBULATORY_CARE_PROVIDER_SITE_OTHER): Payer: Medicaid Other | Admitting: Obstetrics

## 2016-10-01 ENCOUNTER — Encounter: Payer: Self-pay | Admitting: Obstetrics

## 2016-10-01 VITALS — BP 121/86 | HR 101 | Ht 65.0 in | Wt 299.0 lb

## 2016-10-01 DIAGNOSIS — Z01419 Encounter for gynecological examination (general) (routine) without abnormal findings: Secondary | ICD-10-CM

## 2016-10-01 DIAGNOSIS — N898 Other specified noninflammatory disorders of vagina: Secondary | ICD-10-CM

## 2016-10-01 DIAGNOSIS — Z0000 Encounter for general adult medical examination without abnormal findings: Secondary | ICD-10-CM | POA: Diagnosis not present

## 2016-10-01 DIAGNOSIS — Z113 Encounter for screening for infections with a predominantly sexual mode of transmission: Secondary | ICD-10-CM

## 2016-10-01 NOTE — Progress Notes (Signed)
Last Pap 09/28/15 Normal

## 2016-10-01 NOTE — Progress Notes (Signed)
Subjective:        Allison [name] is a 36 y.o. female here for a routine exam.  Current complaints: None.    Personal health questionnaire:  Is patient [name], have a family history of breast and/or ovarian cancer: no Is there a family history of uterine cancer diagnosed at age < 14, gastrointestinal cancer, urinary tract cancer, family member who is a Personnel officer syndrome-associated carrier: no Is the patient overweight and hypertensive, family history of diabetes, personal history of gestational diabetes, preeclampsia or PCOS: no Is patient over 46, have PCOS,  family history of premature CHD under age 54, diabetes, smoke, have hypertension or peripheral artery disease:  no At any time, has a partner hit, kicked or otherwise hurt or frightened you?: no Over the past 2 weeks, have you felt down, depressed or hopeless?: no Over the past 2 weeks, have you felt little interest or pleasure in doing things?:no   Gynecologic History Patient's last menstrual period was 09/20/2016. Contraception: tubal ligation Last Pap: 2017. Results were: normal Last mammogram: n/a. Results were: n/a  Obstetric History OB History  Gravida Para Term Preterm AB Living  SAB TAB Ectopic Multiple Live Births          3    # Outcome Date GA Lbr Len/2nd Weight Sex Delivery Anes PTL Lv  3 Term 06/28/09 [redacted]w[redacted]d  5 lb (2.268 kg) M CS-LTranv EPI N LIV     Birth Comments: PIH, Congestive Heart Failure  2 Term 04/16/05 [redacted]w[redacted]d  7 lb 8 oz (3.402 kg) F Vag-Spont EPI N LIV  1 Term 02/06/01 [redacted]w[redacted]d  5 lb 7 oz (2.466 kg) F Vag-Spont EPI N LIV      Past Medical History:  Diagnosis Date  . Arthritis   . 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 Carpal tunnel syndrome of right wrist   . Chronic back pain   . Gallstones   . GERD (gastroesophageal reflux disease)    takes Omeprazole daily  . History of migraine   . Migraines   . Morbid obesity (HCC)   . Nausea    takes Phenergan as  needed  . Osteoarthritis    takes Relafen daily  . PIH (pregnancy induced hypertension)    takes Coreg,Enalapril,and Amlodipine daily    Past Surgical History:  Procedure Laterality Date  . CARPAL TUNNEL RELEASE Right 08/02/2016   Procedure: CARPAL TUNNEL RELEASE RIGHT;  Surgeon: Coletta Memos, MD;  Location: [facility] OR;  Service: Neurosurgery;  Laterality: Right;  right  . CESAREAN SECTION  2011  . CHOLECYSTECTOMY N/A 01/23/2013   Procedure: LAPAROSCOPIC CHOLECYSTECTOMY;  Surgeon: Atilano Ina, MD;  Location: [facility] OR;  Service: General;  Laterality: N/A;  . TUBAL LIGATION  2011     Current Outpatient Prescriptions:  .  amitriptyline (ELAVIL) 25 MG tablet, Take 25 mg by mouth at bedtime., Disp: , Rfl:  .  amLODipine (NORVASC) 5 MG tablet, Take 5 mg by mouth daily., Disp: , Rfl: 3 .  carvedilol (COREG) 12.5 MG tablet, Take 12.5 mg by mouth 2 (two) times daily with a meal.  , Disp: , Rfl:  .  CVS D3 1000 units capsule, Take 1,000 Units by mouth daily., Disp: , Rfl: 1 .  cyclobenzaprine (FLEXERIL) 5 MG tablet, Take 5 mg by mouth 3 (three) times daily. , Disp: , Rfl: 2 .  DENTA 5000 PLUS 1.1 % CREA dental cream, Take 1 application by mouth daily.,  Disp: , Rfl: 6 .  diclofenac (VOLTAREN) 75 MG EC tablet, Take 75 mg by mouth 2 (two) times daily., Disp: , Rfl: 2 .  enalapril (VASOTEC) 10 MG tablet, Take 10 mg by mouth 2 (two) times daily. , Disp: , Rfl:  .  furosemide (LASIX) 40 MG tablet, Take 40 mg by mouth daily with breakfast., Disp: , Rfl:  .  gabapentin (NEURONTIN) 300 MG capsule, Take 300 mg by mouth 2 (two) times daily. , Disp: , Rfl:  .  Melatonin 3 MG CAPS, Take 1 capsule (3 mg total) by mouth at bedtime., Disp: , Rfl: 0 .  omeprazole (PRILOSEC) 20 MG capsule, Take 20 mg by mouth daily before breakfast. , Disp: , Rfl:  .  oxyCODONE-acetaminophen (PERCOCET) 10-325 MG per tablet, Take 1 tablet by mouth every 6 (six) hours. , Disp: , Rfl: 0 .  senna-docusate (SENOKOT S) 8.6-50 MG tablet, Take 1  tablet by mouth 2 (two) times daily., Disp: , Rfl:  .  SUMAtriptan (IMITREX) 50 MG tablet, Take 50 mg by mouth daily as needed for migraine. May repeat in 2 hours if headache persists or recurs. , Disp: , Rfl:  .  topiramate (TOPAMAX) 100 MG tablet, Take 100 mg by mouth 2 (two) times daily., Disp: , Rfl:  Allergies  Allergen Reactions  . Cymbalta [Duloxetine Hcl] Other (See Comments)    Paranoia/nauseous/anxious feeling.  . Reglan [Metoclopramide] Anxiety    Social History  Substance Use Topics  . Smoking status: Never Smoker  . Smokeless tobacco: Never Used  . Alcohol use No    Family History  Problem Relation Age of Onset  . Hypertension Mother   . Diabetes Mother   . Heart disease Mother   . Miscarriages / [place] Mother   . COPD Mother   . Arthritis Mother   . Alcohol abuse Father   . Asthma Father   . Diabetes Maternal Grandmother   . Heart disease Maternal Grandmother   . Cancer Maternal Grandfather        leukemia  . Arthritis Maternal Grandfather   . Cancer Paternal Grandmother        breast  . Cancer Paternal Grandfather        leukemia      Review of Systems  Constitutional: negative for fatigue and weight loss Respiratory: negative for cough and wheezing Cardiovascular: negative for chest pain, fatigue and palpitations Gastrointestinal: negative for abdominal pain and change in bowel habits Musculoskeletal:negative for myalgias Neurological: negative for gait problems and tremors Behavioral/Psych: negative for abusive relationship, depression Endocrine: negative for temperature intolerance    Genitourinary:negative for abnormal menstrual periods, genital lesions, hot flashes, sexual problems and vaginal discharge Integument/breast: negative for breast lump, breast tenderness, nipple discharge and skin lesion(s)    Objective:       BP 121/86   Pulse (!) 101   Ht  (1.651 m)   Wt 299 lb (135.6 kg)   LMP 09/20/2016   BMI 49.76 kg/m   General:   alert  Skin:   no rash or abnormalities  Lungs:   clear to auscultation bilaterally  Heart:   regular rate and rhythm, S1, S2 normal, no murmur, click, rub or gallop  Breasts:   normal without suspicious masses, skin or nipple changes or axillary nodes  Abdomen:  normal findings: no organomegaly, soft, non-tender and no hernia  Pelvis:  External genitalia: normal general appearance Urinary system: urethral meatus normal and bladder without fullness, nontender Vaginal: normal without  tenderness, induration or masses Cervix: normal appearance Adnexa: normal bimanual exam Uterus: anteverted and non-tender, normal size   Lab Review Urine pregnancy test Labs reviewed yes Radiologic studies reviewed no  50% of 20 min visit spent on counseling and coordination of care.    Assessment:     1. Encounter for gynecological examination with Papanicolaou smear of cervix Rx: - Cytology - PAP  2. Vaginal discharge Rx: - Cervicovaginal ancillary only    Plan:    Education reviewed: calcium supplements, depression evaluation, low fat, low cholesterol diet, safe sex/STD prevention, self breast exams and weight bearing exercise. Follow up in: 1 year.   No orders of the defined types were placed in this encounter.  Orders Placed This Encounter  Procedures  . Hepatitis B surface antigen  . HIV antibody  . RPR

## 2016-10-02 ENCOUNTER — Encounter: Payer: Medicaid Other | Admitting: Occupational Therapy

## 2016-10-02 LAB — CERVICOVAGINAL ANCILLARY ONLY
BACTERIAL VAGINITIS: NEGATIVE
CANDIDA VAGINITIS: NEGATIVE
CHLAMYDIA, DNA PROBE: NEGATIVE
Neisseria Gonorrhea: NEGATIVE
TRICH (WINDOWPATH): NEGATIVE

## 2016-10-03 LAB — CYTOLOGY - PAP
DIAGNOSIS: NEGATIVE
Diagnosis: REACTIVE
HPV: NOT DETECTED

## 2016-10-06 NOTE — Progress Notes (Signed)
HPI The patient presents for followup of nonischemic cardiomyopathy with an ejection fraction as low as 25% followup echo in 2012 and echo in Dec 2014 demonstrated it was up to 60%.  Since I last saw her she has done well.  She has back and leg pain but is able to do some walking.  The patient denies any new symptoms such as chest discomfort, neck or arm discomfort. There has been no new shortness of breath, PND or orthopnea. There have been no reported palpitations, presyncope or syncope.  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    Allergies  Allergen Reactions  . Cymbalta [Duloxetine Hcl] Other (See Comments)    Paranoia/nauseous/anxious feeling.  . Reglan [Metoclopramide] Anxiety    Current Outpatient Prescriptions  Medication Sig Dispense Refill  . amitriptyline (ELAVIL) 25 MG tablet Take 25 mg by mouth at bedtime.    Marland Kitchen amLODipine (NORVASC) 5 MG tablet Take 5 mg by mouth daily.  3  . carvedilol (COREG) 25 MG tablet Take 1 tablet (25 mg total) by mouth 2 (two) times daily with a meal. 180 tablet 3  . CVS D3 1000 units capsule Take 1,000 Units by mouth daily.  1  . cyclobenzaprine (FLEXERIL) 5 MG tablet Take 5 mg by mouth 3 (three) times daily.   2  . DENTA 5000 PLUS 1.1 % CREA dental cream Take 1 application by mouth daily.  6  . diclofenac (VOLTAREN) 75 MG EC tablet Take 75 mg by mouth 2 (two) times daily.  2  . enalapril (VASOTEC) 10 MG tablet Take 10 mg by mouth 2 (two) times daily.     . furosemide (LASIX) 40 MG tablet Take 40 mg by mouth daily with breakfast.    . gabapentin (NEURONTIN) 300 MG capsule Take 300 mg by mouth 2 (two) times daily.     . Melatonin 3 MG CAPS Take 1 capsule (3 mg total) by mouth at bedtime.  0  . omeprazole (PRILOSEC) 20 MG capsule Take 20 mg by mouth daily before breakfast.     . oxyCODONE-acetaminophen (PERCOCET) 10-325 MG per tablet Take 1 tablet by mouth every 6 (six) hours.   0  . senna-docusate (SENOKOT S) 8.6-50 MG tablet  Take 1 tablet by mouth 2 (two) times daily.    . SUMAtriptan (IMITREX) 50 MG tablet Take 50 mg by mouth daily as needed for migraine. May repeat in 2 hours if headache persists or recurs.     . topiramate (TOPAMAX) 100 MG tablet Take 100 mg by mouth 2 (two) times daily.     No current facility-administered medications for this visit.     Past Medical History:  Diagnosis Date  . Arthritis   . 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 Carpal tunnel syndrome of right wrist   . Chronic back pain   . Gallstones   . GERD (gastroesophageal reflux disease)    takes Omeprazole daily  . History of migraine   . Migraines   . Morbid obesity (HCC)   . Nausea    takes Phenergan as needed  . Osteoarthritis    takes Relafen daily  . PIH (pregnancy induced hypertension)    takes Coreg,Enalapril,and Amlodipine daily    Past Surgical History:  Procedure Laterality Date  . CARPAL TUNNEL RELEASE Right 08/02/2016   Procedure: CARPAL TUNNEL RELEASE RIGHT;  Surgeon: Coletta Memos, MD;  Location: [facility] OR;  Service: Neurosurgery;  Laterality: Right;  right  . CESAREAN  SECTION  2011  . CHOLECYSTECTOMY N/A 01/23/2013   Procedure: LAPAROSCOPIC CHOLECYSTECTOMY;  Surgeon: Atilano Ina, MD;  Location: [facility] OR;  Service: General;  Laterality: N/A;  . TUBAL LIGATION  2011    ROS:   As stated in the HPI and negative for all other systems.  PHYSICAL EXAM  BP 134/86   Pulse 96   Ht  (1.651 m)   Wt 296 lb 12.8 oz (134.6 kg)   LMP 09/20/2016   BMI 49.39 kg/m   GENERAL:  Well appearing NECK:  No jugular venous distention, waveform within normal limits, carotid upstroke brisk and symmetric, no bruits, no thyromegaly LUNGS:  Clear to auscultation bilaterally CHEST:  Unremarkable HEART:  PMI not displaced or sustained,S1 and S2 within normal limits, no S3, no S4, no clicks, no rubs, no murmurs ABD:  Flat, positive bowel sounds normal in frequency in pitch, no bruits, no rebound, no guarding, no  midline pulsatile mass, no hepatomegaly, no splenomegaly EXT:  2 plus pulses throughout, no edema, no cyanosis no clubbing   EKG:  Sinus rhythm, rate 96, axis within normal limits, intervals within normal limits, no acute ST-T wave changes.  Poor anterior R wave progression.   10/10/2016  ASSESSMENT AND PLAN  CARDIOMYOPATHY:   Her EF was improved previously.  She has had no clinical change so there is no indication for repeat echo.  I will make a med change as below.  ESSENTIAL HYPERTENSION:  Her BP is borderline and HR increased.  I will increase to Coreg 25 mg bid.    OBESITY, MORBID   I had previously given her a recommendation to be considered for bariatric surgery and she was going to look into this.   PREOP:  She is preop for a shoulder surgery.  She has no high risk findings and this is not a high risk procedure.  She has a high functional level.  According to ACC/AHA guidelines she is at acceptable risk for surgery.

## 2016-10-08 ENCOUNTER — Telehealth: Payer: Self-pay | Admitting: *Deleted

## 2016-10-08 ENCOUNTER — Encounter: Payer: Self-pay | Admitting: Cardiology

## 2016-10-08 ENCOUNTER — Ambulatory Visit (INDEPENDENT_AMBULATORY_CARE_PROVIDER_SITE_OTHER): Payer: Medicaid Other | Admitting: Cardiology

## 2016-10-08 VITALS — BP 134/86 | HR 96 | Ht 65.0 in | Wt 296.8 lb

## 2016-10-08 DIAGNOSIS — I10 Essential (primary) hypertension: Secondary | ICD-10-CM | POA: Diagnosis not present

## 2016-10-08 DIAGNOSIS — Z01810 Encounter for preprocedural cardiovascular examination: Secondary | ICD-10-CM

## 2016-10-08 DIAGNOSIS — I420 Dilated cardiomyopathy: Secondary | ICD-10-CM

## 2016-10-08 MED ORDER — CARVEDILOL 25 MG PO TABS
25.0000 mg | ORAL_TABLET | Freq: Two times a day (BID) | ORAL | 3 refills | Status: DC
Start: 2016-10-08 — End: 2017-12-04

## 2016-10-08 NOTE — Telephone Encounter (Signed)
Clearance for surgery faxed to [facility], pt had appt with Dr Antoine Poche today.

## 2016-10-08 NOTE — Patient Instructions (Signed)
Medication Instructions:  INCREASE- Carvedilol 25 mg twice a day  If you need a refill on your cardiac medications before your next appointment, please call your pharmacy.  Labwork: None Ordered   Testing/Procedures: None Ordered  Follow-Up: Your physician wants you to follow-up in: 1 Year. You should receive a reminder letter in the mail two months in advance. If you do not receive a letter, please call our office [phone].    Thank you for choosing [facility] at [facility]!!

## 2016-10-09 ENCOUNTER — Ambulatory Visit: Payer: Medicaid Other | Attending: Neurosurgery | Admitting: Occupational Therapy

## 2016-10-09 DIAGNOSIS — M79641 Pain in right hand: Secondary | ICD-10-CM

## 2016-10-09 DIAGNOSIS — M6281 Muscle weakness (generalized): Secondary | ICD-10-CM | POA: Insufficient documentation

## 2016-10-09 NOTE — Patient Instructions (Signed)
  SCAR MASSAGE:   Deep circles along incision with cocoa butter or Vitamin E cream, Perform 5 mintues, 2x/day    PUTTY EXERCISES:   1. Grip Strengthening (Resistive Putty)   Squeeze putty using thumb and all fingers. Repeat _15___ times. Do __2__ sessions per day.   2. Roll putty into tube on table and pinch between each finger and thumb x 10 reps each. (Do ring and small finger together)  3. Make pancake out of putty on table, then push putty out by spreading fingers x 10 reps each

## 2016-10-09 NOTE — Therapy (Signed)
[facility] [address] [place], [place], [address] Phone: [phone]   Fax:  [phone]  Occupational Therapy Treatment  Patient Details  Name: Allison [name] MRN: [id number] Date of Birth: 1980-01-21 Referring Provider: Dr. Ashok Pall  Encounter Date: 10/09/2016      OT End of Session - 10/09/16 1257    Visit Number 2   Authorization Type MCD - approved 3   Authorization Time Period 10/02/16 - 10/31/16   Authorization - Visit Number 1   Authorization - Number of Visits 3   OT Start Time 1200   OT Stop Time 0962   OT Time Calculation (min) 43 min   Activity Tolerance Patient tolerated treatment well      Past Medical History:  Diagnosis Date  . Arthritis   . 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 Carpal tunnel syndrome of right wrist   . Chronic back pain   . Gallstones   . GERD (gastroesophageal reflux disease)    takes Omeprazole daily  . History of migraine   . Migraines   . Morbid obesity ([place])   . Nausea    takes Phenergan as needed  . Osteoarthritis    takes Relafen daily  . PIH (pregnancy induced hypertension)    takes Coreg,Enalapril,and Amlodipine daily    Past Surgical History:  Procedure Laterality Date  . CARPAL TUNNEL RELEASE Right 08/02/2016   Procedure: CARPAL TUNNEL RELEASE RIGHT;  Surgeon: Ashok Pall, MD;  Location: [facility];  Service: Neurosurgery;  Laterality: Right;  right  . CESAREAN SECTION  2011  . CHOLECYSTECTOMY N/A 01/23/2013   Procedure: LAPAROSCOPIC CHOLECYSTECTOMY;  Surgeon: Gayland Curry, MD;  Location: [place];  Service: General;  Laterality: N/A;  . TUBAL LIGATION  2011    There were no vitals filed for this visit.      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 Yes   Pain Score 6    Pain Location Hand   Pain Orientation Right    Pain Descriptors / Indicators Sharp;Shooting;Tingling   Pain Type Chronic pain   Pain Onset More than a month ago   Pain Frequency Constant   Aggravating Factors  nothing   Pain Relieving Factors brace                      OT Treatments/Exercises (OP) - 10/09/16 0001      ADLs   ADL Comments Discussed A/E options and/or task modifications for tasks that cause increased pain or difficulty including: writing, cutting food, stirring, opening jars/containers. Pt shown ergonomic knives and Rt angled knives, various jar openers, options for writing and issued tan foam for pen. Also recommended easy glide gel pens. Also recommended chopper vs. cutting food.      Exercises   Exercises Hand     Hand Exercises   Other Hand Exercises Pt issued median n. gliding ex's, tendon gliding ex's, and putty HEP. Pt issued red putty for home use (see pt instructions for details on putty HEP). Pt performed all ex's as indicated (5-15 reps depending on ex)                 OT Education - 10/09/16 1219    Education provided Yes   Education Details median n. gliding, tendon gliding, and putty HEP; A/E recommendations  Person(s) Educated Patient   Methods Explanation;Demonstration;Handout   Comprehension Verbalized understanding;Returned demonstration             OT Long Term Goals - 10/09/16 1258      OT LONG TERM GOAL #1   Title Independent with HEP    Baseline dependent   Time 4   Period Weeks   Status Achieved     OT LONG TERM GOAL #2   Title Pt to report greater ease with writing, cutting and stirring food, and opening jars with A/E prn   Baseline max difficulty and/or unable   Time 4   Period Weeks   Status On-going     OT LONG TERM GOAL #3   Title Improve grip strength Rt hand by 10 lbs or greater to open containers/jars   Baseline eval = 25 lbs (Lt = 99 lbs)    Time 4   Period Weeks   Status On-going     OT LONG TERM GOAL #4   Title Pt to improve  coordination as evidenced by reducing speed on 9 hole peg test to 25 sec. or less Rt hand   Baseline eval = 30.03 sec (Lt = 22.53 sec)    Time 4   Period Weeks   Status New     OT LONG TERM GOAL #5   Title Pt to report greater ease with writing and less pain overall   Baseline max difficulty writing with pain up to 7/10   Time 4   Period Weeks   Status On-going               Plan - 10/09/16 1306    Clinical Impression Statement Pt met LTG #1 and has better understanding of A/E options and task modifications for painful/difficult tasks.    Rehab Potential Good   Current Impairments/barriers affecting progress: limited visits, co-morbidities   OT Frequency 1x / week   OT Treatment/Interventions Self-care/ADL training;Moist Heat;Fluidtherapy;DME and/or AE instruction;Splinting;Patient/family education;Compression bandaging;Therapeutic exercises;Therapeutic activities;Scar mobilization;Passive range of motion;Cryotherapy;Parrafin;Manual Therapy   Plan review previous HEP's prn, issue coordination HEP, fluidotherapy if available   Consulted and Agree with Plan of Care Patient      Patient will benefit from skilled therapeutic intervention in order to improve the following deficits and impairments:  Decreased coordination, Impaired sensation, Increased edema, Impaired UE functional use, Pain, Decreased strength, Decreased scar mobility  Visit Diagnosis: Pain in right hand  Muscle weakness (generalized)    Problem List Patient Active Problem List   Diagnosis Date Noted  . Vaginal itching 04/04/2016  . Dysuria 04/04/2016  . Mild dysplasia of cervix 11/24/2012  . Papanicolaou smear of cervix with low grade squamous intraepithelial lesion (LGSIL) 10/21/2012  . HTN (hypertension) 12/10/2011  . Fatigue 03/04/2010  . OBESITY, MORBID 02/10/2010  . ESSENTIAL HYPERTENSION, BENIGN 07/20/2009  . Secondary cardiomyopathy, unspecified 07/20/2009  . EDEMA 07/20/2009    Ballie,  Kelly Johnson, OTR/L 10/09/2016, 1:11 PM  [facility] [address] [place], [place], [address] Phone: [phone]   Fax:  [phone]  Name: Allison [name] MRN: [id number] Date of Birth: 06/01/1980 

## 2016-10-10 ENCOUNTER — Encounter: Payer: Self-pay | Admitting: Cardiology

## 2016-10-10 DIAGNOSIS — Z01810 Encounter for preprocedural cardiovascular examination: Secondary | ICD-10-CM | POA: Insufficient documentation

## 2016-10-10 DIAGNOSIS — I420 Dilated cardiomyopathy: Secondary | ICD-10-CM | POA: Insufficient documentation

## 2016-10-16 ENCOUNTER — Ambulatory Visit: Payer: Medicaid Other | Admitting: Occupational Therapy

## 2016-10-18 ENCOUNTER — Encounter: Payer: Medicaid Other | Admitting: Occupational Therapy

## 2016-10-22 ENCOUNTER — Ambulatory Visit: Payer: Medicaid Other | Admitting: Occupational Therapy

## 2016-10-30 ENCOUNTER — Encounter (HOSPITAL_COMMUNITY): Payer: Self-pay | Admitting: Emergency Medicine

## 2016-10-30 ENCOUNTER — Inpatient Hospital Stay (HOSPITAL_COMMUNITY)
Admission: EM | Admit: 2016-10-30 | Discharge: 2016-11-02 | DRG: 299 | Disposition: A | Discharge status: Home / Self Care | Payer: Medicaid Other | Attending: Internal Medicine | Admitting: Internal Medicine

## 2016-10-30 DIAGNOSIS — M479 Spondylosis, unspecified: Secondary | ICD-10-CM | POA: Diagnosis present

## 2016-10-30 DIAGNOSIS — I429 Cardiomyopathy, unspecified: Secondary | ICD-10-CM | POA: Diagnosis present

## 2016-10-30 DIAGNOSIS — Z79899 Other long term (current) drug therapy: Secondary | ICD-10-CM

## 2016-10-30 DIAGNOSIS — I2609 Other pulmonary embolism with acute cor pulmonale: Secondary | ICD-10-CM | POA: Diagnosis present

## 2016-10-30 DIAGNOSIS — G4700 Insomnia, unspecified: Secondary | ICD-10-CM | POA: Diagnosis present

## 2016-10-30 DIAGNOSIS — G43909 Migraine, unspecified, not intractable, without status migrainosus: Secondary | ICD-10-CM | POA: Diagnosis present

## 2016-10-30 DIAGNOSIS — Z23 Encounter for immunization: Secondary | ICD-10-CM

## 2016-10-30 DIAGNOSIS — Z791 Long term (current) use of non-steroidal anti-inflammatories (NSAID): Secondary | ICD-10-CM

## 2016-10-30 DIAGNOSIS — Y834 Other reconstructive surgery as the cause of abnormal reaction of the patient, or of later complication, without mention of misadventure at the time of the procedure: Secondary | ICD-10-CM | POA: Diagnosis present

## 2016-10-30 DIAGNOSIS — I82442 Acute embolism and thrombosis of left tibial vein: Secondary | ICD-10-CM | POA: Diagnosis present

## 2016-10-30 DIAGNOSIS — Z79891 Long term (current) use of opiate analgesic: Secondary | ICD-10-CM

## 2016-10-30 DIAGNOSIS — G8929 Other chronic pain: Secondary | ICD-10-CM | POA: Diagnosis present

## 2016-10-30 DIAGNOSIS — K219 Gastro-esophageal reflux disease without esophagitis: Secondary | ICD-10-CM | POA: Diagnosis present

## 2016-10-30 DIAGNOSIS — T8172XA Complication of vein following a procedure, not elsewhere classified, initial encounter: Secondary | ICD-10-CM | POA: Diagnosis present

## 2016-10-30 DIAGNOSIS — D649 Anemia, unspecified: Secondary | ICD-10-CM | POA: Diagnosis present

## 2016-10-30 DIAGNOSIS — M1990 Unspecified osteoarthritis, unspecified site: Secondary | ICD-10-CM | POA: Diagnosis present

## 2016-10-30 DIAGNOSIS — I110 Hypertensive heart disease with heart failure: Secondary | ICD-10-CM | POA: Diagnosis present

## 2016-10-30 DIAGNOSIS — I5032 Chronic diastolic (congestive) heart failure: Secondary | ICD-10-CM | POA: Diagnosis present

## 2016-10-30 DIAGNOSIS — Z888 Allergy status to other drugs, medicaments and biological substances status: Secondary | ICD-10-CM

## 2016-10-30 DIAGNOSIS — T81718A Complication of other artery following a procedure, not elsewhere classified, initial encounter: Principal | ICD-10-CM | POA: Diagnosis present

## 2016-10-30 DIAGNOSIS — I10 Essential (primary) hypertension: Secondary | ICD-10-CM | POA: Diagnosis present

## 2016-10-30 DIAGNOSIS — I2699 Other pulmonary embolism without acute cor pulmonale: Secondary | ICD-10-CM | POA: Diagnosis present

## 2016-10-30 DIAGNOSIS — R0602 Shortness of breath: Secondary | ICD-10-CM

## 2016-10-30 DIAGNOSIS — Z6841 Body Mass Index (BMI) 40.0 and over, adult: Secondary | ICD-10-CM

## 2016-10-30 LAB — CBC WITH DIFFERENTIAL/PLATELET
BASOS PCT: 1 %
Basophils Absolute: 0.1 10*3/uL (ref 0.0–0.1)
EOS ABS: 0.3 10*3/uL (ref 0.0–0.7)
Eosinophils Relative: 2 %
HEMATOCRIT: 31.4 % — AB (ref 36.0–46.0)
Hemoglobin: 10.2 g/dL — ABNORMAL LOW (ref 12.0–15.0)
LYMPHS ABS: 4.9 10*3/uL — AB (ref 0.7–4.0)
Lymphocytes Relative: 33 %
MCH: 26 pg (ref 26.0–34.0)
MCHC: 32.5 g/dL (ref 30.0–36.0)
MCV: 80.1 fL (ref 78.0–100.0)
MONOS PCT: 7 %
Monocytes Absolute: 1 10*3/uL (ref 0.1–1.0)
NEUTROS ABS: 8.5 10*3/uL — AB (ref 1.7–7.7)
NEUTROS PCT: 57 %
Platelets: 361 10*3/uL (ref 150–400)
RBC: 3.92 MIL/uL (ref 3.87–5.11)
RDW: 16.4 % — ABNORMAL HIGH (ref 11.5–15.5)
WBC: 14.8 10*3/uL — AB (ref 4.0–10.5)

## 2016-10-30 LAB — BASIC METABOLIC PANEL
Anion gap: 9 (ref 5–15)
BUN: 16 mg/dL (ref 6–20)
CO2: 21 mmol/L — ABNORMAL LOW (ref 22–32)
Calcium: 8.5 mg/dL — ABNORMAL LOW (ref 8.9–10.3)
Chloride: 106 mmol/L (ref 101–111)
Creatinine, Ser: 0.85 mg/dL (ref 0.44–1.00)
GFR calc Af Amer: >60 mL/min (ref >60)
Glucose, Bld: 92 mg/dL (ref 65–99)
Potassium: 4.3 mmol/L (ref 3.5–5.1)
SODIUM: 136 mmol/L (ref 135–145)

## 2016-10-30 LAB — I-STAT TROPONIN, ED: TROPONIN I, POC: 0.01 ng/mL (ref 0.00–0.08)

## 2016-10-30 LAB — BRAIN NATRIURETIC PEPTIDE: B Natriuretic Peptide: 15.6 pg/mL (ref 0.0–100.0)

## 2016-10-30 LAB — I-STAT BETA HCG BLOOD, ED (MC, WL, AP ONLY)

## 2016-10-30 NOTE — ED Triage Notes (Signed)
Pt states she had left shoulder surgery by Dr Thomasena Edisollins on October 18th  Pt is c/o shortness of breath that started earlier today  Pt states she gets short of breath with movement  Pt states she called her dr and was told to come here for evaluation

## 2016-10-31 ENCOUNTER — Inpatient Hospital Stay (HOSPITAL_COMMUNITY): Payer: Medicaid Other

## 2016-10-31 ENCOUNTER — Encounter (HOSPITAL_COMMUNITY): Payer: Self-pay

## 2016-10-31 ENCOUNTER — Emergency Department (HOSPITAL_COMMUNITY): Payer: Medicaid Other

## 2016-10-31 DIAGNOSIS — T8172XA Complication of vein following a procedure, not elsewhere classified, initial encounter: Secondary | ICD-10-CM | POA: Diagnosis present

## 2016-10-31 DIAGNOSIS — I361 Nonrheumatic tricuspid (valve) insufficiency: Secondary | ICD-10-CM

## 2016-10-31 DIAGNOSIS — D649 Anemia, unspecified: Secondary | ICD-10-CM | POA: Diagnosis present

## 2016-10-31 DIAGNOSIS — I429 Cardiomyopathy, unspecified: Secondary | ICD-10-CM | POA: Diagnosis present

## 2016-10-31 DIAGNOSIS — G894 Chronic pain syndrome: Secondary | ICD-10-CM

## 2016-10-31 DIAGNOSIS — G43909 Migraine, unspecified, not intractable, without status migrainosus: Secondary | ICD-10-CM | POA: Diagnosis present

## 2016-10-31 DIAGNOSIS — Z791 Long term (current) use of non-steroidal anti-inflammatories (NSAID): Secondary | ICD-10-CM | POA: Diagnosis not present

## 2016-10-31 DIAGNOSIS — I82442 Acute embolism and thrombosis of left tibial vein: Secondary | ICD-10-CM | POA: Diagnosis present

## 2016-10-31 DIAGNOSIS — K219 Gastro-esophageal reflux disease without esophagitis: Secondary | ICD-10-CM | POA: Diagnosis present

## 2016-10-31 DIAGNOSIS — I110 Hypertensive heart disease with heart failure: Secondary | ICD-10-CM | POA: Diagnosis present

## 2016-10-31 DIAGNOSIS — Z79899 Other long term (current) drug therapy: Secondary | ICD-10-CM | POA: Diagnosis not present

## 2016-10-31 DIAGNOSIS — I5032 Chronic diastolic (congestive) heart failure: Secondary | ICD-10-CM

## 2016-10-31 DIAGNOSIS — I2609 Other pulmonary embolism with acute cor pulmonale: Secondary | ICD-10-CM

## 2016-10-31 DIAGNOSIS — M479 Spondylosis, unspecified: Secondary | ICD-10-CM | POA: Diagnosis present

## 2016-10-31 DIAGNOSIS — T81718A Complication of other artery following a procedure, not elsewhere classified, initial encounter: Secondary | ICD-10-CM | POA: Diagnosis not present

## 2016-10-31 DIAGNOSIS — Z23 Encounter for immunization: Secondary | ICD-10-CM | POA: Diagnosis not present

## 2016-10-31 DIAGNOSIS — R0602 Shortness of breath: Secondary | ICD-10-CM | POA: Diagnosis not present

## 2016-10-31 DIAGNOSIS — I2699 Other pulmonary embolism without acute cor pulmonale: Secondary | ICD-10-CM

## 2016-10-31 DIAGNOSIS — G8929 Other chronic pain: Secondary | ICD-10-CM | POA: Diagnosis present

## 2016-10-31 DIAGNOSIS — Y834 Other reconstructive surgery as the cause of abnormal reaction of the patient, or of later complication, without mention of misadventure at the time of the procedure: Secondary | ICD-10-CM | POA: Diagnosis present

## 2016-10-31 DIAGNOSIS — I10 Essential (primary) hypertension: Secondary | ICD-10-CM | POA: Diagnosis not present

## 2016-10-31 DIAGNOSIS — I2692 Saddle embolus of pulmonary artery without acute cor pulmonale: Secondary | ICD-10-CM

## 2016-10-31 DIAGNOSIS — Z6841 Body Mass Index (BMI) 40.0 and over, adult: Secondary | ICD-10-CM | POA: Diagnosis not present

## 2016-10-31 DIAGNOSIS — Z888 Allergy status to other drugs, medicaments and biological substances status: Secondary | ICD-10-CM | POA: Diagnosis not present

## 2016-10-31 DIAGNOSIS — M1990 Unspecified osteoarthritis, unspecified site: Secondary | ICD-10-CM | POA: Diagnosis present

## 2016-10-31 DIAGNOSIS — G4700 Insomnia, unspecified: Secondary | ICD-10-CM | POA: Diagnosis present

## 2016-10-31 DIAGNOSIS — Z79891 Long term (current) use of opiate analgesic: Secondary | ICD-10-CM | POA: Diagnosis not present

## 2016-10-31 LAB — BASIC METABOLIC PANEL
Anion gap: 7 (ref 5–15)
BUN: 15 mg/dL (ref 6–20)
CALCIUM: 8.5 mg/dL — AB (ref 8.9–10.3)
CO2: 20 mmol/L — ABNORMAL LOW (ref 22–32)
CREATININE: 0.83 mg/dL (ref 0.44–1.00)
Chloride: 109 mmol/L (ref 101–111)
GFR calc Af Amer: >60 mL/min (ref >60)
GLUCOSE: 97 mg/dL (ref 65–99)
Potassium: 3.6 mmol/L (ref 3.5–5.1)
Sodium: 136 mmol/L (ref 135–145)

## 2016-10-31 LAB — CBC
HEMATOCRIT: 31.6 % — AB (ref 36.0–46.0)
HEMOGLOBIN: 10.4 g/dL — AB (ref 12.0–15.0)
MCH: 26.5 pg (ref 26.0–34.0)
MCHC: 32.9 g/dL (ref 30.0–36.0)
MCV: 80.6 fL (ref 78.0–100.0)
Platelets: 363 10*3/uL (ref 150–400)
RBC: 3.92 MIL/uL (ref 3.87–5.11)
RDW: 16.5 % — ABNORMAL HIGH (ref 11.5–15.5)
WBC: 15.1 10*3/uL — ABNORMAL HIGH (ref 4.0–10.5)

## 2016-10-31 LAB — HEPARIN LEVEL (UNFRACTIONATED): Heparin Unfractionated: 0.49 IU/mL (ref 0.30–0.70)

## 2016-10-31 LAB — MRSA PCR SCREENING: MRSA by PCR: NEGATIVE

## 2016-10-31 LAB — ECHOCARDIOGRAM COMPLETE
Height: 65 in
Weight: 4649.06 oz

## 2016-10-31 MED ORDER — BACLOFEN 10 MG PO TABS
10.0000 mg | ORAL_TABLET | Freq: Three times a day (TID) | ORAL | Status: DC | PRN
  Administered 2016-11-01: 10 mg via ORAL
  Filled 2016-10-31: qty 1

## 2016-10-31 MED ORDER — ONDANSETRON HCL 4 MG/2ML IJ SOLN: 4.0000 mg | Freq: Four times a day (QID) | INTRAMUSCULAR | Status: DC | PRN

## 2016-10-31 MED ORDER — IPRATROPIUM-ALBUTEROL 0.5-2.5 (3) MG/3ML IN SOLN: 3.0000 mL | RESPIRATORY_TRACT | Status: DC | PRN

## 2016-10-31 MED ORDER — ORAL CARE MOUTH RINSE
15.0000 mL | Freq: Two times a day (BID) | OROMUCOSAL | Status: DC
  Administered 2016-10-31 – 2016-11-01 (×3): 15 mL via OROMUCOSAL

## 2016-10-31 MED ORDER — HEPARIN BOLUS VIA INFUSION
4000.0000 [IU] | Freq: Once | INTRAVENOUS | Status: AC
  Administered 2016-10-31: 4000 [IU] via INTRAVENOUS
  Filled 2016-10-31: qty 4000

## 2016-10-31 MED ORDER — FUROSEMIDE 40 MG PO TABS
40.0000 mg | ORAL_TABLET | Freq: Every day | ORAL | Status: DC
  Administered 2016-10-31 – 2016-11-02 (×3): 40 mg via ORAL
  Filled 2016-10-31 (×3): qty 1

## 2016-10-31 MED ORDER — TOPIRAMATE 100 MG PO TABS
100.0000 mg | ORAL_TABLET | Freq: Two times a day (BID) | ORAL | Status: DC
  Administered 2016-10-31 – 2016-11-02 (×6): 100 mg via ORAL
  Filled 2016-10-31 (×6): qty 1

## 2016-10-31 MED ORDER — ACETAMINOPHEN 650 MG RE SUPP: 650.0000 mg | Freq: Four times a day (QID) | RECTAL | Status: DC | PRN

## 2016-10-31 MED ORDER — HEPARIN (PORCINE) IN NACL 100-0.45 UNIT/ML-% IJ SOLN
1750.0000 [IU]/h | INTRAMUSCULAR | Status: AC
  Administered 2016-10-31: 1750 [IU]/h via INTRAVENOUS
  Filled 2016-10-31 (×2): qty 250

## 2016-10-31 MED ORDER — PANTOPRAZOLE SODIUM 40 MG PO TBEC
40.0000 mg | DELAYED_RELEASE_TABLET | Freq: Every day | ORAL | Status: DC
  Administered 2016-10-31 – 2016-11-02 (×3): 40 mg via ORAL
  Filled 2016-10-31 (×3): qty 1

## 2016-10-31 MED ORDER — OXYCODONE HCL 5 MG PO TABS
5.0000 mg | ORAL_TABLET | Freq: Four times a day (QID) | ORAL | Status: DC
  Administered 2016-10-31 – 2016-11-02 (×10): 5 mg via ORAL
  Filled 2016-10-31 (×10): qty 1

## 2016-10-31 MED ORDER — IOPAMIDOL (ISOVUE-370) INJECTION 76%
INTRAVENOUS | Status: AC
  Administered 2016-10-31: 100 mL via INTRAVENOUS
  Filled 2016-10-31: qty 100

## 2016-10-31 MED ORDER — ONDANSETRON HCL 4 MG PO TABS: 4.0000 mg | ORAL_TABLET | Freq: Four times a day (QID) | ORAL | Status: DC | PRN

## 2016-10-31 MED ORDER — IOPAMIDOL (ISOVUE-370) INJECTION 76%
100.0000 mL | Freq: Once | INTRAVENOUS | Status: AC | PRN
  Administered 2016-10-31: 100 mL via INTRAVENOUS

## 2016-10-31 MED ORDER — ENALAPRIL MALEATE 10 MG PO TABS
10.0000 mg | ORAL_TABLET | Freq: Two times a day (BID) | ORAL | Status: DC
  Administered 2016-10-31 – 2016-11-02 (×5): 10 mg via ORAL
  Filled 2016-10-31 (×5): qty 1

## 2016-10-31 MED ORDER — RIVAROXABAN 15 MG PO TABS
15.0000 mg | ORAL_TABLET | Freq: Two times a day (BID) | ORAL | Status: DC
Start: 2016-10-31 — End: 2016-11-02
  Administered 2016-10-31 – 2016-11-02 (×4): 15 mg via ORAL
  Filled 2016-10-31 (×3): qty 1

## 2016-10-31 MED ORDER — INFLUENZA VAC SPLIT QUAD 0.5 ML IM SUSY
0.5000 mL | PREFILLED_SYRINGE | INTRAMUSCULAR | Status: AC
  Administered 2016-11-02: 0.5 mL via INTRAMUSCULAR
  Filled 2016-10-31: qty 0.5

## 2016-10-31 MED ORDER — DIPHENHYDRAMINE HCL 25 MG PO CAPS
25.0000 mg | ORAL_CAPSULE | Freq: Four times a day (QID) | ORAL | Status: DC | PRN
  Administered 2016-10-31: 25 mg via ORAL
  Filled 2016-10-31: qty 1

## 2016-10-31 MED ORDER — OXYCODONE HCL 5 MG PO TABS
5.0000 mg | ORAL_TABLET | ORAL | Status: DC | PRN
  Administered 2016-10-31 – 2016-11-01 (×2): 5 mg via ORAL
  Filled 2016-10-31 (×2): qty 1

## 2016-10-31 MED ORDER — OXYCODONE-ACETAMINOPHEN 10-325 MG PO TABS: 1.0000 | ORAL_TABLET | Freq: Four times a day (QID) | ORAL | Status: DC

## 2016-10-31 MED ORDER — OXYCODONE-ACETAMINOPHEN 5-325 MG PO TABS
1.0000 | ORAL_TABLET | Freq: Four times a day (QID) | ORAL | Status: DC
  Administered 2016-10-31 – 2016-11-02 (×10): 1 via ORAL
  Filled 2016-10-31 (×10): qty 1

## 2016-10-31 MED ORDER — OXYCODONE-ACETAMINOPHEN 5-325 MG PO TABS
2.0000 | ORAL_TABLET | Freq: Once | ORAL | Status: AC
  Administered 2016-10-31: 2 via ORAL
  Filled 2016-10-31: qty 2

## 2016-10-31 MED ORDER — RIVAROXABAN 15 MG PO TABS
15.0000 mg | ORAL_TABLET | Freq: Two times a day (BID) | ORAL | Status: DC
  Filled 2016-10-31: qty 1

## 2016-10-31 MED ORDER — MORPHINE SULFATE (PF) 2 MG/ML IV SOLN: 2.0000 mg | INTRAVENOUS | Status: DC

## 2016-10-31 MED ORDER — GABAPENTIN 300 MG PO CAPS
300.0000 mg | ORAL_CAPSULE | Freq: Three times a day (TID) | ORAL | Status: DC
  Administered 2016-10-31 – 2016-11-02 (×7): 300 mg via ORAL
  Filled 2016-10-31 (×7): qty 1

## 2016-10-31 MED ORDER — ACETAMINOPHEN 325 MG PO TABS: 650.0000 mg | ORAL_TABLET | Freq: Four times a day (QID) | ORAL | Status: DC | PRN

## 2016-10-31 MED ORDER — CYCLOBENZAPRINE HCL 5 MG PO TABS
5.0000 mg | ORAL_TABLET | Freq: Three times a day (TID) | ORAL | Status: DC | PRN
  Administered 2016-11-01 (×2): 5 mg via ORAL
  Filled 2016-10-31 (×2): qty 1

## 2016-10-31 MED ORDER — AMLODIPINE BESYLATE 5 MG PO TABS
5.0000 mg | ORAL_TABLET | Freq: Every day | ORAL | Status: DC
  Administered 2016-10-31 – 2016-11-02 (×3): 5 mg via ORAL
  Filled 2016-10-31 (×3): qty 1

## 2016-10-31 MED ORDER — MORPHINE SULFATE (PF) 4 MG/ML IV SOLN
2.0000 mg | INTRAVENOUS | Status: AC
  Administered 2016-10-31: 2 mg via INTRAVENOUS
  Filled 2016-10-31: qty 1

## 2016-10-31 NOTE — ED Provider Notes (Signed)
Care [facility] from [name], [place].  Please see her full H&P.>    Allison Allison [name] M Allison [name] is a 36 y.o. female to the emergency department with worsening shortness of breath with exertion onset this morning.  Patient has a history of postpartum cardiomyopathy and obesity.  6 days ago she had a left shoulder [name] by Dr. Thomasena Edisollins.  No history of DVT, CAD, asthma.  No oral contraceptives or recent travel.  This periods of immobilization since her [name].  Physical Exam  BP 111/70 (BP Location: Right Arm)   Pulse 88   Temp 98.2 F (36.8 C) (Oral)   Resp 16   Ht 5\' 5"  (1.651 m)   Wt 134.3 kg (296 lb)   LMP 10/19/2016 (Approximate) Comment: negative beta HCG 10/30/16  SpO2 96%   BMI 49.26 kg/m   Physical Exam  Constitutional: She appears well-developed and well-nourished. No distress.  HENT:  Head: Normocephalic.  Eyes: Conjunctivae are normal. No scleral icterus.  Neck: Normal range of motion.  Cardiovascular: Normal rate and intact distal pulses.   Pulmonary/Chest: Effort normal.  Musculoskeletal: Normal range of motion.  Left shoulder in sling  Neurological: She is alert.  Skin: Skin is warm and dry.  Nursing note and vitals reviewed.   Results for orders placed or performed during the hospital encounter of 10/30/16  CBC with Differential  Result Value Ref Range   WBC 14.8 (H) 4.0 - 10.5 K/uL   RBC 3.92 3.87 - 5.11 MIL/uL   Hemoglobin 10.2 (L) 12.0 - 15.0 g/dL   HCT 16.131.4 (L) 09.636.0 - 04.546.0 %   MCV 80.1 78.0 - 100.0 fL   MCH 26.0 26.0 - 34.0 pg   MCHC 32.5 30.0 - 36.0 g/dL   RDW 40.916.4 (H) 81.111.5 - 91.415.5 %   Platelets 361 150 - 400 K/uL   Neutrophils Relative % 57 %   Neutro Abs 8.5 (H) 1.7 - 7.7 K/uL   Lymphocytes Relative 33 %   Lymphs Abs 4.9 (H) 0.7 - 4.0 K/uL   Monocytes Relative 7 %   Monocytes Absolute 1.0 0.1 - 1.0 K/uL   Eosinophils Relative 2 %   Eosinophils Absolute 0.3 0.0 - 0.7 K/uL   Basophils Relative 1 %   Basophils Absolute 0.1 0.0 - 0.1 K/uL  Basic  metabolic panel  Result Value Ref Range   Sodium 136 135 - 145 mmol/L   Potassium 4.3 3.5 - 5.1 mmol/L   Chloride 106 101 - 111 mmol/L   CO2 21 (L) 22 - 32 mmol/L   Glucose, Bld 92 65 - 99 mg/dL   BUN 16 6 - 20 mg/dL   Creatinine, Ser 7.820.85 0.44 - 1.00 mg/dL   Calcium 8.5 (L) 8.9 - 10.3 mg/dL   GFR calc non Af Amer >60 >60 mL/min   GFR calc Af Amer >60 >60 mL/min   Anion gap 9 5 - 15  Brain natriuretic peptide  Result Value Ref Range   B Natriuretic Peptide 15.6 0.0 - 100.0 pg/mL  I-Stat Troponin, ED [facility])  Result Value Ref Range   Troponin i, poc 0.01 0.00 - 0.08 ng/mL   Comment 3          I-Stat Beta hCG blood, ED ([facility], [facility], [facility] only)  Result Value Ref Range   I-stat hCG, quantitative <5.0 <5 mIU/mL   Comment 3           Ct Angio Chest Pe W And/or Wo Contrast  Result Date: 10/31/2016 CLINICAL DATA:  Acute onset of shortness of breath. Status post left shoulder [name] on October 18th. Leukocytosis. Initial encounter. EXAM: CT ANGIOGRAPHY CHEST WITH CONTRAST TECHNIQUE: Multidetector CT imaging of the chest was performed using the standard protocol during bolus administration of intravenous contrast. Multiplanar CT image reconstructions and MIPs were obtained to evaluate the vascular anatomy. CONTRAST:  100 mL of Isovue 370 IV contrast COMPARISON:  Chest radiograph performed 08/24/2014 FINDINGS: Cardiovascular: Evaluation for pulmonary embolus is suboptimal due to limitations in the timing of the contrast bolus. However, there appears to be pulmonary embolus within the right main pulmonary artery, extending into all lobes of the right lung, and there is suspicion of pulmonary embolus at the left main pulmonary artery, extending into the left upper and lower lobes. The RV/LV ratio of 1.1 corresponds to right heart strain and at least submassive pulmonary embolus. The heart remains normal in size. The thoracic aorta is unremarkable. The great vessels are unremarkable in appearance.  Mediastinum/Nodes: The mediastinum is otherwise unremarkable. No mediastinal lymphadenopathy is seen. No pericardial effusion is identified. A small hypodensity at the right thyroid lobe is likely benign, given its size. No axillary lymphadenopathy is seen. Lungs/Pleura: The lungs are clear bilaterally. No focal consolidation, pleural effusion or pneumothorax is seen. No masses are identified. Upper Abdomen: The visualized portions of the liver and spleen are unremarkable. Musculoskeletal: No acute osseous abnormalities are identified. The visualized musculature is unremarkable in appearance. Review of the MIP images confirms the above findings. IMPRESSION: 1. Suspect pulmonary embolus within the main pulmonary arteries bilaterally, extending to all lobes of both lungs. CT evidence of right heart strain (RV/LV Ratio = 1.1) consistent with at least submassive (intermediate risk) PE. The presence of right heart strain has been associated with an increased risk of morbidity and mortality. Please activate Code PE by paging [phone]. 2. Lungs clear bilaterally. Critical Value/emergent results were called by telephone at the time of interpretation on 10/31/2016 at 1:34 am to [name] PA, who verbally acknowledged these results. Electronically Signed   By: Roanna Raider M.D.   On: 10/31/2016 01:37     ED Course  Procedures  Clinical Course as of Nov 01 230  Wed Oct 31, 2016  0106 Plan: CT angio to assess for possible PE  [HM]  0208 Code PE called.  Discussed with Allison [name] who recommends a 2D echo, stepdown bed and hospitalist admission.  Heparin has been ordered.    [HM]  0231 Discussed with Dr. Katrinka [name] who will admit  [HM]    Clinical Course User Index [HM] Allison Allison [name], Boyd Kerbs     MDM Patient with shortness of breath after shoulder [name].  CT scan shows bilateral pulmonary embolism with right heart strain.  Heparin will be initiated.  Patient will need admission for further  evaluation and treatment.   SOB (shortness of breath)  Other acute pulmonary embolism with acute cor pulmonale [facility])     Allison Allison [name], Boyd Kerbs 10/31/16 0232    Palumbo, April, MD 10/31/16 [phone]

## 2016-10-31 NOTE — Progress Notes (Signed)
ANTICOAGULATION CONSULT NOTE - Initial Consult  Pharmacy Consult for Heparin Indication: pulmonary embolus  Allergies  Allergen Reactions  . Cymbalta [Duloxetine Hcl] Other (See Comments)    Paranoia/nauseous/anxious feeling.  . Reglan [Metoclopramide] Anxiety    Patient Measurements: Height: 5\' 5"  (165.1 cm) Weight: 296 lb (134.3 kg) IBW/kg (Calculated) : 57 Heparin Dosing Weight:   Vital Signs: Temp: 98.2 F (36.8 C) (10/23 2046) Temp Source: Oral (10/23 2046) BP: 111/70 (10/23 2315) Pulse Rate: 86 (10/24 0202)  Labs:  Recent Labs  10/30/16 2259  HGB 10.2*  HCT 31.4*  PLT 361  CREATININE 0.85    Estimated Creatinine Clearance: 128.2 mL/min (by C-G formula based on SCr of 0.85 mg/dL).   Medical History: Past Medical History:  Diagnosis Date  . Arthritis   . 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 Carpal tunnel syndrome of right wrist   . Chronic back pain   . Gallstones   . GERD (gastroesophageal reflux disease)    takes Omeprazole daily  . History of migraine   . Migraines   . Morbid obesity (HCC)   . Nausea    takes Phenergan as needed  . Osteoarthritis    takes Relafen daily  . PIH (pregnancy induced hypertension)    takes Coreg,Enalapril,and Amlodipine daily    Medications:  Infusions:  . heparin      Assessment: Patient with PE. No oral anticoagulants noted on med rec.   Goal of Therapy:  Heparin level 0.3-0.7 units/ml Monitor platelets by anticoagulation protocol: Yes   Plan:  Heparin bolus 4000 units iv x1 Heparin drip at 1750 units/hr Daily CBC Next heparin level at 1000    Darlina GuysGrimsley Jr, Jacquenette ShoneJulian Crowford 10/31/2016,2:13 AM

## 2016-10-31 NOTE — Progress Notes (Addendum)
  Echocardiogram 2D Echocardiogram has been performed.  Patient post shoulder surgery. Unable to turn body in order to lay in accurate position.   Allison [name] 10/31/2016, 10:41 AM

## 2016-10-31 NOTE — Care Management Note (Signed)
Case Management Note  Patient Details  Name: Allison RakersSeamon M [name] MRN: [id number] Date of Birth: 02/02/1980  Subjective/Objective:                  dyspnea  Action/Plan: Date:  October 31 2016 Chart reviewed for concurrent status and case management needs.  Will continue to follow patient progress.  Discharge Planning: following for needs  Expected discharge date: November 03, 2016  Marcelle SmilingRhonda Davis, BSN, [place]3, [place]CCM   [phone]   Expected Discharge Date:                  Expected Discharge Plan:  Home/Self Care  In-House Referral:     Discharge planning Services  CM Consult  Post Acute Care Choice:    Choice offered to:     DME Arranged:    DME Agency:     HH Arranged:    HH Agency:     Status of Service:  In process, will continue to follow  If discussed at Long Length of Stay Meetings, dates discussed:    Additional Comments:  Golda AcreDavis, Rhonda Lynn, RN 10/31/2016, 9:40 AM

## 2016-10-31 NOTE — Progress Notes (Addendum)
ANTICOAGULATION CONSULT NOTE   Pharmacy Consult for Heparin --> rivaroxaban Indication: pulmonary embolus  Allergies  Allergen Reactions  . Cymbalta [Duloxetine Hcl] Other (See Comments)    Paranoia/nauseous/anxious feeling.  . Reglan [Metoclopramide] Anxiety    Patient Measurements: Height: 5\' 5"  (165.1 cm) Weight: 290 lb 9.1 oz (131.8 kg) IBW/kg (Calculated) : 57 Heparin Dosing Weight:   Vital Signs: Temp: 97.9 F (36.6 C) (10/24 0803) Temp Source: Oral (10/24 0803) BP: 145/80 (10/24 0900) Pulse Rate: 80 (10/24 0900)  Labs:  Recent Labs  10/30/16 2259 10/31/16 0400 10/31/16 0952  HGB 10.2* 10.4*  --   HCT 31.4* 31.6*  --   PLT 361 363  --   HEPARINUNFRC  --   --  0.49  CREATININE 0.85 0.83  --     Estimated Creatinine Clearance: 129.8 mL/min (by C-G formula based on SCr of 0.83 mg/dL).  Assessment: [age] YOF presents with acute PE following shoulder surgery.  Pharmacy asked to dose heparin gtt.   Today, 10/31/2016  Initial heparin level is therapeutic  CBC: Hgb decreased but consistent with baseline, pltc WNL  No obvious bleeding noted  LE duplex revealed LLE DVT  ECHO Performed earlier this am  Addendum:   Orders to change heparin to rivaroxaban   Goal of Therapy:  Heparin level 0.3-0.7 units/ml Monitor platelets by anticoagulation protocol: Yes   Plan:   Continue heparin gtt at 1750 units/hr until 17:00, at 17:00 start rivaroxaban 15mg  BIDWC x 21 days followed by 20mg  daily with supper.    Stop heparin gtt with 1st dose of xarelto at 1700  D/C heparin labs  Monitor for bleeding   Juliette Alcideustin Zeigler, PharmD, BCPS.   Pager: [phone] 10/31/2016 11:07 AM

## 2016-10-31 NOTE — ED Provider Notes (Signed)
[facility]-EMERGENCY DEPT Provider Note   CSN: [id number] Arrival date & time: 10/30/16  2032     History   Chief Complaint Chief Complaint  Patient presents with  . Shortness of Breath    HPI Allison RakersSeamon M Scalese is a 36 y.o. female.  HPI Allison [name] is a 36 y.o. female presents to ED with complaint of shortness of breath. Pt with hx of prior GERD, post partum cardiomyopathy, obesity. Pt had left shoulder surgery 6 days ago for "frozen shoulder" by Dr. Thomasena Edisollins. States this morning woke up feeling short of breath. Shortness of breath is worse on exertion. She states she gets winded even walking several steps. Symptoms improve with rest. Denies any chest pain, states it is "tight." Denies any pain or swelling in her extremities other than the left shoulder. Denies being on any birth control. No recent travel. States she has been "just laying around" since her surgery. No history of blood clots. No history of coronary disease. No history of asthma. Never experienced similar symptoms in the past.  Past Medical History:  Diagnosis Date  . Arthritis   . 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 Carpal tunnel syndrome of right wrist   . Chronic back pain   . Gallstones   . GERD (gastroesophageal reflux disease)    takes Omeprazole daily  . History of migraine   . Migraines   . Morbid obesity (HCC)   . Nausea    takes Phenergan as needed  . Osteoarthritis    takes Relafen daily  . PIH (pregnancy induced hypertension)    takes Coreg,Enalapril,and Amlodipine daily    Patient Active Problem List   Diagnosis Date Noted  . Congestive dilated cardiomyopathy (HCC) 10/10/2016  . Preop cardiovascular exam 10/10/2016  . Vaginal itching 04/04/2016  . Dysuria 04/04/2016  . Mild dysplasia of cervix 11/24/2012  . Papanicolaou smear of cervix with low grade squamous intraepithelial lesion (LGSIL) 10/21/2012  . HTN (hypertension) 12/10/2011  . Fatigue  03/04/2010  . OBESITY, MORBID 02/10/2010  . ESSENTIAL HYPERTENSION, BENIGN 07/20/2009  . Secondary cardiomyopathy, unspecified 07/20/2009  . EDEMA 07/20/2009    Past Surgical History:  Procedure Laterality Date  . CARPAL TUNNEL RELEASE Right 08/02/2016   Procedure: CARPAL TUNNEL RELEASE RIGHT;  Surgeon: Coletta Memosabbell, Kyle, MD;  [facility];  Service: Neurosurgery;  Laterality: Right;  right  . CESAREAN SECTION  2011  . CHOLECYSTECTOMY N/A 01/23/2013   Procedure: LAPAROSCOPIC CHOLECYSTECTOMY;  Surgeon: Atilano InaEric M Wilson, MD;  [facility];  Service: General;  Laterality: N/A;  . TUBAL LIGATION  2011    OB History    Gravida Para Term Preterm AB Living   3 3 3     3    SAB TAB Ectopic Multiple Live Births           3       Home Medications    Prior to Admission medications   Medication Sig Start Date End Date Taking? Authorizing Provider  amitriptyline (ELAVIL) 25 MG tablet Take 25 mg by mouth at bedtime.   Yes [provider]  amLODipine (NORVASC) 5 MG tablet Take 5 mg by mouth daily after breakfast.  04/26/14  Yes [provider]  baclofen (LIORESAL) 10 MG tablet Take 10 mg by mouth every 8 (eight) hours as needed for muscle spasms.  10/25/16  Yes [provider]  carvedilol (COREG) 25 MG tablet Take 1 tablet (25 mg total) by mouth 2 (two) times daily with a  meal. 10/08/16  Yes Hochrein, Fayrene Fearing, MD  cephALEXin (KEFLEX) 500 MG capsule Take 500 mg by mouth 3 (three) times daily. 10/25/16  Yes [provider]  [employer] D3 1000 units capsule Take 1,000 Units by mouth daily after breakfast.  06/30/16  Yes [provider]  cyclobenzaprine (FLEXERIL) 5 MG tablet Take 5 mg by mouth 3 (three) times daily.  05/23/14  Yes [provider]  DENTA 5000 PLUS 1.1 % CREA dental cream Take 1 application by mouth daily. 05/28/14  Yes [provider]  diclofenac (VOLTAREN) 75 MG EC tablet Take 75 mg by mouth 2 (two) times daily. 05/05/14  Yes [provider]  enalapril (VASOTEC) 10 MG tablet Take 10 mg by mouth 2 (two) times daily.  04/18/10  Yes Rollene Rotunda, MD  furosemide (LASIX) 40 MG tablet Take 40 mg by mouth daily with breakfast.   Yes [provider]  gabapentin (NEURONTIN) 300 MG capsule Take 300 mg by mouth 3 (three) times daily.    Yes [provider]  omeprazole (PRILOSEC) 20 MG capsule Take 20 mg by mouth daily before breakfast.    Yes [provider]  oxyCODONE (OXY IR/ROXICODONE) 5 MG immediate release tablet Take 5 mg by mouth every 4 (four) hours as needed for moderate pain or severe pain. for pain 10/25/16  Yes [provider]  oxyCODONE-acetaminophen (PERCOCET) 10-325 MG per tablet Take 1 tablet by mouth every 6 (six) hours.  06/23/14  Yes [provider]  SUMAtriptan (IMITREX) 50 MG tablet Take 50 mg by mouth daily as needed for migraine. May repeat in 2 hours if headache persists or recurs.    Yes [provider]  topiramate (TOPAMAX) 100 MG tablet Take 100 mg by mouth 2 (two) times daily.   Yes [provider]  Melatonin 3 MG CAPS Take 1 capsule (3 mg total) by mouth at bedtime. Patient not taking: Reported on 10/30/2016 05/01/16   Coralyn Helling, MD    Family History Family History  Problem Relation Age of Onset  . Hypertension Mother   . Diabetes Mother   . Heart disease Mother   . Miscarriages / [place] Mother   . COPD Mother   . Arthritis Mother   . Alcohol abuse Father   . Asthma Father   . Diabetes Maternal Grandmother   . Heart disease Maternal Grandmother   . Cancer Maternal Grandfather        leukemia  . Arthritis Maternal Grandfather   . Cancer Paternal Grandmother        breast  . Cancer Paternal Grandfather        leukemia    Social History Social History  Substance Use Topics  . Smoking status: Never Smoker  . Smokeless tobacco: Never Used  . Alcohol use No     Allergies   Cymbalta [duloxetine hcl] and Reglan  [metoclopramide]   Review of Systems Review of Systems  Constitutional: Negative for chills and fever.  Respiratory: Positive for chest tightness and shortness of breath. Negative for cough.   Cardiovascular: Negative for chest pain, palpitations and leg swelling.  Gastrointestinal: Negative for abdominal pain, diarrhea, nausea and vomiting.  Genitourinary: Negative for dysuria, flank pain, pelvic pain, vaginal bleeding, vaginal discharge and vaginal pain.  Musculoskeletal: Positive for arthralgias. Negative for myalgias, neck pain and neck stiffness.  Skin: Negative for rash.  Neurological: Negative for dizziness, weakness and headaches.  All other systems reviewed and are negative.    Physical Exam Updated  Vital Signs BP 111/70 (BP Location: Right Arm)   Pulse 88   Temp 98.2 F (36.8 C) (Oral)   Resp 16   Ht 5\' 5"  (1.651 m)   Wt 134.3 kg (296 lb)   LMP 10/19/2016 (Approximate)   SpO2 96%   BMI 49.26 kg/m   Physical Exam  Constitutional: She appears well-developed and well-nourished. No distress.  HENT:  Head: Normocephalic.  Eyes: Conjunctivae are normal.  Neck: Neck supple.  Cardiovascular: Normal rate, regular rhythm and normal heart sounds.   Pulmonary/Chest: Effort normal and breath sounds normal. No respiratory distress. She has no wheezes. She has no rales.  Abdominal: Soft. Bowel sounds are normal. She exhibits no distension. There is no tenderness. There is no rebound.  Musculoskeletal: She exhibits no edema.  No tenderness or swelling to bilateral lower extremities. Mild tenderness to the left shoulder, incisions appeared to be normal with no erythema, drainage, swelling. Distal radial pulse intact in the left arm. Grip is normal, 5 out of 5.  Neurological: She is alert.  Skin: Skin is warm and dry.  Psychiatric: She has a normal mood and affect. Her behavior is normal.  Nursing note and vitals reviewed.    ED Treatments / Results  Labs (all labs ordered  are listed, but only abnormal results are displayed) Labs Reviewed  CBC WITH DIFFERENTIAL/PLATELET - Abnormal; Notable for the following:       Result Value   WBC 14.8 (*)    Hemoglobin 10.2 (*)    HCT 31.4 (*)    RDW 16.4 (*)    Neutro Abs 8.5 (*)    Lymphs Abs 4.9 (*)    All other components within normal limits  BASIC METABOLIC PANEL - Abnormal; Notable for the following:    CO2 21 (*)    Calcium 8.5 (*)    All other components within normal limits  BRAIN NATRIURETIC PEPTIDE  I-STAT TROPONIN, ED  I-STAT BETA HCG BLOOD, ED ([facility], [facility], [facility] ONLY)    EKG  EKG Interpretation  Date/Time:  Tuesday October 30 2016 23:13:08 EDT Ventricular Rate:  88 PR Interval:    QRS Duration: 93 QT Interval:  362 QTC Calculation: 438 R Axis:   0 Text Interpretation:  Sinus rhythm Low voltage, precordial leads No significant change since last tracing Confirmed by Alvira Monday ([id number]) on 10/31/2016 12:02:04 AM       Radiology No results found.  Procedures Procedures (including critical care time)  Medications Ordered in ED Medications - No data to display   Initial Impression / Assessment and Plan / ED Course  I have reviewed the triage vital signs and the nursing notes.  Pertinent labs & imaging results that were available during my care of the patient were reviewed by me and considered in my medical decision making (see chart for details).  Clinical Course as of Nov 01 56  Wed Oct 31, 2016  0106 Plan: CT angio to assess for possible PE  [HM]  0208 Code PE called.  Discussed with E Link who recommends a 2D echo, stepdown bed and hospitalist admission.  Heparin has been ordered.    [HM]  0231 Discussed with Dr. Katrinka Blazing who will admit  [HM]    Clinical Course User Index [HM] Muthersbaugh, Dahlia Client, PA-C    Patient with acute onset of shortness of breath that started this morning. Patient is certainly the risk of developing pulmonary embolism with recent surgery and immobility for  the last week. We'll get CT angiogram  of further evaluation as a ddimer most likely will be positive in setting of recent surgery. Will also check basic labs, ECG, trop, bnp.   1:07 AM Trop, bnp negative. ECG with no significant findings. CT angio pending. Discussed results with pt. Discussed plan. Signed out at shift change pending CT angio.   Vitals:   10/30/16 2046 10/30/16 2047 10/30/16 2315 10/30/16 2322  BP: 102/63  111/70   Pulse: 99  88   Resp: 18  16   Temp: 98.2 F (36.8 C)     TempSrc: Oral     SpO2: 93%  96% 96%  Weight:  134.3 kg (296 lb)    Height:  5\' 5"  (1.651 m)      Final Clinical Impressions(s) / ED Diagnoses   Final diagnoses:  None    New Prescriptions New Prescriptions   No medications on file     Jaynie Crumble, PA-C 11/01/16 0059    Alvira Monday, MD 11/04/16 [phone]

## 2016-10-31 NOTE — H&P (Signed)
History and Physical    Allison [name] WJX:[id number] DOB: 07-30-1980 DOA: 10/30/2016  Referring MD/NP/PA: Dierdre Forth, PA-C PCP: Jackie Plum, MD  Patient coming from: Home  Chief Complaint: Shortness of breath  HPI: Allison [name] is a 36 y.o. female with medical history significant of NICM, diastolic CHF with EF noted to be 55-60% with grade 2 diastolic dysfunction in 2014, osteoarthritis of the spine, chronic pain, morbid obesity; who presents with complaints of shortness of breath starting yesterday. Patient recently had left shoulder surgery performed by Dr. Thomasena Edis on October 18. Prior to the patient having surgery she reports being seen by her cardiologist Dr. Antoine Poche who gave her clearance. Following the surgery patient notes that she was mostly sedimentary. Yesterday morning she had acute onset of shortness of breath that she describes feeling like pressure. Complained of it feeling hard to take a deep breath in. She denies having any history of blood clots, and was not on any blood thinners following the surgery. Denies having any leg swelling, calf pain, chest pain, palpitations, lightheadedness, or loss of consciousness.  ED Course: Upon admission into the emergency department patient was found to have vital signs relatively within normal limits with oxygen saturation maintained on room air. CT angiogram of the chest revealed signs of extensive bilateral pulmonary embolus with signs of right heart strain. Patient was placed on heparin drip. PCCM was notified and recommended admission to stepdown. TRH called to admit.  Review of Systems  Constitutional: Negative for malaise/fatigue and weight loss.  HENT: Negative for ear discharge and nosebleeds.   Eyes: Negative for photophobia and pain.  Respiratory: Positive for shortness of breath. Negative for cough and wheezing.   Cardiovascular: Negative for chest pain and leg swelling.  Gastrointestinal: Negative for  abdominal pain, nausea and vomiting.  Genitourinary: Negative for flank pain and frequency.  Musculoskeletal: Positive for back pain. Negative for falls and myalgias.  Skin: Negative for itching and rash.  Neurological: Negative for focal weakness and seizures.  Psychiatric/Behavioral: Negative for memory loss and substance abuse.    Past Medical History:  Diagnosis Date  . Arthritis   . 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 Carpal tunnel syndrome of right wrist   . Chronic back pain   . Gallstones   . GERD (gastroesophageal reflux disease)    takes Omeprazole daily  . History of migraine   . Migraines   . Morbid obesity (HCC)   . Nausea    takes Phenergan as needed  . Osteoarthritis    takes Relafen daily  . PIH (pregnancy induced hypertension)    takes Coreg,Enalapril,and Amlodipine daily    Past Surgical History:  Procedure Laterality Date  . CARPAL TUNNEL RELEASE Right 08/02/2016   Procedure: CARPAL TUNNEL RELEASE RIGHT;  Surgeon: Coletta Memos, MD;  Location: [facility] OR;  Service: Neurosurgery;  Laterality: Right;  right  . CESAREAN SECTION  2011  . CHOLECYSTECTOMY N/A 01/23/2013   Procedure: LAPAROSCOPIC CHOLECYSTECTOMY;  Surgeon: Atilano Ina, MD;  Location: [facility] OR;  Service: General;  Laterality: N/A;  . TUBAL LIGATION  2011     reports that she has never smoked. She has never used smokeless tobacco. She reports that she does not drink alcohol or use drugs.  Allergies  Allergen Reactions  . Cymbalta [Duloxetine Hcl] Other (See Comments)    Paranoia/nauseous/anxious feeling.  . Reglan [Metoclopramide] Anxiety    Family History  Problem Relation Age of Onset  . Hypertension Mother   . Diabetes Mother   .  Heart disease Mother   . Miscarriages / [place] Mother   . COPD Mother   . Arthritis Mother   . Alcohol abuse Father   . Asthma Father   . Diabetes Maternal Grandmother   . Heart disease Maternal Grandmother   . Cancer Maternal Grandfather         leukemia  . Arthritis Maternal Grandfather   . Cancer Paternal Grandmother        breast  . Cancer Paternal Grandfather        leukemia    Prior to Admission medications   Medication Sig Start Date End Date Taking? Authorizing Provider  amitriptyline (ELAVIL) 25 MG tablet Take 25 mg by mouth at bedtime.   Yes [provider]  amLODipine (NORVASC) 5 MG tablet Take 5 mg by mouth daily after breakfast.  04/26/14  Yes [provider]  baclofen (LIORESAL) 10 MG tablet Take 10 mg by mouth every 8 (eight) hours as needed for muscle spasms.  10/25/16  Yes [provider]  carvedilol (COREG) 25 MG tablet Take 1 tablet (25 mg total) by mouth 2 (two) times daily with a meal. 10/08/16  Yes Hochrein, Fayrene Fearing, MD  cephALEXin (KEFLEX) 500 MG capsule Take 500 mg by mouth 3 (three) times daily. 10/25/16  Yes [provider]  [employer] D3 1000 units capsule Take 1,000 Units by mouth daily after breakfast.  06/30/16  Yes [provider]  cyclobenzaprine (FLEXERIL) 5 MG tablet Take 5 mg by mouth 3 (three) times daily.  05/23/14  Yes [provider]  DENTA 5000 PLUS 1.1 % CREA dental cream Take 1 application by mouth daily. 05/28/14  Yes [provider]  diclofenac (VOLTAREN) 75 MG EC tablet Take 75 mg by mouth 2 (two) times daily. 05/05/14  Yes [provider]  enalapril (VASOTEC) 10 MG tablet Take 10 mg by mouth 2 (two) times daily.  04/18/10  Yes Rollene Rotunda, MD  furosemide (LASIX) 40 MG tablet Take 40 mg by mouth daily with breakfast.   Yes [provider]  gabapentin (NEURONTIN) 300 MG capsule Take 300 mg by mouth 3 (three) times daily.    Yes [provider]  omeprazole (PRILOSEC) 20 MG capsule Take 20 mg by mouth daily before breakfast.    Yes [provider]  oxyCODONE (OXY IR/ROXICODONE) 5 MG immediate release tablet Take 5 mg by mouth every 4 (four) hours as needed for moderate pain or severe pain. for pain  10/25/16  Yes [provider]  oxyCODONE-acetaminophen (PERCOCET) 10-325 MG per tablet Take 1 tablet by mouth every 6 (six) hours.  06/23/14  Yes [provider]  SUMAtriptan (IMITREX) 50 MG tablet Take 50 mg by mouth daily as needed for migraine. May repeat in 2 hours if headache persists or recurs.    Yes [provider]  topiramate (TOPAMAX) 100 MG tablet Take 100 mg by mouth 2 (two) times daily.   Yes [provider]  Melatonin 3 MG CAPS Take 1 capsule (3 mg total) by mouth at bedtime. Patient not taking: Reported on 10/30/2016 05/01/16   Coralyn Helling, MD    Physical Exam:  Constitutional: NAD, calm, comfortable Vitals:   10/30/16 2047 10/30/16 2315 10/30/16 2322 10/31/16 0202  BP:  111/70    Pulse:  88  86  Resp:  16  (!) 22  Temp:      TempSrc:      SpO2:  96% 96% 99%  Weight: 134.3 kg (296 lb)  Height: 5\' 5"  (1.651 m)      Eyes: PERRL, lids and conjunctivae normal ENMT: Mucous membranes are moist. Posterior pharynx clear of any exudate or lesions.Normal dentition.  Neck: normal, supple, no masses, no thyromegaly Respiratory: clear to auscultation bilaterally, no wheezing, no crackles. Normal respiratory effort. No accessory muscle use.  Cardiovascular: Regular rate and rhythm, no murmurs / rubs / gallops. No extremity edema. 2+ pedal pulses. No carotid bruits.  Abdomen: no tenderness, no masses palpated. No hepatosplenomegaly. Bowel sounds positive.  Musculoskeletal: no clubbing / cyanosis. No joint deformity upper and lower extremities. Good ROM, no contractures. Normal muscle tone.  Skin: no rashes, lesions, ulcers. No induration Neurologic: CN 2-12 grossly intact. Sensation intact, DTR normal. Strength 5/5 in all 4.  Psychiatric: Normal judgment and insight. Alert and oriented x 3. Normal mood.     Labs on Admission: I have personally reviewed following labs and imaging studies  CBC:  Recent Labs Lab 10/30/16 2259  WBC 14.8*    NEUTROABS 8.5*  HGB 10.2*  HCT 31.4*  MCV 80.1  PLT 361   Basic Metabolic Panel:  Recent Labs Lab 10/30/16 2259  NA 136  K 4.3  CL 106  CO2 21*  GLUCOSE 92  BUN 16  CREATININE 0.85  CALCIUM 8.5*   GFR: Estimated Creatinine Clearance: 128.2 mL/min (by C-G formula based on SCr of 0.85 mg/dL). Liver Function Tests: No results for input(s): AST, ALT, ALKPHOS, BILITOT, PROT, ALBUMIN in the last 168 hours. No results for input(s): LIPASE, AMYLASE in the last 168 hours. No results for input(s): AMMONIA in the last 168 hours. Coagulation Profile: No results for input(s): INR, PROTIME in the last 168 hours. Cardiac Enzymes: No results for input(s): CKTOTAL, CKMB, CKMBINDEX, TROPONINI in the last 168 hours. BNP (last 3 results) No results for input(s): PROBNP in the last 8760 hours. HbA1C: No results for input(s): HGBA1C in the last 72 hours. CBG: No results for input(s): GLUCAP in the last 168 hours. Lipid Profile: No results for input(s): CHOL, HDL, LDLCALC, TRIG, CHOLHDL, LDLDIRECT in the last 72 hours. Thyroid Function Tests: No results for input(s): TSH, T4TOTAL, FREET4, T3FREE, THYROIDAB in the last 72 hours. Anemia Panel: No results for input(s): VITAMINB12, FOLATE, FERRITIN, TIBC, IRON, RETICCTPCT in the last 72 hours. Urine analysis:    Component Value Date/Time   COLORURINE YELLOW 11/30/2010 1342   APPEARANCEUR CLEAR 11/30/2010 1342   LABSPEC 1.016 11/30/2010 1342   PHURINE 7.5 11/30/2010 1342   GLUCOSEU NEGATIVE 11/30/2010 1342   HGBUR NEGATIVE 11/30/2010 1342   BILIRUBINUR NEGATIVE 11/30/2010 1342   KETONESUR NEGATIVE 11/30/2010 1342   PROTEINUR NEGATIVE 11/30/2010 1342   UROBILINOGEN 0.2 11/30/2010 1342   NITRITE NEGATIVE 11/30/2010 1342   LEUKOCYTESUR NEGATIVE 11/30/2010 1342   Sepsis Labs: No results found for this or any previous visit (from the past 240 hour(s)).   Radiological Exams on Admission: Ct Angio Chest Pe W And/or Wo Contrast  Result  Date: 10/31/2016 CLINICAL DATA:  Acute onset of shortness of breath. Status post left shoulder surgery on October 18th. Leukocytosis. Initial encounter. EXAM: CT ANGIOGRAPHY CHEST WITH CONTRAST TECHNIQUE: Multidetector CT imaging of the chest was performed using the standard protocol during bolus administration of intravenous contrast. Multiplanar CT image reconstructions and MIPs were obtained to evaluate the vascular anatomy. CONTRAST:  100 mL of Isovue 370 IV contrast COMPARISON:  Chest radiograph performed 08/24/2014 FINDINGS: Cardiovascular: Evaluation for pulmonary embolus is suboptimal due to limitations in the timing of the contrast bolus.  However, there appears to be pulmonary embolus within the right main pulmonary artery, extending into all lobes of the right lung, and there is suspicion of pulmonary embolus at the left main pulmonary artery, extending into the left upper and lower lobes. The RV/LV ratio of 1.1 corresponds to right heart strain and at least submassive pulmonary embolus. The heart remains normal in size. The thoracic aorta is unremarkable. The great vessels are unremarkable in appearance. Mediastinum/Nodes: The mediastinum is otherwise unremarkable. No mediastinal lymphadenopathy is seen. No pericardial effusion is identified. A small hypodensity at the right thyroid lobe is likely benign, given its size. No axillary lymphadenopathy is seen. Lungs/Pleura: The lungs are clear bilaterally. No focal consolidation, pleural effusion or pneumothorax is seen. No masses are identified. Upper Abdomen: The visualized portions of the liver and spleen are unremarkable. Musculoskeletal: No acute osseous abnormalities are identified. The visualized musculature is unremarkable in appearance. Review of the MIP images confirms the above findings. IMPRESSION: 1. Suspect pulmonary embolus within the main pulmonary arteries bilaterally, extending to all lobes of both lungs. CT evidence of right heart strain  (RV/LV Ratio = 1.1) consistent with at least submassive (intermediate risk) PE. The presence of right heart strain has been associated with an increased risk of morbidity and mortality. Please activate Code PE by paging [phone]. 2. Lungs clear bilaterally. Critical Value/emergent results were called by telephone at the time of interpretation on 10/31/2016 at 1:34 am to [facility] Muthersbaugh PA, who verbally acknowledged these results. Electronically Signed   By: Roanna RaiderJeffery  Chang M.D.   On: 10/31/2016 01:37    EKG: Independently reviewed. Sinus rhythm  Assessment/Plan Pulmonary embolus: Acute. Patient presents with shortness of breath after recent shoulder surgery. CT angiogram reveals signs of bilateral pulmonary embolus with signs of right heart strain. Patient started on heparin drip per pharmacy. Patient hemodynamically stable at this time. - Admit to stepdown bed - Continuous pulse oximetry with nasal cannula oxygen as needed - Heparin per pharmacy  - Check echocardiogram in a.m.  Essential hypertension - Continue Coreg, enalapril, furosemide, and amlodipine  Chronic diastolic CHF: Last echocardiogram revealed an EF of55-60% with grade 2 diastolic dysfunction in 2014. Patient doesn't appear to be acutely fluid overloaded at this time. - Strict ins and outs  Chronic pain, osteoarthritis of the spine: Patient reports pain currently in pain management for chronic pain related to osteoarthritis of the spine.  - Continue with home pain regimen  Anemia: Hemoglobin 10.2 on admission which appears similar to previous check in July. - Recheck CBC in a.m.  Insomnia - Continue Elavil   Morbid obesity: BMI of 48.3  DVT prophylaxis: heparin Code Status: Full Family Communication: Discuss plan of care with the patient family present at bedside Disposition Plan: Likely discharge home in 2-3 days once medically stable. Consults called: None Admission status: inpatient  Clydie Braunondell A Smith  MD [facility] Pager [phone]   If 7PM-7AM, please contact night-coverage [email or web address] Password [email or web address]  10/31/2016, 2:31 AM

## 2016-10-31 NOTE — Progress Notes (Signed)
Patient seen and examined at bedside. Please see earlier admission note by Dr. Katrinka BlazingSmith. Patient admitted with new diagnosis of PE with suspected right heart strain, acute DVT in the proximal left posterior tibial vein. Patient was started on heparin drip, plan to change to Xarelto and pharmacy consulted for assistance with dosing. CBC and BMP in the morning.  Allison PrestoMAGICK-Henok Heacock, MD  [facility] Pager [phone]  If 7PM-7AM, please contact night-coverage [email or web address] Password [email or web address]

## 2016-10-31 NOTE — Progress Notes (Signed)
*  PRELIMINARY RESULTS* Vascular Ultrasound Bilateral lower extremity venous duplex has been completed.  Preliminary findings: Small segment of acute deep vein thrombosis noted in the proximal left posterior tibial vein, other visualized veins on this exam appear negative for thrombosis.  Somewhat difficult exam due to patient body habitus and penetration.  Chauncey FischerCharlotte C Chery Giusto 10/31/2016, 11:49 AM

## 2016-11-01 LAB — BASIC METABOLIC PANEL
ANION GAP: 7 (ref 5–15)
BUN: 15 mg/dL (ref 6–20)
CALCIUM: 9 mg/dL (ref 8.9–10.3)
CO2: 21 mmol/L — ABNORMAL LOW (ref 22–32)
Chloride: 107 mmol/L (ref 101–111)
Creatinine, Ser: 0.87 mg/dL (ref 0.44–1.00)
GFR calc Af Amer: >60 mL/min (ref >60)
GLUCOSE: 133 mg/dL — AB (ref 65–99)
Potassium: 3.9 mmol/L (ref 3.5–5.1)
Sodium: 135 mmol/L (ref 135–145)

## 2016-11-01 LAB — CBC
HCT: 31.7 % — ABNORMAL LOW (ref 36.0–46.0)
HEMATOCRIT: 32.9 % — AB (ref 36.0–46.0)
HEMOGLOBIN: 10.6 g/dL — AB (ref 12.0–15.0)
Hemoglobin: 10.3 g/dL — ABNORMAL LOW (ref 12.0–15.0)
MCH: 26.3 pg (ref 26.0–34.0)
MCH: 25.9 pg — ABNORMAL LOW (ref 26.0–34.0)
MCHC: 32.5 g/dL (ref 30.0–36.0)
MCHC: 32.2 g/dL (ref 30.0–36.0)
MCV: 80.9 fL (ref 78.0–100.0)
MCV: 80.4 fL (ref 78.0–100.0)
Platelets: 375 10*3/uL (ref 150–400)
Platelets: 405 10*3/uL — ABNORMAL HIGH (ref 150–400)
RBC: 3.92 MIL/uL (ref 3.87–5.11)
RBC: 4.09 MIL/uL (ref 3.87–5.11)
RDW: 16.5 % — AB (ref 11.5–15.5)
RDW: 16.4 % — ABNORMAL HIGH (ref 11.5–15.5)
WBC: 13.9 10*3/uL — ABNORMAL HIGH (ref 4.0–10.5)
WBC: 15.2 10*3/uL — AB (ref 4.0–10.5)

## 2016-11-01 MED ORDER — HYDRALAZINE HCL 20 MG/ML IJ SOLN: 5.0000 mg | INTRAMUSCULAR | Status: DC | PRN

## 2016-11-01 NOTE — Progress Notes (Signed)
This RN is taking over care of the pt and agrees with the previous RNs assessment.Will continue to monitor. 

## 2016-11-01 NOTE — Progress Notes (Signed)
Patient ID: Allison [name] Drum, female   DOB: 1980/09/05, 36 y.o.   MRN: [id number]    PROGRESS NOTE    Allison [name] Vanroekel  WUJ:[id number] DOB: 1980/09/05 DOA: 10/30/2016  PCP: Jackie Plumsei-Bonsu, George, MD   Brief Narrative:  36 y.o. female with medical history significant of NICM, diastolic CHF with EF noted to be 55-60% with grade 2 diastolic dysfunction in 2014, osteoarthritis of the spine, chronic pain, morbid obesity; who presents with complaints of shortness of breath.   Assessment & Plan:   Principal Problem:   Pulmonary embolus (HCC) - in the setting of recent shoulder surgery  - also with acute DVT in the proximal left posterior tibial vein.  - cont Xarelto  - CBC in AM  Active Problems:   OBESITY, MORBID - Body mass index is 48.35 kg/[name].    HTN (hypertension), essential  - will add hydralazine as needed    Chronic diastolic CHF (congestive heart failure) (HCC) - no signs of volume overload     Normocytic anemia - no signs of active bleeding  DVT prophylaxis: Xarelto  Code Status: Full  Family Communication: Patient at bedside  Disposition Plan: home in am   Consultants:   None  Procedures:   LE doppler   Antimicrobials:   None  Subjective: No events overnight.   Objective: Vitals:   11/01/16 0800 11/01/16 0826 11/01/16 1000 11/01/16 1001  BP: 129/63 129/63 (!) 156/103 (!) 156/103  Pulse: 83  (!) 113   Resp: (!) 22  16   Temp: 98.8 F (37.1 C)     TempSrc: Oral     SpO2: 98%  100%   Weight:      Height:        Intake/Output Summary (Last 24 hours) at 11/01/16 1124 Last data filed at 11/01/16 1002  Gross per 24 hour  Intake              840 ml  Output              500 ml  Net              340 ml   Filed Weights   10/30/16 2047 10/31/16 0348  Weight: 134.3 kg (296 lb) 131.8 kg (290 lb 9.1 oz)    Examination:  General exam: Appears calm and comfortable  Respiratory system: Clear to auscultation. Respiratory effort normal. Cardiovascular  system: S1 & S2 heard, RRR. No JVD, murmurs, rubs, gallops or clicks. No pedal edema. Gastrointestinal system: Abdomen is nondistended, soft and nontender. No organomegaly or masses felt. Normal bowel sounds heard. Central nervous system: Alert and oriented. No focal neurological deficits. Extremities: Symmetric 5 x 5 power.  Data Reviewed: I have personally reviewed following labs and imaging studies  CBC:  Recent Labs Lab 10/30/16 2259 10/31/16 0400 11/01/16 0403  WBC 14.8* 15.1* 13.9*  NEUTROABS 8.5*  --   --   HGB 10.2* 10.4* 10.3*  HCT 31.4* 31.6* 31.7*  MCV 80.1 80.6 80.9  PLT 361 363 375   Basic Metabolic Panel:  Recent Labs Lab 10/30/16 2259 10/31/16 0400 11/01/16 0403  NA 136 136 135  K 4.3 3.6 3.9  CL 106 109 107  CO2 21* 20* 21*  GLUCOSE 92 97 133*  BUN 16 15 15   CREATININE 0.85 0.83 0.87  CALCIUM 8.5* 8.5* 9.0   Urine analysis:    Component Value Date/Time   COLORURINE YELLOW 11/30/2010 1342   APPEARANCEUR CLEAR 11/30/2010 1342   LABSPEC 1.016 11/30/2010  1342   PHURINE 7.5 11/30/2010 1342   GLUCOSEU NEGATIVE 11/30/2010 1342   HGBUR NEGATIVE 11/30/2010 1342   BILIRUBINUR NEGATIVE 11/30/2010 1342   KETONESUR NEGATIVE 11/30/2010 1342   PROTEINUR NEGATIVE 11/30/2010 1342   UROBILINOGEN 0.2 11/30/2010 1342   NITRITE NEGATIVE 11/30/2010 1342   LEUKOCYTESUR NEGATIVE 11/30/2010 1342   Recent Results (from the past 240 hour(s))  MRSA PCR Screening     Status: None   Collection Time: 10/31/16  3:45 AM  Result Value Ref Range Status   MRSA by PCR NEGATIVE NEGATIVE Final    Comment:        The GeneXpert MRSA Assay (FDA approved for NASAL specimens only), is one component of a comprehensive MRSA colonization surveillance program. It is not intended to diagnose MRSA infection nor to guide or monitor treatment for MRSA infections.       Radiology Studies: Ct Angio Chest Pe W And/or Wo Contrast  Result Date: 10/31/2016 CLINICAL DATA:  Acute  onset of shortness of breath. Status post left shoulder surgery on October 18th. Leukocytosis. Initial encounter. EXAM: CT ANGIOGRAPHY CHEST WITH CONTRAST TECHNIQUE: Multidetector CT imaging of the chest was performed using the standard protocol during bolus administration of intravenous contrast. Multiplanar CT image reconstructions and MIPs were obtained to evaluate the vascular anatomy. CONTRAST:  100 mL of Isovue 370 IV contrast COMPARISON:  Chest radiograph performed 08/24/2014 FINDINGS: Cardiovascular: Evaluation for pulmonary embolus is suboptimal due to limitations in the timing of the contrast bolus. However, there appears to be pulmonary embolus within the right main pulmonary artery, extending into all lobes of the right lung, and there is suspicion of pulmonary embolus at the left main pulmonary artery, extending into the left upper and lower lobes. The RV/LV ratio of 1.1 corresponds to right heart strain and at least submassive pulmonary embolus. The heart remains normal in size. The thoracic aorta is unremarkable. The great vessels are unremarkable in appearance. Mediastinum/Nodes: The mediastinum is otherwise unremarkable. No mediastinal lymphadenopathy is seen. No pericardial effusion is identified. A small hypodensity at the right thyroid lobe is likely benign, given its size. No axillary lymphadenopathy is seen. Lungs/Pleura: The lungs are clear bilaterally. No focal consolidation, pleural effusion or pneumothorax is seen. No masses are identified. Upper Abdomen: The visualized portions of the liver and spleen are unremarkable. Musculoskeletal: No acute osseous abnormalities are identified. The visualized musculature is unremarkable in appearance. Review of the MIP images confirms the above findings. IMPRESSION: 1. Suspect pulmonary embolus within the main pulmonary arteries bilaterally, extending to all lobes of both lungs. CT evidence of right heart strain (RV/LV Ratio = 1.1) consistent with at  least submassive (intermediate risk) PE. The presence of right heart strain has been associated with an increased risk of morbidity and mortality. Please activate Code PE by paging [phone]. 2. Lungs clear bilaterally. Critical Value/emergent results were called by telephone at the time of interpretation on 10/31/2016 at 1:34 am to [facility] PA, who verbally acknowledged these results. Electronically Signed   By: Roanna Raider [name].D.   On: 10/31/2016 01:37   Scheduled Meds: . amLODipine  5 mg Oral QPC breakfast  . enalapril  10 mg Oral BID  . furosemide  40 mg Oral Q breakfast  . gabapentin  300 mg Oral TID  . Influenza vac split quadrivalent PF  0.5 mL Intramuscular Tomorrow-1000  . mouth rinse  15 mL Mouth Rinse BID  . oxyCODONE-acetaminophen  1 tablet Oral Q6H   And  . oxyCODONE  5 mg Oral Q6H  . pantoprazole  40 mg Oral Daily  . Rivaroxaban  15 mg Oral BID WC  . topiramate  100 mg Oral BID   Continuous Infusions:   LOS: 1 day   Time spent: 25 minutes  Debbora Presto, MD [facility] Pager [phone]  If 7PM-7AM, please contact night-coverage [email or web address] Password [email or web address] 11/01/2016, 11:24 AM

## 2016-11-02 LAB — CBC
HCT: 32.1 % — ABNORMAL LOW (ref 36.0–46.0)
Hemoglobin: 10.3 g/dL — ABNORMAL LOW (ref 12.0–15.0)
MCH: 26 pg (ref 26.0–34.0)
MCHC: 32.1 g/dL (ref 30.0–36.0)
MCV: 81.1 fL (ref 78.0–100.0)
PLATELETS: 388 10*3/uL (ref 150–400)
RBC: 3.96 MIL/uL (ref 3.87–5.11)
RDW: 16.2 % — ABNORMAL HIGH (ref 11.5–15.5)
WBC: 11 10*3/uL — AB (ref 4.0–10.5)

## 2016-11-02 LAB — BASIC METABOLIC PANEL
Anion gap: 8 (ref 5–15)
BUN: 14 mg/dL (ref 6–20)
CO2: 21 mmol/L — ABNORMAL LOW (ref 22–32)
CREATININE: 0.8 mg/dL (ref 0.44–1.00)
Calcium: 9 mg/dL (ref 8.9–10.3)
Chloride: 106 mmol/L (ref 101–111)
Glucose, Bld: 108 mg/dL — ABNORMAL HIGH (ref 65–99)
Potassium: 3.8 mmol/L (ref 3.5–5.1)
SODIUM: 135 mmol/L (ref 135–145)

## 2016-11-02 MED ORDER — RIVAROXABAN (XARELTO) VTE STARTER PACK (15 & 20 MG): ORAL_TABLET | ORAL | 0 refills | Status: DC

## 2016-11-02 NOTE — Care Management Note (Signed)
Case Management Note  Patient Details  Name: Allison [name] M Stlouis MRN: [id number] Date of Birth: February 15, 1980  Subjective/Objective:                    Action/Plan:d/c home.   Expected Discharge Date:  11/02/16               Expected Discharge Plan:  Home/Self Care  In-House Referral:     Discharge planning Services  CM Consult  Post Acute Care Choice:    Choice offered to:     DME Arranged:    DME Agency:     HH Arranged:    HH Agency:     Status of Service:  Completed, signed off  If discussed at [employer] Length of Stay Meetings, dates discussed:    Additional Comments:  Lanier ClamMahabir, Zilla Shartzer, RN 11/02/2016, 12:21 PM

## 2016-11-02 NOTE — Discharge Summary (Addendum)
Physician Discharge Summary  Tomma RakersSeamon M Chartrand ZOX:[id number] DOB: 03/31/1980 DOA: 10/30/2016  PCP: Jackie Plumsei-Bonsu, George, MD  Admit date: 10/30/2016 Discharge date: 11/02/2016  Recommendations for Outpatient Follow-up:  1. Pt will need to follow up with PCP in 1 week post discharge 2. Please obtain BMP to evaluate electrolytes and kidney function 3. Please also check CBC to evaluate Hg and Hct levels 4. Pt discharged on Xarelto, made aware of changes in frequency as outlined below   Discharge Diagnoses:  Principal Problem:   Pulmonary embolus (HCC) Active Problems:   OBESITY, MORBID   HTN (hypertension)   Chronic diastolic CHF (congestive heart failure) (HCC)   Normocytic anemia  Discharge Condition: Stable  Diet recommendation: Heart healthy diet discussed in details   History of present illness:  36 y.o.femalewith medical history significant of NICM, diastolic CHF with EF noted to be 55-60% with grade 2 diastolic dysfunction in 2014, osteoarthritis of the spine, chronic pain, morbid obesity; who presents with complaints of shortness of breath.   Assessment & Plan:   Principal Problem:   Pulmonary embolus (HCC), unable to determined exact cause but possibly related to recent surgery  - also with acute DVT in the proximal left posterior tibial vein.  - continue Xarelto   Active Problems:   OBESITY, MORBID - Body mass index is 48.35 kg/m.    HTN (hypertension), essential  - continue home medical regimen     Chronic diastolic CHF (congestive heart failure) (HCC) - no signs of volume overload  - continue lasix per home medical regimen     Normocytic anemia - no signs of active bleeding - has some small amount of vaginal bleeding but this is now resolved per pt   DVT prophylaxis: Xarelto  Code Status: Full  Family Communication: Patient at bedside  Disposition Plan: home   Consultants:   None  Procedures:   LE doppler   Antimicrobials:    None  Procedures/Studies: Ct Angio Chest Pe W And/or Wo Contrast  Result Date: 10/31/2016 CLINICAL DATA:  Acute onset of shortness of breath. Status post left shoulder surgery on October 18th. Leukocytosis. Initial encounter. EXAM: CT ANGIOGRAPHY CHEST WITH CONTRAST TECHNIQUE: Multidetector CT imaging of the chest was performed using the standard protocol during bolus administration of intravenous contrast. Multiplanar CT image reconstructions and MIPs were obtained to evaluate the vascular anatomy. CONTRAST:  100 mL of Isovue 370 IV contrast COMPARISON:  Chest radiograph performed 08/24/2014 FINDINGS: Cardiovascular: Evaluation for pulmonary embolus is suboptimal due to limitations in the timing of the contrast bolus. However, there appears to be pulmonary embolus within the right main pulmonary artery, extending into all lobes of the right lung, and there is suspicion of pulmonary embolus at the left main pulmonary artery, extending into the left upper and lower lobes. The RV/LV ratio of 1.1 corresponds to right heart strain and at least submassive pulmonary embolus. The heart remains normal in size. The thoracic aorta is unremarkable. The great vessels are unremarkable in appearance. Mediastinum/Nodes: The mediastinum is otherwise unremarkable. No mediastinal lymphadenopathy is seen. No pericardial effusion is identified. A small hypodensity at the right thyroid lobe is likely benign, given its size. No axillary lymphadenopathy is seen. Lungs/Pleura: The lungs are clear bilaterally. No focal consolidation, pleural effusion or pneumothorax is seen. No masses are identified. Upper Abdomen: The visualized portions of the liver and spleen are unremarkable. Musculoskeletal: No acute osseous abnormalities are identified. The visualized musculature is unremarkable in appearance. Review of the MIP images confirms the  above findings. IMPRESSION: 1. Suspect pulmonary embolus within the main pulmonary arteries  bilaterally, extending to all lobes of both lungs. CT evidence of right heart strain (RV/LV Ratio = 1.1) consistent with at least submassive (intermediate risk) PE. The presence of right heart strain has been associated with an increased risk of morbidity and mortality. Please activate Code PE by paging [phone]. 2. Lungs clear bilaterally. Critical Value/emergent results were called by telephone at the time of interpretation on 10/31/2016 at 1:34 am to [facility], who verbally acknowledged these results. Electronically Signed   By: Roanna Raider M.D.   On: 10/31/2016 01:37     Discharge Exam: Vitals:   11/01/16 2159 11/02/16 0610  BP: 109/66 105/65  Pulse: (!) 102 92  Resp: 17 20  Temp: 99.6 F (37.6 C) 98 F (36.7 C)  SpO2: 100% 93%   Vitals:   11/01/16 1400 11/01/16 1533 11/01/16 2159 11/02/16 0610  BP: 136/74 (!) 145/71 109/66 105/65  Pulse: (!) 106 (!) 119 (!) 102 92  Resp: 18  17 20   Temp:   99.6 F (37.6 C) 98 F (36.7 C)  TempSrc:   Oral Oral  SpO2: 98% 99% 100% 93%  Weight:      Height:        General: Pt is alert, follows commands appropriately, not in acute distress Cardiovascular: Regular rate and rhythm, S1/S2 +, no murmurs, no rubs, no gallops Respiratory: Clear to auscultation bilaterally, no wheezing, no crackles, no rhonchi Abdominal: Soft, non tender, non distended, bowel sounds +, no guarding Extremities: no cyanosis, pulses palpable bilaterally DP and PT Neuro: Grossly nonfocal  Discharge Instructions  Discharge Instructions    Diet - low sodium heart healthy    Complete by:  As directed    Increase activity slowly    Complete by:  As directed      Allergies as of 11/02/2016      Reactions   Cymbalta [duloxetine Hcl] Other (See Comments)   Paranoia/nauseous/anxious feeling.   Reglan [metoclopramide] Anxiety      Medication List    STOP taking these medications   cephALEXin 500 MG capsule Commonly known as:  KEFLEX      TAKE these medications   amitriptyline 25 MG tablet Commonly known as:  ELAVIL Take 25 mg by mouth at bedtime.   amLODipine 5 MG tablet Commonly known as:  NORVASC Take 5 mg by mouth daily after breakfast.   baclofen 10 MG tablet Commonly known as:  LIORESAL Take 10 mg by mouth every 8 (eight) hours as needed for muscle spasms.   carvedilol 25 MG tablet Commonly known as:  COREG Take 1 tablet (25 mg total) by mouth 2 (two) times daily with a meal.   [employer] D3 1000 units capsule Generic drug:  Cholecalciferol Take 1,000 Units by mouth daily after breakfast.   cyclobenzaprine 5 MG tablet Commonly known as:  FLEXERIL Take 5 mg by mouth 3 (three) times daily.   DENTA 5000 PLUS 1.1 % Crea dental cream Generic drug:  sodium fluoride Take 1 application by mouth daily.   diclofenac 75 MG EC tablet Commonly known as:  VOLTAREN Take 75 mg by mouth 2 (two) times daily.   enalapril 10 MG tablet Commonly known as:  VASOTEC Take 10 mg by mouth 2 (two) times daily.   furosemide 40 MG tablet Commonly known as:  LASIX Take 40 mg by mouth daily with breakfast.   gabapentin 300 MG capsule Commonly known as:  NEURONTIN  Take 300 mg by mouth 3 (three) times daily.   Melatonin 3 MG Caps Take 1 capsule (3 mg total) by mouth at bedtime.   omeprazole 20 MG capsule Commonly known as:  PRILOSEC Take 20 mg by mouth daily before breakfast.   oxyCODONE 5 MG immediate release tablet Commonly known as:  Oxy IR/ROXICODONE Take 5 mg by mouth every 4 (four) hours as needed for moderate pain or severe pain. for pain   oxyCODONE-acetaminophen 10-325 MG tablet Commonly known as:  PERCOCET Take 1 tablet by mouth every 6 (six) hours.   Rivaroxaban 15 & 20 MG Tbpk Take as directed on package: Start with one 15mg  tablet by mouth twice a day with food. On Day 22, switch to one 20mg  tablet once a day with food.  Please note that first dose of Rivaroxaban was given on 10/31/2016   SUMAtriptan 50 MG  tablet Commonly known as:  IMITREX Take 50 mg by mouth daily as needed for migraine. May repeat in 2 hours if headache persists or recurs.   topiramate 100 MG tablet Commonly known as:  TOPAMAX Take 100 mg by mouth 2 (two) times daily.        Follow-up Information    Jackie Plum, MD Follow up.   Specialty:  Internal Medicine Contact information: [address] [place] [address] [phone]        Dorothea Ogle, MD Follow up.   Specialty:  Internal Medicine Why:  call my cell phone with questions [phone] Contact information: [address] [place] [address] [phone]            The results of significant diagnostics from this hospitalization (including imaging, microbiology, ancillary and laboratory) are listed below for reference.     Microbiology: Recent Results (from the past 240 hour(s))  MRSA PCR Screening     Status: None   Collection Time: 10/31/16  3:45 AM  Result Value Ref Range Status   MRSA by PCR NEGATIVE NEGATIVE Final    Comment:        The GeneXpert MRSA Assay (FDA approved for NASAL specimens only), is one component of a comprehensive MRSA colonization surveillance program. It is not intended to diagnose MRSA infection nor to guide or monitor treatment for MRSA infections.      Labs: Basic Metabolic Panel:  Recent Labs Lab 10/30/16 2259 10/31/16 0400 11/01/16 0403 11/02/16 0457  NA 136 136 135 135  K 4.3 3.6 3.9 3.8  CL 106 109 107 106  CO2 21* 20* 21* 21*  GLUCOSE 92 97 133* 108*  BUN 16 15 15 14   CREATININE 0.85 0.83 0.87 0.80  CALCIUM 8.5* 8.5* 9.0 9.0   CBC:  Recent Labs Lab 10/30/16 2259 10/31/16 0400 11/01/16 0403 11/01/16 1857 11/02/16 0457  WBC 14.8* 15.1* 13.9* 15.2* 11.0*  NEUTROABS 8.5*  --   --   --   --   HGB 10.2* 10.4* 10.3* 10.6* 10.3*  HCT 31.4* 31.6* 31.7* 32.9* 32.1*  MCV 80.1 80.6 80.9 80.4 81.1  PLT 361 363 375 405* 388   BNP (last 3  results)  Recent Labs  10/30/16 2300  BNP 15.6   SIGNED: Time coordinating discharge: 60 minutes  Debbora Presto, MD  [facility] 11/02/2016, 12:04 PM Pager [phone]  If 7PM-7AM, please contact night-coverage [email or web address] Password [email or web address]

## 2016-11-02 NOTE — Progress Notes (Signed)
Subjective:    Patient admitted to medicine fro bilateral PE's .  S/p Left shoulder scope, SAD,DCR, and debridement. She was doing well until Tuesday when she started to have SOB. [facility] She was then placed on anticoagulation for bilateral PE's and DVT. She reports she is felling better today. Planning for D/c home on Xarelto.  Denies CP or SOB.   Objective: Vital signs in last 24 hours: Temp:  [98 F (36.7 C)-99.6 F (37.6 C)] 98 F (36.7 C) (10/26 0610) Pulse Rate:  [92-119] 92 (10/26 0610) Resp:  [17-20] 20 (10/26 0610) BP: (105-145)/(65-74) 105/65 (10/26 0610) SpO2:  [93 %-100 %] 93 % (10/26 0610)  Intake/Output from previous day: 10/25 0701 - 10/26 0700 In: 1320 [P.O.:1320] Out: 1200 [Urine:1200] Intake/Output this shift: No intake/output data recorded.   Recent Labs  10/30/16 2259 10/31/16 0400 11/01/16 0403 11/01/16 1857 11/02/16 0457  HGB 10.2* 10.4* 10.3* 10.6* 10.3*    Recent Labs  11/01/16 1857 11/02/16 0457  WBC 15.2* 11.0*  RBC 4.09 3.96  HCT 32.9* 32.1*  PLT 405* 388    Recent Labs  11/01/16 0403 11/02/16 0457  NA 135 135  K 3.9 3.8  CL 107 106  CO2 21* 21*  BUN 15 14  CREATININE 0.87 0.80  GLUCOSE 133* 108*  CALCIUM 9.0 9.0   No results for input(s): LABPT, INR in the last 72 hours.  Alert and oriented x3.  L shoulder dressing C/D/I. Dressing changed. Sutures removed and were intact. No signs of infection or compartment syndrome. LUE grossly neurovascularly intact. Sling in place. Grips strong and equal. Sensation intact.    Assessment/Plan:    S/p Left shoulder arthroscopy: sutures removed and dressing changed PT to start the end of next week F/u in office next Friday Sling for comfort HEP with gentle ROM  Bilateral PE's: LE DVT: On Xarelto Admitted to Medicine F/u arranged Plan to D/c home today    Markham JordanSTILWELL, Mc Hollen L 11/02/2016, 12:30 PM

## 2016-11-02 NOTE — Progress Notes (Signed)
D/c instructions given w/ verbal understanding.Awaiting ride. 

## 2016-11-02 NOTE — Care Management Note (Signed)
Case Management Note  Patient Details  Name: Tomma RakersSeamon M Grudzien MRN: [id number] Date of Birth: 1980-07-23  Subjective/Objective: Transfer from SDU. 36 y/o f admitted w/PE. From home.                   Action/Plan:d/c plan home.   Expected Discharge Date:                  Expected Discharge Plan:  Home/Self Care  In-House Referral:     Discharge planning Services  CM Consult  Post Acute Care Choice:    Choice offered to:     DME Arranged:    DME Agency:     HH Arranged:    HH Agency:     Status of Service:  In process, will continue to follow  If discussed at Long Length of Stay Meetings, dates discussed:    Additional Comments:  Lanier ClamMahabir, Jerryl Holzhauer, RN 11/02/2016, 11:38 AM

## 2016-11-05 ENCOUNTER — Ambulatory Visit: Payer: Medicaid Other | Admitting: Physical Therapy

## 2016-11-21 ENCOUNTER — Ambulatory Visit: Payer: Medicaid Other | Attending: Specialist | Admitting: Physical Therapy

## 2016-11-21 ENCOUNTER — Encounter: Payer: Self-pay | Admitting: Physical Therapy

## 2016-11-21 DIAGNOSIS — M25512 Pain in left shoulder: Secondary | ICD-10-CM | POA: Diagnosis present

## 2016-11-21 DIAGNOSIS — M25612 Stiffness of left shoulder, not elsewhere classified: Secondary | ICD-10-CM | POA: Diagnosis present

## 2016-11-21 DIAGNOSIS — G8929 Other chronic pain: Secondary | ICD-10-CM | POA: Diagnosis present

## 2016-11-21 DIAGNOSIS — M6281 Muscle weakness (generalized): Secondary | ICD-10-CM | POA: Insufficient documentation

## 2016-11-21 NOTE — Therapy (Signed)
[facility] [address] [place], [place], [address] Phone: [phone]   Fax:  [phone]  Physical Therapy Evaluation  Patient Details  Name: Allison [name] MRN: [id number] Date of Birth: 1980/01/22 Referring Provider: R. Valma CavaAndrew Collins, MD   Encounter Date: 11/21/2016  PT End of Session - 11/21/16 1516    Visit Number  1    Number of Visits  12    Date for PT Re-Evaluation  01/02/17 Simultaneous filing. User may not have seen previous data.    Authorization Type  Medicaid (reassess after 4th visit for re-authorization) Simultaneous filing. User may not have seen previous data.    PT Start Time  1405    PT Stop Time  1455    PT Time Calculation (min)  50 min    Activity Tolerance  Patient tolerated treatment well    Behavior During Therapy  WFL for tasks assessed/performed       Past Medical History:  Diagnosis Date  . Arthritis   . 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 Carpal tunnel syndrome of right wrist   . Chronic back pain   . Gallstones   . GERD (gastroesophageal reflux disease)    takes Omeprazole daily  . History of migraine   . Migraines   . Morbid obesity (HCC)   . Nausea    takes Phenergan as needed  . Osteoarthritis    takes Relafen daily  . PIH (pregnancy induced hypertension)    takes Coreg,Enalapril,and Amlodipine daily    Past Surgical History:  Procedure Laterality Date  . CESAREAN SECTION  2011  . TUBAL LIGATION  2011    There were no vitals filed for this visit.   Subjective Assessment - 11/21/16 1407    Subjective  Pt. is 36 yo female s/p left shoulder scope, SAD, and DCR performed on 10/25/16. Pt. 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 Pt. reports onset of PT was delayed by hospitilization due to blood clots. She currently reports 7/10 pain in L shoulder that remains localized and has steadily improved  since surgery. Pt. reports she is "slowly getting my movement back" in LUE.    Pertinent History  hospitalized for blood clots, history of migraines     Limitations  Lifting    How long can you sit comfortably?  unlimited     How long can you stand comfortably?  unlimited    How long can you walk comfortably?  unlimited     Diagnostic tests  CAT scan for blood clots     Patient Stated Goals  "be able to move"    Currently in Pain?  Yes took pain medication before leaving home    Pain Score  7  worst 8    Pain Location  Shoulder    Pain Orientation  Left    Pain Descriptors / Indicators  Sharp;Throbbing    Pain Type  Surgical pain    Pain Onset  1 to 4 weeks ago    Pain Frequency  Constant    Aggravating Factors   movement past normal range, lifting, sleeping on side     Pain Relieving Factors  ice, medication        [facility] PT Assessment - 11/21/16 1354      Assessment   Medical Diagnosis  Left shoulder scope/SAD/DCR/labral tear     Referring Provider  R. Valma CavaAndrew Collins, MD  Onset Date/Surgical Date  10/25/16    Hand Dominance  Right    Next MD Visit  Already seen told to come back PRN    Prior Therapy  Yes       Precautions   Precautions  None      Restrictions   Weight Bearing Restrictions  No      Balance Screen   Has the patient fallen in the past 6 months  No    Has the patient had a decrease in activity level because of a fear of falling?   No    Is the patient reluctant to leave their home because of a fear of falling?   No      Home Environment   Living Environment  Private residence    Living Arrangements  Children    Available Help at Discharge  Family    Type of Home  House    Home Access  Stairs to enter    Entrance Stairs-Number of Steps  4    Entrance Stairs-Rails  Can reach both    Home Layout  One level    Home Equipment  None      Prior Function   Level of Independence  Independent    Vocation  Unemployed      Cognition   Overall Cognitive  Status  Within Functional Limits for tasks assessed      Posture/Postural Control   Posture/Postural Control  Postural limitations    Postural Limitations  Rounded Shoulders;Forward head    Posture Comments  pt. had noted shoulder hiking with motion bilaterally      AROM   AROM Assessment Site  Shoulder    Right/Left Shoulder  Right;Left    Right Shoulder Extension  70 Degrees    Right Shoulder Flexion  150 Degrees    Right Shoulder ABduction  124 Degrees    Right Shoulder Internal Rotation  80 Degrees    Right Shoulder External Rotation  79 Degrees    Left Shoulder Extension  39 Degrees pain at end range     Left Shoulder Flexion  95 Degrees    Left Shoulder ABduction  71 Degrees    Left Shoulder Internal Rotation  72 Degrees    Left Shoulder External Rotation  8 Degrees further with compensation       PROM   PROM Assessment Site  Shoulder    Right/Left Shoulder  Left    Left Shoulder Flexion  115 Degrees in supine    Left Shoulder ABduction  90 Degrees supine; with pain       Strength   Strength Assessment Site  Shoulder    Right/Left Shoulder  Right;Left    Right Shoulder Flexion  4-/5    Right Shoulder Extension  4/5    Right Shoulder ABduction  4-/5    Right Shoulder Internal Rotation  4-/5    Right Shoulder External Rotation  4-/5    Left Shoulder Flexion  3-/5    Left Shoulder Extension  3+/5    Left Shoulder ABduction  3-/5    Left Shoulder Internal Rotation  4-/5    Left Shoulder External Rotation  3-/5      Palpation   Palpation comment  TTP over L subacromial space, proximal long head of left biceps tendon, left distal clavicle; palpable muscle nodules in left upper trapezius and teres minor        Objective measurements completed on examination: See above findings.  PT Education - 11/21/16 1515    Education provided  Yes    Education Details  pt. education on HEP and rationale, POC     Person(s) Educated  Patient    Methods   Explanation;Demonstration;Tactile cues;Verbal cues;Handout    Comprehension  Verbalized understanding;Returned demonstration;Tactile cues required       PT Short Term Goals - 11/21/16 1533      PT SHORT TERM GOAL #1   Title  Pt. will be I with intial HEP    Baseline  no previous HEP    Time  3    Period  Weeks    Status  New    Target Date  12/12/16      PT SHORT TERM GOAL #2   Title  Pt. will demonstrate decreased shoulder hiking and proper posture with motion to reduce pain.    Baseline  pt. demonstrates shoulder hiking during shoulder ROM and exercise     Time  3    Period  Weeks    Status  New    Target Date  12/12/16       PT Long Term Goals - 11/21/16 1536      PT LONG TERM GOAL #1   Title  Pt. will increase overall shoulder strength in left to >/= 4/5 to allow for better lifting required for ADL's.     Baseline  shoulder strength 3-/3+/5     Time  6    Period  Weeks    Status  New    Target Date  01/30/17      PT LONG TERM GOAL #2   Title  Pt. will increase shoulder flexion/abuction/ER >/= 25 degrees with </= 2/10 pain to allow for proper functional use of LUE for activities such as grooming.    Baseline  shoulder flexion and abduction, ER A/PROM limited with pain at end-range     Time  6    Period  Weeks    Status  New    Target Date  01/30/17      PT LONG TERM GOAL #3   Title  Pt. will be independent with all prescribed HEP as of final visit.     Baseline  no previous HEP    Time  6    Period  Weeks    Status  New    Target Date  01/30/17      PT LONG TERM GOAL #4   Title  Pt will be able to lift 10+ lbs. overhead to simulate reaching or potential work related activity.     Baseline  pt. unable to move L arm past 90 degrees of shoulder flexion, pain with lifting     Time  6    Period  Weeks    Status  New    Target Date  01/30/17        Plan - 11/21/16 1519    Clinical Impression Statement  Pt. is 36 yo female s/p left shoulder scope, DCR, and  SAD performed 10/25/16. Pt. presents to OPPT with post-op pain and weakness as expected. Pt. was limited in A/PROM in left shoulder with pain at end-range during movements. Pt. was TTP over left proximal tendon of the long head of the bicep and over the left distal clavicle. Pt. is expected to improve as post-op pain subsides and will benefit from physical therapy address deficits in strength and ROM for return to prior level of function.    Clinical Presentation  Stable  Clinical Decision Making  Low    Rehab Potential  Good    PT Frequency  1x / week after Medicaid re-authorization 2x/week for 6 weeks    PT Duration  3 weeks    PT Treatment/Interventions  ADLs/Self Care Home Management;Electrical Stimulation;Iontophoresis 4mg /ml Dexamethasone;Moist Heat;Cryotherapy;Therapeutic activities;Therapeutic exercise;Manual techniques;Passive range of motion;Dry needling;Taping    PT Next Visit Plan  reassess HEP, arm ergometer, AAROM, joint mobiliztion, soft tissue PRN, isometric strengthening, assess grip strength     PT Home Exercise Plan  wand flexion (supine); wand abduction (seated); upper trap stretch, theraband rows     Consulted and Agree with Plan of Care  Patient       Patient will benefit from skilled therapeutic intervention in order to improve the following deficits and impairments:  Pain, Hypomobility, Decreased strength, Decreased range of motion, Increased muscle spasms  Visit Diagnosis: Chronic left shoulder pain  Stiffness of left shoulder, not elsewhere classified  Muscle weakness (generalized)     Problem List Patient Active Problem List   Diagnosis Date Noted  . Pulmonary embolus (HCC) 10/31/2016  . Chronic diastolic CHF (congestive heart failure) (HCC) 10/31/2016  . Normocytic anemia 10/31/2016  . Congestive dilated cardiomyopathy (HCC) 10/10/2016  . Preop cardiovascular exam 10/10/2016  . Vaginal itching 04/04/2016  . Dysuria 04/04/2016  . Mild dysplasia of cervix  11/24/2012  . Papanicolaou smear of cervix with low grade squamous intraepithelial lesion (LGSIL) 10/21/2012  . HTN (hypertension) 12/10/2011  . Fatigue 03/04/2010  . OBESITY, MORBID 02/10/2010  . ESSENTIAL HYPERTENSION, BENIGN 07/20/2009  . Secondary cardiomyopathy, unspecified 07/20/2009  . EDEMA 07/20/2009    Maryruth HancockOladimeji Arnulfo Batson, SPT 11/21/16 5:40 PM   [facility] [address] [place], [place], [address] Phone: [phone]   Fax:  [phone]  Name: Allison [name] MRN: [id number] Date of Birth: 08-10-1980

## 2016-12-04 ENCOUNTER — Encounter: Payer: Self-pay | Admitting: Physical Therapy

## 2016-12-04 ENCOUNTER — Ambulatory Visit: Payer: Medicaid Other | Admitting: Physical Therapy

## 2016-12-04 DIAGNOSIS — M25612 Stiffness of left shoulder, not elsewhere classified: Secondary | ICD-10-CM

## 2016-12-04 DIAGNOSIS — M25512 Pain in left shoulder: Secondary | ICD-10-CM | POA: Diagnosis not present

## 2016-12-04 DIAGNOSIS — M6281 Muscle weakness (generalized): Secondary | ICD-10-CM

## 2016-12-04 DIAGNOSIS — G8929 Other chronic pain: Secondary | ICD-10-CM

## 2016-12-04 NOTE — Therapy (Signed)
[facility] [address] [place], [place], [address] Phone: [phone]   Fax:  [phone]  Physical Therapy Treatment  Patient Details  Name: Allison [name] MRN: [id number] Date of Birth: [birth year] Referring Provider: R. Valma CavaAndrew Collins, MD   Encounter Date: 12/04/2016  PT End of Session - 12/04/16 1048    Visit Number  2    Number of Visits  12    Date for PT Re-Evaluation  01/02/17    Authorization Type  Medicaid (reassess after 4th visit for re-authorization)    PT Start Time  0933    PT Stop Time  1015    PT Time Calculation (min)  42 min    Activity Tolerance  Patient tolerated treatment well    Behavior During Therapy  [facility] for [facility] assessed/performed       Past Medical History:  Diagnosis Date  . Arthritis   . 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 Carpal tunnel syndrome of right wrist   . Chronic back pain   . Gallstones   . GERD (gastroesophageal reflux disease)    takes Omeprazole daily  . History of migraine   . Migraines   . Morbid obesity (HCC)   . Nausea    takes Phenergan as needed  . Osteoarthritis    takes Relafen daily  . PIH (pregnancy induced hypertension)    takes Coreg,Enalapril,and Amlodipine daily    Past Surgical History:  Procedure Laterality Date  . CARPAL TUNNEL RELEASE Right 08/02/2016   Procedure: CARPAL TUNNEL RELEASE RIGHT;  Surgeon: Coletta Memosabbell, Kyle, MD;  [facility];  Service: Neurosurgery;  Laterality: Right;  right  . CESAREAN SECTION  2011  . CHOLECYSTECTOMY N/A 01/23/2013   Procedure: LAPAROSCOPIC CHOLECYSTECTOMY;  Surgeon: Atilano InaEric M Wilson, MD;  [facility];  Service: General;  Laterality: N/A;  . TUBAL LIGATION  2011    There were no vitals filed for this visit.  Subjective Assessment - 12/04/16 0935    Subjective  Pt. reports left shoulder "still feels tight" and that there has been minimal improvement in pain.    Pertinent History  --    Limitations   Lifting    How long can you sit comfortably?  --    How long can you stand comfortably?  --    How long can you walk comfortably?  --    Diagnostic tests  --    Patient Stated Goals  --    Currently in Pain?  Yes    Pain Score  6     Pain Location  Shoulder    Pain Orientation  Left    Pain Onset  1 to 4 weeks ago    Pain Frequency  Constant       [facility] Adult PT Treatment/Exercise - 12/04/16 1036      Shoulder Exercises: Supine   Protraction  AROM;Strengthening;Both;10 reps using wand    External Rotation  AROM;Strengthening;Both;10 reps;Theraband    Theraband Level (Shoulder External Rotation)  Level 1 (Yellow)    External Rotation Limitations  in combination with scapular retraction     Flexion  AAROM;Both;10 reps using wand    Other Supine Exercises  AAROM ER using wand; left; 15 reps       Manual Therapy   Manual Therapy  Joint mobilization    Joint Mobilization  GH joint distraction, grade 3-4 oscillations progressing into limited range ER/abuction/flexion, 30 second oscillations, 5x in each limited plane; A-P GH joint mobilization, 45-60 bouts,  5 bouts; P-A GH joint mobilization, 45-60 bouts, 5 bouts; distal clavicle mobilization, grade 1-2 for pain relief, 45-60 bouts, 4 bouts; AC joint A-P mobilization; grade 3-4; 45-60 bouts; 5 bouts   pt. positioned in supine        PT Short Term Goals - 11/21/16 1533      PT SHORT TERM GOAL #1   Title  Pt. will be I with intial HEP    Baseline  no previous HEP    Time  3    Period  Weeks    Status  New    Target Date  12/12/16      PT SHORT TERM GOAL #2   Title  Pt. will demonstrate decreased shoulder hiking and proper posture with motion to reduce pain.    Baseline  pt. demonstrates shoulder hiking during shoulder ROM and exercise     Time  3    Period  Weeks    Status  New    Target Date  12/12/16       PT Long Term Goals - 11/21/16 1536      PT LONG TERM GOAL #1   Title  Pt. will increase overall shoulder strength  in left to >/= 4/5 to allow for better lifting required for ADL's.     Baseline  shoulder strength 3-/3+/5     Time  6    Period  Weeks    Status  New    Target Date  01/02/17      PT LONG TERM GOAL #2   Title  Pt. will increase shoulder flexion/abuction/ER >/= 25 degrees with </= 2/10 pain to allow for proper functional use of LUE for activities such as grooming.    Baseline  shoulder flexion and abduction, ER A/PROM limited with pain at end-range     Time  6    Period  Weeks    Status  New    Target Date  01/02/17      PT LONG TERM GOAL #3   Title  Pt. will be independent with all prescribed HEP as of final visit.     Baseline  no previous HEP    Time  6    Period  Weeks    Status  New    Target Date  01/02/17      PT LONG TERM GOAL #4   Title  Pt will be able to lift 10+ lbs. overhead to simulate reaching or potential work related activity.     Baseline  pt. unable to move L arm past 90 degrees of shoulder flexion, pain with lifting     Time  6    Period  Weeks    Status  New    Target Date  01/02/17       Plan - 12/04/16 1050    Clinical Impression Statement  Pt. continues to present with pain and muscle guarding in left shoulder that limits ROM. Session mainly consisted of manual techniques for pain relief and to promote increased motion. Pt. was able to tolerate manual therapy without increased pain and complete AAROM exercises.      Rehab Potential  Good    PT Frequency  1x / week after Medicaid re-authorization 2x/week for 6 weeks    PT Duration  3 weeks    PT Treatment/Interventions  ADLs/Self Care Home Management;Electrical Stimulation;Iontophoresis 4mg /ml Dexamethasone;Moist Heat;Cryotherapy;Therapeutic activities;Therapeutic exercise;Manual techniques;Passive range of motion;Dry needling;Taping    PT Next Visit Plan  reassess HEP, arm  ergometer, AAROM, joint mobiliztion, soft tissue PRN, isometric strengthening, assess grip strength     PT Home Exercise Plan  wand  flexion (supine); wand abduction (seated); upper trap stretch, theraband rows     Consulted and Agree with Plan of Care  Patient       Patient will benefit from skilled therapeutic intervention in order to improve the following deficits and impairments:  Pain, Hypomobility, Decreased strength, Decreased range of motion, Increased muscle spasms  Visit Diagnosis: Chronic left shoulder pain  Stiffness of left shoulder, not elsewhere classified  Muscle weakness (generalized)     Problem List Patient Active Problem List   Diagnosis Date Noted  . Pulmonary embolus (HCC) 10/31/2016  . Chronic diastolic CHF (congestive heart failure) (HCC) 10/31/2016  . Normocytic anemia 10/31/2016  . Congestive dilated cardiomyopathy (HCC) 10/10/2016  . Preop cardiovascular exam 10/10/2016  . Vaginal itching 04/04/2016  . Dysuria 04/04/2016  . Mild dysplasia of cervix 11/24/2012  . Papanicolaou smear of cervix with low grade squamous intraepithelial lesion (LGSIL) 10/21/2012  . HTN (hypertension) 12/10/2011  . Fatigue 03/04/2010  . OBESITY, MORBID 02/10/2010  . ESSENTIAL HYPERTENSION, BENIGN 07/20/2009  . Secondary cardiomyopathy, unspecified 07/20/2009  . EDEMA 07/20/2009    Maryruth Hancock, SPT 12/04/16 10:56 AM   [facility] [address] [place], [place], [address] Phone: [phone]   Fax:  [phone]  Name: Allison [name] MRN: [id number] Date of Birth: May 30, 1980

## 2016-12-11 ENCOUNTER — Encounter: Payer: Self-pay | Admitting: Physical Therapy

## 2016-12-11 ENCOUNTER — Ambulatory Visit: Payer: Medicaid Other | Attending: Specialist | Admitting: Physical Therapy

## 2016-12-11 DIAGNOSIS — M6281 Muscle weakness (generalized): Secondary | ICD-10-CM

## 2016-12-11 DIAGNOSIS — G8929 Other chronic pain: Secondary | ICD-10-CM | POA: Insufficient documentation

## 2016-12-11 DIAGNOSIS — M25612 Stiffness of left shoulder, not elsewhere classified: Secondary | ICD-10-CM | POA: Diagnosis present

## 2016-12-11 DIAGNOSIS — M25512 Pain in left shoulder: Secondary | ICD-10-CM | POA: Insufficient documentation

## 2016-12-11 NOTE — Patient Instructions (Signed)
External Rotation (Isometric)    With elbow bent and held at side, use other hand to apply resistance to outward motion of arm. Hold _5- 10___ seconds. Repeat __5to 10__ times. Do _1___ sessions per day.  Copyright  VHI. All rights reserved.

## 2016-12-11 NOTE — Therapy (Signed)
[facility] [address] [place], [place], [address] Phone: [phone]   Fax:  [phone]  Physical Therapy Treatment  Patient Details  Name: Allison [name] MRN: [id number] Date of Birth: Sep 12, 1980 Referring Provider: R. Valma Cava, MD   Encounter Date: 12/11/2016  PT End of Session - 12/11/16 1149    Visit Number  3    Number of Visits  12    Date for PT Re-Evaluation  01/02/17    PT Start Time  1105    PT Stop Time  1158    PT Time Calculation (min)  53 min    Activity Tolerance  Patient tolerated treatment well    Behavior During Therapy  [facility] for tasks assessed/performed       Past Medical History:  Diagnosis Date  . Arthritis   . 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 Carpal tunnel syndrome of right wrist   . Chronic back pain   . Gallstones   . GERD (gastroesophageal reflux disease)    takes Omeprazole daily  . History of migraine   . Migraines   . Morbid obesity (HCC)   . Nausea    takes Phenergan as needed  . Osteoarthritis    takes Relafen daily  . PIH (pregnancy induced hypertension)    takes Coreg,Enalapril,and Amlodipine daily    Past Surgical History:  Procedure Laterality Date  . CARPAL TUNNEL RELEASE Right 08/02/2016   Procedure: CARPAL TUNNEL RELEASE RIGHT;  Surgeon: Coletta Memos, MD;  Location: [facility] OR;  Service: Neurosurgery;  Laterality: Right;  right  . CESAREAN SECTION  2011  . CHOLECYSTECTOMY N/A 01/23/2013   Procedure: LAPAROSCOPIC CHOLECYSTECTOMY;  Surgeon: Atilano Ina, MD;  Location: [facility] OR;  Service: General;  Laterality: N/A;  . TUBAL LIGATION  2011    There were no vitals filed for this visit.  Subjective Assessment - 12/11/16 1108    Subjective  Shoulder has a little more movement.      Currently in Pain?  Yes    Pain Score  7     Pain Location  Shoulder    Pain Orientation  Left;Anterior;Lateral    Pain Descriptors / Indicators  Throbbing;Sharp    Aggravating Factors   moving,, sleeping on it,  lifting,  cold weather    Pain Relieving Factors  ice,  meds.      Effect of Pain on Daily Activities  Limits,  ADL.   Extra time.          [facility] PT Assessment - 12/11/16 0001      AROM   Left Shoulder External Rotation  35 Degrees post manual      PROM   Left Shoulder Flexion  135 Degrees AAA  pulleys.                  [facility] Adult PT Treatment/Exercise - 12/11/16 0001      Shoulder Exercises: Seated   External Rotation  10 reps cane,  also PROM ER with elbow straight.      Shoulder Exercises: Standing   Internal Rotation  10 reps;AAROM cane    Flexion  10 reps;AAROM UE ranger,  2 positions    Extension  10 reps cane      Shoulder Exercises: Pulleys   Flexion  3 minutes      Shoulder Exercises: Isometric Strengthening   Flexion  5X5"    Extension  5X5"    External Rotation  5X5"    Internal Rotation  5X5"    ABduction  5X5"    ADduction  5X5"      Modalities   Modalities  Cryotherapy      Moist Heat Therapy   Moist Heat Location  Shoulder concurrent with manual and pulleys      Cryotherapy   Number Minutes Cryotherapy  10 Minutes    Cryotherapy Location  Shoulder    Type of Cryotherapy  -- cold pack      Manual Therapy   Manual Therapy  Joint mobilization;Soft tissue mobilization;Myofascial release    Manual therapy comments  ROM improving.  anterior  scar tissue work.      Joint Mobilization  distraction,  posterior and lateral   mobs.    Also self mobs with rolled towel  and arm posterior add with distraction.    Soft tissue mobilization  shoulder    Myofascial Release  anterior shoulder             PT Education - 12/11/16 1149    Education provided  Yes    Education Details  HEP    Person(s) Educated  Patient    Methods  Explanation;Demonstration;Tactile cues;Verbal cues;Handout    Comprehension  Returned demonstration;Verbalized understanding       PT Short Term Goals - 11/21/16 1533       PT SHORT TERM GOAL #1   Title  Pt. will be I with intial HEP    Baseline  no previous HEP    Time  3    Period  Weeks    Status  New    Target Date  12/12/16      PT SHORT TERM GOAL #2   Title  Pt. will demonstrate decreased shoulder hiking and proper posture with motion to reduce pain.    Baseline  pt. demonstrates shoulder hiking during shoulder ROM and exercise     Time  3    Period  Weeks    Status  New    Target Date  12/12/16        PT Long Term Goals - 11/21/16 1536      PT LONG TERM GOAL #1   Title  Pt. will increase overall shoulder strength in left to >/= 4/5 to allow for better lifting required for ADL's.     Baseline  shoulder strength 3-/3+/5     Time  6    Period  Weeks    Status  New    Target Date  01/02/17      PT LONG TERM GOAL #2   Title  Pt. will increase shoulder flexion/abuction/ER >/= 25 degrees with </= 2/10 pain to allow for proper functional use of LUE for activities such as grooming.    Baseline  shoulder flexion and abduction, ER A/PROM limited with pain at end-range     Time  6    Period  Weeks    Status  New    Target Date  01/02/17      PT LONG TERM GOAL #3   Title  Pt. will be independent with all prescribed HEP as of final visit.     Baseline  no previous HEP    Time  6    Period  Weeks    Status  New    Target Date  01/02/17      PT LONG TERM GOAL #4   Title  Pt will be able to lift 10+ lbs. overhead to simulate reaching or potential work related activity.  Baseline  pt. unable to move L arm past 90 degrees of shoulder flexion, pain with lifting     Time  6    Period  Weeks    Status  New    Target Date  01/02/17            Plan - 12/11/16 1310    Clinical Impression Statement  pain 4-5/10 prior to cold pack.  ER Left increased to 35 .  Patient enjoyed the pulleys and plans to ask boyfriend to help.     PT Next Visit Plan  reassess HEP, arm ergometer, AAROM, joint mobiliztion, soft tissue PRN, isometric  strengthening, assess grip strength pulleys    PT Home Exercise Plan  wand flexion (supine); wand abduction (seated); upper trap stretch, theraband rows ,  pulleys,  ER isometric    Consulted and Agree with Plan of Care  Patient       Patient will benefit from skilled therapeutic intervention in order to improve the following deficits and impairments:     Visit Diagnosis: Chronic left shoulder pain  Stiffness of left shoulder, not elsewhere classified  Muscle weakness (generalized)     Problem List Patient Active Problem List   Diagnosis Date Noted  . Pulmonary embolus (HCC) 10/31/2016  . Chronic diastolic CHF (congestive heart failure) (HCC) 10/31/2016  . Normocytic anemia 10/31/2016  . Congestive dilated cardiomyopathy (HCC) 10/10/2016  . Preop cardiovascular exam 10/10/2016  . Vaginal itching 04/04/2016  . Dysuria 04/04/2016  . Mild dysplasia of cervix 11/24/2012  . Papanicolaou smear of cervix with low grade squamous intraepithelial lesion (LGSIL) 10/21/2012  . HTN (hypertension) 12/10/2011  . Fatigue 03/04/2010  . OBESITY, MORBID 02/10/2010  . ESSENTIAL HYPERTENSION, BENIGN 07/20/2009  . Secondary cardiomyopathy, unspecified 07/20/2009  . EDEMA 07/20/2009    HARRIS,KAREN  PTA 12/11/2016, 1:13 PM  [facility] [address] [place], [place], [address] Phone: [phone]   Fax:  [phone]  Name: Allison [name] MRN: [id number] Date of Birth: 05/11/1980

## 2016-12-13 NOTE — Therapy (Signed)
[facility] [address] [place], [place], [address] Phone: [phone]   Fax:  [phone]  Patient Details  Name: Allison [name] MRN: [id number] Date of Birth: 1980/11/04 Referring Provider:  No ref. provider found  Encounter Date: 12/13/2016  Pt did not return for occupational therapy after 2nd visit on 10/09/16, therefore will d/c episode of care at this time. Pt had only met LTG #1.   Carey Bullocks, OTR/L 12/13/2016, 12:48 PM  [place] [address] [place], [place], [address] Phone: [phone]   Fax:  [phone]

## 2016-12-18 ENCOUNTER — Ambulatory Visit: Payer: Medicaid Other | Admitting: Physical Therapy

## 2017-01-10 ENCOUNTER — Encounter: Payer: Self-pay | Admitting: Physical Therapy

## 2017-01-10 ENCOUNTER — Ambulatory Visit: Payer: Medicaid Other | Attending: Specialist | Admitting: Physical Therapy

## 2017-01-10 DIAGNOSIS — M79641 Pain in right hand: Secondary | ICD-10-CM | POA: Insufficient documentation

## 2017-01-10 DIAGNOSIS — G8929 Other chronic pain: Secondary | ICD-10-CM | POA: Insufficient documentation

## 2017-01-10 DIAGNOSIS — M25512 Pain in left shoulder: Secondary | ICD-10-CM | POA: Diagnosis not present

## 2017-01-10 DIAGNOSIS — M25612 Stiffness of left shoulder, not elsewhere classified: Secondary | ICD-10-CM | POA: Insufficient documentation

## 2017-01-10 DIAGNOSIS — M6281 Muscle weakness (generalized): Secondary | ICD-10-CM | POA: Insufficient documentation

## 2017-01-10 NOTE — Therapy (Signed)
[place], [place], [address] Phone: [phone]   Fax:  [phone]  Physical Therapy Treatment / Re-certification  Patient Details  Name: Allison [name] MRN: [id number] Date of Birth: 03-08-1980 Referring Provider: R. Hart Robinsons, MD   Encounter Date: 01/10/2017  PT End of Session - 01/10/17 1014    Visit Number  4    Number of Visits  12    Date for PT Re-Evaluation  01/02/17    Authorization Type  Medicaid (reassess after 4th visit for re-authorization)    PT Start Time  0931    PT Stop Time  1015    PT Time Calculation (min)  44 min    Activity Tolerance  Patient tolerated treatment well    Behavior During Therapy  [facility] for tasks assessed/performed       Past Medical History:  Diagnosis Date  . Arthritis   . 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 Carpal tunnel syndrome of right wrist   . Chronic back pain   . Gallstones   . GERD (gastroesophageal reflux disease)    takes Omeprazole daily  . History of migraine   . Migraines   . Morbid obesity ([place])   . Nausea    takes Phenergan as needed  . Osteoarthritis    takes Relafen daily  . PIH (pregnancy induced hypertension)    takes Coreg,Enalapril,and Amlodipine daily    Past Surgical History:  Procedure Laterality Date  . CARPAL TUNNEL RELEASE Right 08/02/2016   Procedure: CARPAL TUNNEL RELEASE RIGHT;  Surgeon: Ashok Pall, MD;  Location: [place];  Service: Neurosurgery;  Laterality: Right;  right  . CESAREAN SECTION  2011  . CHOLECYSTECTOMY N/A 01/23/2013   Procedure: LAPAROSCOPIC CHOLECYSTECTOMY;  Surgeon: Gayland Curry, MD;  Location: [place];  Service: General;  Laterality: N/A;  . TUBAL LIGATION  2011    There were no vitals filed for this visit.  Subjective Assessment - 01/10/17 0932    Subjective  "the shoulder is alright, more pain noted when I lay on it, and some soreness with lifting/ carrying.    Currently in  Pain?  Yes    Pain Score  4     Pain Location  Shoulder    Pain Orientation  Left;Lateral    Pain Descriptors / Indicators  Shooting    Pain Type  Surgical pain    Aggravating Factors   lifting the arm, and laying on the shoulder at night    Pain Relieving Factors  exercise, icing.          [facility] PT Assessment - 01/10/17 0948      AROM   Left Shoulder Extension  45 Degrees    Left Shoulder Flexion  106 Degrees    Left Shoulder ABduction  75 Degrees with shoulder hiking    Left Shoulder Internal Rotation  58 Degrees assessed in 90/90    Left Shoulder External Rotation  40 Degrees in 90/90      PROM   Left Shoulder Flexion  138 Degrees AAA  pulleys.    Left Shoulder ABduction  122 Degrees      Strength   Left Shoulder Flexion  3+/5 within available ROM    Left Shoulder Extension  4/5    Left Shoulder ABduction  3+/5 in available ROM, pain during testing    Left Shoulder Internal Rotation  4/5 in available ROM    Left Shoulder External Rotation  4/5 in available ROM  Palpation   Palpation comment  TTP over the sub-acromial space and corocoid process                  [facility] Adult PT Treatment/Exercise - 01/10/17 0948      Shoulder Exercises: Standing   External Rotation  12 reps;Theraband    Theraband Level (Shoulder External Rotation)  Level 2 (Red)    Internal Rotation  12 reps;Theraband    Theraband Level (Shoulder Internal Rotation)  Level 2 (Red)      Shoulder Exercises: Pulleys   Flexion  2 minutes    ABduction  2 minutes      Shoulder Exercises: ROM/Strengthening   UBE (Upper Arm Bike)  L1 x 6 min changing direction at 3 min      Shoulder Exercises: Stretch   Other Shoulder Stretches  levator scapulae stretch 2 x 30 sec added to HEP    Other Shoulder Stretches  upper trap stretch 2 x 30 sec hold             PT Education - 01/10/17 1013    Education provided  Yes    Education Details  reviewed previously provided HEP, and updated HEP  for continued shoulder strengthening.     Person(s) Educated  Patient    Methods  Explanation;Verbal cues    Comprehension  Verbalized understanding;Verbal cues required       PT Short Term Goals - 01/10/17 0941      PT SHORT TERM GOAL #1   Title  Pt. will be I with intial HEP    Time  3    Period  Weeks    Status  Achieved      PT SHORT TERM GOAL #2   Title  Pt. will demonstrate decreased shoulder hiking and proper posture with motion to reduce pain.    Baseline  continues to hike mostly with shoulder abduction with pain rated at 6/10    Time  3    Period  Weeks    Status  Partially Met    Target Date  01/31/17        PT Long Term Goals - 01/10/17 0942      PT LONG TERM GOAL #1   Title  Pt. will increase overall shoulder strength in left to >/= 4/5 to allow for better lifting required for ADL's.     Baseline  in available ROM 3+/5 with flexion and abduction, 4/5 with IR/ER and extension     Time  4    Period  Weeks    Status  Partially Met    Target Date  02/07/17      PT LONG TERM GOAL #2   Title  Pt. will increase shoulder flexion/abuction/ER >/= 25 degrees with </= 2/10 pain to allow for proper functional use of LUE for activities such as grooming.    Baseline  flexion 106, abduction 75, IR 58, and ER 40 degrees with pain during abduction.     Time  4    Period  Weeks    Status  On-going    Target Date  02/07/17      PT LONG TERM GOAL #3   Title  Pt. will be independent with all prescribed HEP as of final visit.     Baseline  independent with current prescribed HEP, and progressing as tolerated with treatment.     Time  4    Status  On-going      PT LONG TERM GOAL #  4   Title  Pt will be able to lift 10+ lbs. overhead to simulate reaching or potential work related activity.     Baseline  unable to lift weight overhead due to pain and weakness in the shoulder.     Time  4    Period  Weeks    Status  On-going    Target Date  02/07/17            Plan -  01/10/17 1015    Clinical Impression Statement  pt has demonstrated improvement in shoulder mobility and strength within available ROM, but continues demonstate limited motion/ strength compared bil. she notes pain at 6/10 with above head movement/ lifting. she is progressing with STG's and LTG's appropriately for the period of time from which she has been seen for physical therapy. Updated HEP for progressing shoulder AAROM and scapular stability strengthening. She would benefit from continued physical therapy to promote functional shoulder ROM/ Strength and return to fucntional mobility as well as meeting remaining goals for independent exercise.     Rehab Potential  Good    PT Frequency  1x / week    PT Duration  3 weeks progressing to 2 x a week for 3 weeks following inital medicaid auth    PT Treatment/Interventions  ADLs/Self Care Home Management;Electrical Stimulation;Iontophoresis 80m/ml Dexamethasone;Moist Heat;Cryotherapy;Therapeutic activities;Therapeutic exercise;Manual techniques;Passive range of motion;Dry needling;Taping    PT Next Visit Plan  reassess HEP, arm ergometer, AAROM, joint mobiliztion, soft tissue PRN, scapular stability strengthening    PT Home Exercise Plan  wand flexion (supine); wand abduction (seated); upper trap stretch, theraband rows ,  pulleys,  ER isometric, internal/ external rotation, wall walks, levator scapulae stretch    Consulted and Agree with Plan of Care  Patient       Patient will benefit from skilled therapeutic intervention in order to improve the following deficits and impairments:  Pain, Hypomobility, Decreased strength, Decreased range of motion, Increased muscle spasms  Visit Diagnosis: Chronic left shoulder pain  Stiffness of left shoulder, not elsewhere classified  Muscle weakness (generalized)     Problem List Patient Active Problem List   Diagnosis Date Noted  . Pulmonary embolus ([place] 10/31/2016  . Chronic diastolic CHF (congestive  heart failure) ([place] 10/31/2016  . Normocytic anemia 10/31/2016  . Congestive dilated cardiomyopathy ([place] 10/10/2016  . Preop cardiovascular exam 10/10/2016  . Vaginal itching 04/04/2016  . Dysuria 04/04/2016  . Mild dysplasia of cervix 11/24/2012  . Papanicolaou smear of cervix with low grade squamous intraepithelial lesion (LGSIL) 10/21/2012  . HTN (hypertension) 12/10/2011  . Fatigue 03/04/2010  . OBESITY, MORBID 02/10/2010  . ESSENTIAL HYPERTENSION, BENIGN 07/20/2009  . Secondary cardiomyopathy, unspecified 07/20/2009  . EDEMA 07/20/2009   KStarr LakePT, DPT, LAT, ATC  01/10/17  10:19 AM      [facility] [address]Phone: [phone]  Fax:  [phone] Name: SRYLEA SELWAYMRN: [id number] of Birth: 921-May-1982

## 2017-01-23 ENCOUNTER — Ambulatory Visit: Payer: Medicaid Other | Admitting: Physical Therapy

## 2017-01-30 ENCOUNTER — Ambulatory Visit: Payer: Medicaid Other | Admitting: Physical Therapy

## 2017-02-06 ENCOUNTER — Encounter: Payer: Self-pay | Admitting: Physical Therapy

## 2017-02-06 ENCOUNTER — Ambulatory Visit: Payer: Medicaid Other | Admitting: Physical Therapy

## 2017-02-06 DIAGNOSIS — M6281 Muscle weakness (generalized): Secondary | ICD-10-CM

## 2017-02-06 DIAGNOSIS — G8929 Other chronic pain: Secondary | ICD-10-CM

## 2017-02-06 DIAGNOSIS — M25612 Stiffness of left shoulder, not elsewhere classified: Secondary | ICD-10-CM

## 2017-02-06 DIAGNOSIS — M25512 Pain in left shoulder: Secondary | ICD-10-CM | POA: Diagnosis not present

## 2017-02-06 DIAGNOSIS — M79641 Pain in right hand: Secondary | ICD-10-CM

## 2017-02-06 NOTE — Therapy (Signed)
[facility] [address] [place], [place], [address] Phone: [phone]   Fax:  [phone]  Physical Therapy Treatment / Re-evaluation  Patient Details  Name: Allison [name] MRN: [id number] Date of Birth: 03/22/1980 Referring Provider: R. Valma CavaAndrew Collins, MD   Encounter Date: 02/06/2017  PT End of Session - 02/06/17 1146    Visit Number  5    Number of Visits  12    Date for PT Re-Evaluation  03/06/17    PT Start Time  1103    PT Stop Time  1142    PT Time Calculation (min)  39 min    Activity Tolerance  Patient tolerated treatment well    Behavior During Therapy  [facility] for tasks assessed/performed       Past Medical History:  Diagnosis Date  . Arthritis   . 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 Carpal tunnel syndrome of right wrist   . Chronic back pain   . Gallstones   . GERD (gastroesophageal reflux disease)    takes Omeprazole daily  . History of migraine   . Migraines   . Morbid obesity ([facility])   . Nausea    takes Phenergan as needed  . Osteoarthritis    takes Relafen daily  . PIH (pregnancy induced hypertension)    takes Coreg,Enalapril,and Amlodipine daily    Past Surgical History:  Procedure Laterality Date  . CARPAL TUNNEL RELEASE Right 08/02/2016   Procedure: CARPAL TUNNEL RELEASE RIGHT;  Surgeon: Coletta Memosabbell, Kyle, MD;  [facility];  Service: Neurosurgery;  Laterality: Right;  right  . CESAREAN SECTION  2011  . CHOLECYSTECTOMY N/A 01/23/2013   Procedure: LAPAROSCOPIC CHOLECYSTECTOMY;  Surgeon: Atilano InaEric M Wilson, MD;  [facility];  Service: General;  Laterality: N/A;  . TUBAL LIGATION  2011    There were no vitals filed for this visit.  Subjective Assessment - 02/06/17 1109    Subjective  "somedays are better then others but it is getting better but I still having trouble sleeping"     Patient Stated Goals  "be able to move"    Currently in Pain?  Yes    Pain Score  4     Pain Location   Shoulder    Pain Orientation  Left;Lateral    Pain Descriptors / Indicators  Throbbing    Pain Type  Surgical pain;Chronic pain    Aggravating Factors   rolling over, prolonged carrying or lifting heavy items    Pain Relieving Factors  heat/ ice, stretches, and doing the exercise.          [facility] PT Assessment - 02/06/17 1111      AROM   Left Shoulder Extension  48 Degrees    Left Shoulder Flexion  132 Degrees    Left Shoulder ABduction  80 Degrees    Left Shoulder Internal Rotation  48 Degrees at 80 degress abd    Left Shoulder External Rotation  44 Degrees at 80 degrees abd, pain noted at endrange      Strength   Left Shoulder Flexion  3+/5 in available ROM    Left Shoulder Extension  4+/5    Left Shoulder ABduction  3+/5 in available ROM    Left Shoulder Internal Rotation  4/5    Left Shoulder External Rotation  4/5                  [facility] Adult PT Treatment/Exercise - 02/06/17 1121      Shoulder  Exercises: Supine   Protraction  15 reps;Both 5# using dowel      Shoulder Exercises: Standing   External Rotation  10 reps;Left;Strengthening    Theraband Level (Shoulder External Rotation)  Level 3 (Green)    Internal Rotation  10 reps;Theraband;Strengthening;Left    Row  Strengthening;Both;10 reps;Theraband    Theraband Level (Shoulder Row)  Level 3 (Green)    Other Standing Exercises  wall-push ups 1 x 5 given as HEP      Shoulder Exercises: Stretch   Other Shoulder Stretches  Rhomboid stretch 2 x 30 sec hold, levator scapulae stretch 2 x 30 sec    Other Shoulder Stretches  upper trap stretch 2 x 30 sec hold      Manual Therapy   Joint Mobilization  self mobilization 2 x 10 holding              PT Education - 02/06/17 1145    Education provided  Yes    Education Details  reviewed previosly provided HEP, benefits of continuing therapy, and reviewed attendance policy.     Person(s) Educated  Patient    Methods  Explanation;Verbal cues;Handout     Comprehension  Verbalized understanding;Verbal cues required       PT Short Term Goals - 02/06/17 1115      PT SHORT TERM GOAL #1   Title  Pt. will be I with intial HEP    Time  3    Period  Weeks    Status  Achieved      PT SHORT TERM GOAL #2   Title  Pt. will demonstrate decreased shoulder hiking and proper posture with motion to reduce pain.    Baseline  continues to hike mostly with shoulder abduction with pain rated at 4-6/10    Time  3    Period  Weeks    Status  Achieved        PT Long Term Goals - 02/06/17 1115      PT LONG TERM GOAL #1   Title  Pt. will increase overall shoulder strength in left to >/= 4/5 to allow for better lifting required for ADL's.     Baseline  3+/5 with flexion/ abduction, 4/5 with Ir/ER in available ROM due to ROM limtations    Period  Weeks    Status  On-going    Target Date  03/06/17      PT LONG TERM GOAL #2   Title  Pt. will increase shoulder flexion/abuction/ER >/= 25 degrees with </= 2/10 pain to allow for proper functional use of LUE for activities such as grooming.    Baseline  abduction 80,  IR 48, ER 44 with 4/10  pain during motions    Time  4    Period  Weeks    Status  On-going    Target Date  03/06/17      PT LONG TERM GOAL #3   Title  Pt. will be independent with all prescribed HEP as of final visit.     Baseline  independent with current prescribed HEP, and progressing as tolerated with treatment.     Time  4    Period  Weeks    Status  On-going    Target Date  03/06/17      PT LONG TERM GOAL #4   Title  Pt will be able to lift 10+ lbs. overhead to simulate reaching or potential work related activity.     Baseline  Difficulty lifting 10#  overhead reporting 5/10 pain     Time  4    Period  Weeks    Status  On-going    Target Date  03/06/17            Plan - 02/06/17 1146    Clinical Impression Statement  pt reports she was unable to attend the last scheduled appointments due to her daughter being sick. She  is progressing with shoulder mobility in all planes except for shoulder abduction which today is at 80 degrees. she continues to report pain with activity specifically overhead with shoulder abduction, internal and external rotation. She is progressing well with goals and has been consistent with her current HEP, and is motivated to continued with treatment. She would benefit from continued physical therapy to continue with increasing ROM focusing on overhead activity, increase strength in the shoulder to provide stability and assist with lifting activities, and meet the remaining goals and promote independent exercise with 1 x a week for the next 4 weeks.     Rehab Potential  Good    PT Treatment/Interventions  ADLs/Self Care Home Management;Electrical Stimulation;Iontophoresis 4mg /ml Dexamethasone;Moist Heat;Cryotherapy;Therapeutic activities;Therapeutic exercise;Manual techniques;Passive range of motion;Dry needling;Taping    PT Next Visit Plan  arm ergometer, AROM, joint mobiliztion, scapular stability strengthening, lower trap strengthening. promote scapulohumeral rhythm    PT Home Exercise Plan  wand flexion (supine); wand abduction (seated); upper trap stretch, theraband rows ,  pulleys,  ER isometric, internal/ external rotation, wall walks, levator scapulae stretch, wall push-up, and self inferior mobs.     Consulted and Agree with Plan of Care  Patient       Patient will benefit from skilled therapeutic intervention in order to improve the following deficits and impairments:  Pain, Hypomobility, Decreased strength, Decreased range of motion, Increased muscle spasms  Visit Diagnosis: Chronic left shoulder pain  Stiffness of left shoulder, not elsewhere classified  Muscle weakness (generalized)  Pain in right hand     Problem List Patient Active Problem List   Diagnosis Date Noted  . Pulmonary embolus ([facility]) 10/31/2016  . Chronic diastolic CHF (congestive heart failure) ([facility])  10/31/2016  . Normocytic anemia 10/31/2016  . Congestive dilated cardiomyopathy ([facility]) 10/10/2016  . Preop cardiovascular exam 10/10/2016  . Vaginal itching 04/04/2016  . Dysuria 04/04/2016  . Mild dysplasia of cervix 11/24/2012  . Papanicolaou smear of cervix with low grade squamous intraepithelial lesion (LGSIL) 10/21/2012  . HTN (hypertension) 12/10/2011  . Fatigue 03/04/2010  . OBESITY, MORBID 02/10/2010  . ESSENTIAL HYPERTENSION, BENIGN 07/20/2009  . Secondary cardiomyopathy, unspecified 07/20/2009  . EDEMA 07/20/2009   Lulu Riding PT, DPT, LAT, ATC  02/06/17  12:38 PM      [facility] [address] [place], [place], [address] Phone: [phone]   Fax:  [phone]  Name: Allison [name] MRN: [id number] Date of Birth: 09-Sep-1980

## 2017-02-20 ENCOUNTER — Encounter: Payer: Self-pay | Admitting: Physical Therapy

## 2017-02-20 ENCOUNTER — Ambulatory Visit: Payer: Medicaid Other | Attending: Specialist | Admitting: Physical Therapy

## 2017-02-20 DIAGNOSIS — M6281 Muscle weakness (generalized): Secondary | ICD-10-CM | POA: Diagnosis present

## 2017-02-20 DIAGNOSIS — M79641 Pain in right hand: Secondary | ICD-10-CM | POA: Insufficient documentation

## 2017-02-20 DIAGNOSIS — M25612 Stiffness of left shoulder, not elsewhere classified: Secondary | ICD-10-CM

## 2017-02-20 DIAGNOSIS — G8929 Other chronic pain: Secondary | ICD-10-CM | POA: Diagnosis present

## 2017-02-20 DIAGNOSIS — M25512 Pain in left shoulder: Secondary | ICD-10-CM | POA: Insufficient documentation

## 2017-02-20 NOTE — Therapy (Signed)
[facility] [address] [place], [place], [address] Phone: [phone]   Fax:  [phone]  Physical Therapy Treatment  Patient Details  Name: Allison [name] MRN: [id number] Date of Birth: Apr 25, 1980 Referring Provider: R. Valma CavaAndrew Collins, MD   Encounter Date: 02/20/2017  PT End of Session - 02/20/17 1716    Visit Number  6    Number of Visits  12    Date for PT Re-Evaluation  03/06/17    Authorization Type  Medicaid (reassess after 9th visit for re-authorization)    PT Start Time  1631    PT Stop Time  1714    PT Time Calculation (min)  43 min    Activity Tolerance  Patient tolerated treatment well    Behavior During Therapy  [facility] for tasks assessed/performed       Past Medical History:  Diagnosis Date  . Arthritis   . 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 Carpal tunnel syndrome of right wrist   . Chronic back pain   . Gallstones   . GERD (gastroesophageal reflux disease)    takes Omeprazole daily  . History of migraine   . Migraines   . Morbid obesity (HCC)   . Nausea    takes Phenergan as needed  . Osteoarthritis    takes Relafen daily  . PIH (pregnancy induced hypertension)    takes Coreg,Enalapril,and Amlodipine daily    Past Surgical History:  Procedure Laterality Date  . CARPAL TUNNEL RELEASE Right 08/02/2016   Procedure: CARPAL TUNNEL RELEASE RIGHT;  Surgeon: Coletta Memosabbell, Kyle, MD;  [facility];  Service: Neurosurgery;  Laterality: Right;  right  . CESAREAN SECTION  2011  . CHOLECYSTECTOMY N/A 01/23/2013   Procedure: LAPAROSCOPIC CHOLECYSTECTOMY;  Surgeon: Atilano InaEric M Wilson, MD;  [facility];  Service: General;  Laterality: N/A;  . TUBAL LIGATION  2011    There were no vitals filed for this visit.  Subjective Assessment - 02/20/17 1634    Subjective  "I am doing my exercises everyday and it seems to still help"     Currently in Pain?  Yes    Pain Score  3     Pain Onset  1 to 4 weeks  ago    Pain Frequency  Constant    Aggravating Factors   rolling over and laying on it     Pain Relieving Factors  heat/ ice, stretches and doing the exercisees.     Multiple Pain Sites  Yes                      [facility] Adult PT Treatment/Exercise - 02/20/17 1638      Shoulder Exercises: Supine   Protraction  15 reps;Both with dowel rod bil UE 3#      Shoulder Exercises: Prone   Other Prone Exercises  I's T's Y's 2 x 10 with 2#      Shoulder Exercises: ROM/Strengthening   UBE (Upper Arm Bike)  L2 x 6min changing direction at 3 min    Other ROM/Strengthening Exercises  dowel rod flexion with 3# reaching over head 2 x 10      Shoulder Exercises: Stretch   Other Shoulder Stretches  Rhomboid stretch 2 x 30 sec hold, levator scapulae stretch 2 x 30 sec    Other Shoulder Stretches  upper trap stretch 2 x 30 sec hold      Shoulder Exercises: Body Blade   Other Body Blade Exercises  IR/ER 3  x 20 sec    Other Body Blade Exercises  bicep flexion/ extension 3 x 20 sec      Manual Therapy   Manual Therapy  Taping    Joint Mobilization  Grade 3 inferior/ posterior grade discal     Mulligan  distal clavicle taping on L               PT Short Term Goals - 02/06/17 1115      PT SHORT TERM GOAL #1   Title  Pt. will be I with intial HEP    Time  3    Period  Weeks    Status  Achieved      PT SHORT TERM GOAL #2   Title  Pt. will demonstrate decreased shoulder hiking and proper posture with motion to reduce pain.    Baseline  continues to hike mostly with shoulder abduction with pain rated at 4-6/10    Time  3    Period  Weeks    Status  Achieved        PT Long Term Goals - 02/06/17 1115      PT LONG TERM GOAL #1   Title  Pt. will increase overall shoulder strength in left to >/= 4/5 to allow for better lifting required for ADL's.     Baseline  3+/5 with flexion/ abduction, 4/5 with Ir/ER in available ROM due to ROM limtations    Period  Weeks    Status   On-going    Target Date  03/06/17      PT LONG TERM GOAL #2   Title  Pt. will increase shoulder flexion/abuction/ER >/= 25 degrees with </= 2/10 pain to allow for proper functional use of LUE for activities such as grooming.    Baseline  abduction 80,  IR 48, ER 44 with 4/10  pain during motions    Time  4    Period  Weeks    Status  On-going    Target Date  03/06/17      PT LONG TERM GOAL #3   Title  Pt. will be independent with all prescribed HEP as of final visit.     Baseline  independent with current prescribed HEP, and progressing as tolerated with treatment.     Time  4    Period  Weeks    Status  On-going    Target Date  03/06/17      PT LONG TERM GOAL #4   Title  Pt will be able to lift 10+ lbs. overhead to simulate reaching or potential work related activity.     Baseline  Difficulty lifting 10# overhead reporting 5/10 pain     Time  4    Period  Weeks    Status  On-going    Target Date  03/06/17            Plan - 02/20/17 1716    Clinical Impression Statement  pt continues to report soreness inthe Shoulder at a 3-4/10 but is improving with shoulder mobility. Trialed distal clavicle taping to provide stability which she reported reduction of pain especially will overhead activity. continued working on shoulder strengthening and scapular setting. post session she reported no increase in pain.     PT Next Visit Plan  UBE, AROM, joint mobiliztion, scapular stability strengthening, lower trap strengthening. promote scapulohumeral rhythm    PT Home Exercise Plan  wand flexion (supine); wand abduction (seated); upper trap stretch, theraband rows ,  pulleys,  ER isometric, internal/ external rotation, wall walks, levator scapulae stretch, wall push-up, and self inferior mobs.     Consulted and Agree with Plan of Care  Patient       Patient will benefit from skilled therapeutic intervention in order to improve the following deficits and impairments:  Pain, Hypomobility,  Decreased strength, Decreased range of motion, Increased muscle spasms  Visit Diagnosis: Chronic left shoulder pain  Stiffness of left shoulder, not elsewhere classified  Muscle weakness (generalized)  Pain in right hand     Problem List Patient Active Problem List   Diagnosis Date Noted  . Pulmonary embolus (HCC) 10/31/2016  . Chronic diastolic CHF (congestive heart failure) (HCC) 10/31/2016  . Normocytic anemia 10/31/2016  . Congestive dilated cardiomyopathy (HCC) 10/10/2016  . Preop cardiovascular exam 10/10/2016  . Vaginal itching 04/04/2016  . Dysuria 04/04/2016  . Mild dysplasia of cervix 11/24/2012  . Papanicolaou smear of cervix with low grade squamous intraepithelial lesion (LGSIL) 10/21/2012  . HTN (hypertension) 12/10/2011  . Fatigue 03/04/2010  . OBESITY, MORBID 02/10/2010  . ESSENTIAL HYPERTENSION, BENIGN 07/20/2009  . Secondary cardiomyopathy, unspecified 07/20/2009  . EDEMA 07/20/2009   Allison [name] PT, DPT, LAT, ATC  02/20/17  5:21 PM      [facility] [address] [place], [place], [address] Phone: [phone]   Fax:  [phone]  Name: Allison [name] MRN: [id number] Date of Birth: [birth year]

## 2017-02-27 ENCOUNTER — Telehealth: Payer: Self-pay | Admitting: Physical Therapy

## 2017-02-27 ENCOUNTER — Ambulatory Visit: Payer: Medicaid Other | Admitting: Physical Therapy

## 2017-02-27 NOTE — Telephone Encounter (Signed)
LVM regarding missed appointment today. Noted when her next scheduled visit is and if she is unable to attend she is able to call and cancel or reschedule.

## 2017-03-06 ENCOUNTER — Ambulatory Visit: Payer: Medicaid Other

## 2017-03-06 DIAGNOSIS — M25612 Stiffness of left shoulder, not elsewhere classified: Secondary | ICD-10-CM

## 2017-03-06 DIAGNOSIS — M25512 Pain in left shoulder: Principal | ICD-10-CM

## 2017-03-06 DIAGNOSIS — G8929 Other chronic pain: Secondary | ICD-10-CM

## 2017-03-06 DIAGNOSIS — M6281 Muscle weakness (generalized): Secondary | ICD-10-CM

## 2017-03-06 NOTE — Therapy (Signed)
[facility] [address] [place], [place], [address] Phone: [phone]   Fax:  [phone]  Physical Therapy Treatment  Patient Details  Name: Allison [name] MRN: [id number] Date of Birth: 1980/11/14 Referring Provider: R. Valma Cava, MD   Encounter Date: 03/06/2017  PT End of Session - 03/06/17 1233    Visit Number  7    Date for PT Re-Evaluation  03/06/17    Authorization Type  Medicaid 4 visits approved 02/13/17-03/12/17    Authorization - Visit Number  2    Authorization - Number of Visits  4    PT Start Time  1147    PT Stop Time  1233    PT Time Calculation (min)  46 min    Activity Tolerance  Patient tolerated treatment well    Behavior During Therapy  [facility] for tasks assessed/performed       Past Medical History:  Diagnosis Date  . Arthritis   . 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 Carpal tunnel syndrome of right wrist   . Chronic back pain   . Gallstones   . GERD (gastroesophageal reflux disease)    takes Omeprazole daily  . History of migraine   . Migraines   . Morbid obesity (HCC)   . Nausea    takes Phenergan as needed  . Osteoarthritis    takes Relafen daily  . PIH (pregnancy induced hypertension)    takes Coreg,Enalapril,and Amlodipine daily    Past Surgical History:  Procedure Laterality Date  . CARPAL TUNNEL RELEASE Right 08/02/2016   Procedure: CARPAL TUNNEL RELEASE RIGHT;  Surgeon: Coletta Memos, MD;  Location[facility];  Service: Neurosurgery;  Laterality: Right;  right  . CESAREAN SECTION  2011  . CHOLECYSTECTOMY N/A 01/23/2013   Procedure: LAPAROSCOPIC CHOLECYSTECTOMY;  Surgeon: Atilano Ina, MD;  [facility] OR;  Service: General;  Laterality: N/A;  . TUBAL LIGATION  2011    There were no vitals filed for this visit.  Subjective Assessment - 03/06/17 1147    Subjective  I am doing OK with my exercises.  My shoulder is doing better.      Currently in Pain?  Yes    Pain  Score  3     Pain Location  Shoulder    Pain Orientation  Left;Lateral    Pain Type  Chronic pain;Surgical pain    Pain Onset  1 to 4 weeks ago    Pain Frequency  Constant    Aggravating Factors   reaching out to the Lt side, lifting > 5#    Pain Relieving Factors  heat, ice, stretching         [facility] PT Assessment - 03/06/17 0001      Assessment   Medical Diagnosis  Left shoulder scope/SAD/DCR/labral tear       Prior Function   Level of Independence  Independent    Vocation  Unemployed      Cognition   Overall Cognitive Status  Within Functional Limits for tasks assessed      AROM   Left Shoulder Flexion  129 Degrees pain    Left Shoulder ABduction  75 Degrees scapular elevation and pain    Left Shoulder Internal Rotation  -- to Lt buttock    Left Shoulder External Rotation  -- to T2 (lateral)      Strength   Left Shoulder Flexion  4-/5    Left Shoulder ABduction  3+/5    Left Shoulder Internal Rotation  4+/5    Left Shoulder External Rotation  4/5                  [facility] Adult PT Treatment/Exercise - 03/06/17 0001      Shoulder Exercises: Seated   Flexion  Strengthening;Left;20 reps;Weights tactile cues to reduce substitution    Flexion Weight (lbs)  1    Other Seated Exercises  scaption 1# 2x10      Shoulder Exercises: Prone   Other Prone Exercises  I's T's Y's 2 x 10 with 2# within available A/ROM      Shoulder Exercises: Sidelying   External Rotation  Strengthening;Left;20 reps;Weights    External Rotation Weight (lbs)  2    ABduction  Strengthening;Left;20 reps;Weights    ABduction Weight (lbs)  2      Shoulder Exercises: Pulleys   Flexion  3 minutes      Shoulder Exercises: ROM/Strengthening   UBE (Upper Arm Bike)  L2 x 6min changing direction at 3 min.  PT present to discuss goals    Other ROM/Strengthening Exercises  dowel rod flexion with 3# reaching over head 2 x 10      Manual Therapy   Manual Therapy  Taping    Manual therapy  comments  Kinesiotape applied for Upper trap relaxation and scapular depression    Joint Mobilization  Lt shoulder P/ROM and inferior glides with motion.  All directions to tolerance               PT Short Term Goals - 02/06/17 1115      PT SHORT TERM GOAL #1   Title  Pt. will be I with intial HEP    Time  3    Period  Weeks    Status  Achieved      PT SHORT TERM GOAL #2   Title  Pt. will demonstrate decreased shoulder hiking and proper posture with motion to reduce pain.    Baseline  continues to hike mostly with shoulder abduction with pain rated at 4-6/10    Time  3    Period  Weeks    Status  Achieved        PT Long Term Goals - 03/06/17 1153      PT LONG TERM GOAL #1   Title  Pt. will increase overall shoulder strength in left to >/= 4/5 to allow for better lifting required for ADL's.     Baseline  3+/5 abduction, 4-/5 flexion, 4/5 IR, 4-/5 ER    Time  4    Period  Weeks    Status  On-going    Target Date  04/03/17      PT LONG TERM GOAL #2   Title  Pt. will increase shoulder flexion/abuction/ER >/= 25 degrees with </= 2/10 pain to allow for proper functional use of LUE for activities such as grooming.    Baseline  abduction 75, IR to Lt buttock, ER to T2    Time  4    Period  Weeks    Status  On-going    Target Date  04/03/17      PT LONG TERM GOAL #3   Title  Pt. will be independent with all prescribed HEP as of final visit.     Baseline  further advancement is needed    Time  4    Period  Weeks    Status  On-going    Target Date  04/03/17      PT LONG  TERM GOAL #4   Title  Pt will be able to lift 10+ lbs. overhead to simulate reaching or potential work related activity.     Baseline  unable to lift >5# due to pain    Time  4    Period  Weeks    Status  On-going    Target Date  04/03/17            Plan - 03/06/17 1157    Clinical Impression Statement  Pt reports 3/10 Lt shoulder pain overall.  Pt is limited with lifting >5# overhead with  Lt UE due to pain and functional weakness and demonstrates Lt shoulder weakness and limited A/ROM.  Pt also reports difficulty with reaching out to the side due to pain and weakness. Pt demonstrates scapular elevation with movement of Lt UE against gravity.  PT provided tactile and demo cues throughout treamtent today.  Pt is making steady progress s/p surgery and further advancement of strength and flexibility are needed for return to prior level of fuction including reaching and lifting overhead, endurance tasks and reaching out to the side.      Rehab Potential  Good    PT Frequency  1x / week    PT Duration  4 weeks    PT Treatment/Interventions  ADLs/Self Care Home Management;Electrical Stimulation;Iontophoresis 4mg /ml Dexamethasone;Moist Heat;Cryotherapy;Therapeutic activities;Therapeutic exercise;Manual techniques;Passive range of motion;Dry needling;Taping    PT Next Visit Plan  UBE, AROM, joint mobiliztion, scapular stability strengthening, lower trap strengthening. promote scapulohumeral rhythm.  2 more Medicaid visits remain.      Consulted and Agree with Plan of Care  Patient       Patient will benefit from skilled therapeutic intervention in order to improve the following deficits and impairments:  Pain, Hypomobility, Decreased strength, Decreased range of motion, Increased muscle spasms  Visit Diagnosis: Chronic left shoulder pain - Plan: PT plan of care cert/re-cert  Stiffness of left shoulder, not elsewhere classified - Plan: PT plan of care cert/re-cert  Muscle weakness (generalized) - Plan: PT plan of care cert/re-cert     Problem List Patient Active Problem List   Diagnosis Date Noted  . Pulmonary embolus (HCC) 10/31/2016  . Chronic diastolic CHF (congestive heart failure) (HCC) 10/31/2016  . Normocytic anemia 10/31/2016  . Congestive dilated cardiomyopathy (HCC) 10/10/2016  . Preop cardiovascular exam 10/10/2016  . Vaginal itching 04/04/2016  . Dysuria 04/04/2016   . Mild dysplasia of cervix 11/24/2012  . Papanicolaou smear of cervix with low grade squamous intraepithelial lesion (LGSIL) 10/21/2012  . HTN (hypertension) 12/10/2011  . Fatigue 03/04/2010  . OBESITY, MORBID 02/10/2010  . ESSENTIAL HYPERTENSION, BENIGN 07/20/2009  . Secondary cardiomyopathy, unspecified 07/20/2009  . EDEMA 07/20/2009     Lorrene Reid, PT 03/06/17 12:35 PM  [facility] [address] [place], [place], [address] Phone: [phone]   Fax:  [phone]  Name: ADRIANN THAU MRN: [id number] Date of Birth: 1980/10/22

## 2017-03-08 ENCOUNTER — Encounter: Payer: Medicaid Other | Admitting: Physical Therapy

## 2017-03-11 ENCOUNTER — Ambulatory Visit: Payer: Medicaid Other | Attending: Specialist | Admitting: Physical Therapy

## 2017-03-11 ENCOUNTER — Encounter: Payer: Self-pay | Admitting: Physical Therapy

## 2017-03-11 DIAGNOSIS — M6281 Muscle weakness (generalized): Secondary | ICD-10-CM | POA: Insufficient documentation

## 2017-03-11 DIAGNOSIS — M25512 Pain in left shoulder: Secondary | ICD-10-CM | POA: Diagnosis present

## 2017-03-11 DIAGNOSIS — M25612 Stiffness of left shoulder, not elsewhere classified: Secondary | ICD-10-CM | POA: Insufficient documentation

## 2017-03-11 DIAGNOSIS — M79641 Pain in right hand: Secondary | ICD-10-CM

## 2017-03-11 DIAGNOSIS — G8929 Other chronic pain: Secondary | ICD-10-CM | POA: Diagnosis present

## 2017-03-11 NOTE — Addendum Note (Signed)
Addended by: Milford CageLEAMON, Daimen Shovlin L on: 03/11/2017 05:51 PM   Modules accepted: Orders

## 2017-03-11 NOTE — Therapy (Signed)
[facility] Outpatient Rehabilitation [facility] [address] [place], [place], [address] Phone: [phone]   Fax:  [phone]  Physical Therapy Treatment / Re-certification  Patient Details  Name: Allison [name] MRN: [id number] Date of Birth: 12-13-1980 Referring Provider: R. Valma Cava, MD   Encounter Date: 03/11/2017  PT End of Session - 03/11/17 1645    Visit Number  8    Number of Visits  12    Date for PT Re-Evaluation  04/08/17    PT Start Time  1632    PT Stop Time  1723    PT Time Calculation (min)  51 min    Activity Tolerance  Patient tolerated treatment well    Behavior During Therapy  [facility] for tasks assessed/performed       Past Medical History:  Diagnosis Date  . Arthritis   . 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 Carpal tunnel syndrome of right wrist   . Chronic back pain   . Gallstones   . GERD (gastroesophageal reflux disease)    takes Omeprazole daily  . History of migraine   . Migraines   . Morbid obesity (HCC)   . Nausea    takes Phenergan as needed  . Osteoarthritis    takes Relafen daily  . PIH (pregnancy induced hypertension)    takes Coreg,Enalapril,and Amlodipine daily    Past Surgical History:  Procedure Laterality Date  . CARPAL TUNNEL RELEASE Right 08/02/2016   Procedure: CARPAL TUNNEL RELEASE RIGHT;  Surgeon: Coletta Memos, MD;  Location: [facility] OR;  Service: Neurosurgery;  Laterality: Right;  right  . CESAREAN SECTION  2011  . CHOLECYSTECTOMY N/A 01/23/2013   Procedure: LAPAROSCOPIC CHOLECYSTECTOMY;  Surgeon: Atilano Ina, MD;  Location: [facility] OR;  Service: General;  Laterality: N/A;  . TUBAL LIGATION  2011    There were no vitals filed for this visit.  Subjective Assessment - 03/11/17 1636    Subjective  " I amd having good and bad days"    Currently in Pain?  Yes    Pain Score  6     Pain Location  Shoulder    Pain Orientation  Left    Pain Onset  More than a month ago    Pain Frequency   Intermittent    Aggravating Factors   cold, laying on the L side, lifting something heavy     Pain Relieving Factors  heat, stretching,          [facility] PT Assessment - 03/11/17 1637      AROM   Left Shoulder Extension  52 Degrees    Left Shoulder Flexion  132 Degrees    Left Shoulder ABduction  88 Degrees    Left Shoulder Internal Rotation  -- L1     Left Shoulder External Rotation  55 Degrees      Strength   Left Shoulder Flexion  4-/5    Left Shoulder Extension  4+/5    Left Shoulder ABduction  4-/5    Left Shoulder Internal Rotation  4+/5    Left Shoulder External Rotation  4+/5                  [facility] Adult PT Treatment/Exercise - 03/11/17 1655      Shoulder Exercises: Seated   Other Seated Exercises  scaption 1# 2x10    Other Seated Exercises  table dips while seated on the table 2 x 15      Shoulder Exercises: Standing   External Rotation  10 reps;Left;Strengthening    Theraband Level (Shoulder External Rotation)  Level 3 (Green)    Internal Rotation  10 reps;Theraband;Strengthening;Left    Row  Strengthening;Both;10 reps;Theraband    Theraband Level (Shoulder Row)  Level 3 (Green)    Other Standing Exercises  reaching into cabinet 5 x each shelf with 5#, 2 sets      Shoulder Exercises: ROM/Strengthening   UBE (Upper Arm Bike)  L2 x 6min      Shoulder Exercises: Stretch   Other Shoulder Stretches  upper trap stretch 2 x 30 sec hold      Moist Heat Therapy   Number Minutes Moist Heat  5 Minutes    Moist Heat Location  Shoulder      Manual Therapy   Manual therapy comments  wrap using theraband creating figure 8 brace which she reported signifcant relief of pain with LUE movement               PT Short Term Goals - 02/06/17 1115      PT SHORT TERM GOAL #1   Title  Pt. will be I with intial HEP    Time  3    Period  Weeks    Status  Achieved      PT SHORT TERM GOAL #2   Title  Pt. will demonstrate decreased shoulder hiking and proper  posture with motion to reduce pain.    Baseline  continues to hike mostly with shoulder abduction with pain rated at 4-6/10    Time  3    Period  Weeks    Status  Achieved        PT Long Term Goals - 03/11/17 1641      PT LONG TERM GOAL #1   Title  Pt. will increase overall shoulder strength in left to >/= 4/5 to allow for better lifting required for ADL's.     Baseline  UPDATED STATUS: flexion 4-/5, abduction 4-/5 with 6/10 pain today    Time  4    Period  Weeks    Status  On-going    Target Date  04/03/17      PT LONG TERM GOAL #2   Title  Pt. will increase shoulder flexion/abuction/ER >/= 25 degrees with </= 2/10 pain to allow for proper functional use of LUE for activities such as grooming.    Baseline  UPDATED STATUS: abd 88, flexion 132, ER 55 degrees     Time  4    Period  Weeks    Status  On-going    Target Date  04/03/17      PT LONG TERM GOAL #3   Title  Pt. will be independent with all prescribed HEP as of final visit.     Baseline  UPDATED STATUS:  independent with current HEP, progresssing with home program as tolerated.     Time  4    Period  Weeks    Status  On-going    Target Date  04/03/17      PT LONG TERM GOAL #4   Title  Pt will be able to lift 10+ lbs. overhead to simulate reaching or potential work related activity.     Baseline  unable to lift >5# due to pain    Time  4    Period  Weeks    Status  On-going    Target Date  04/03/17            Plan - 03/11/17 1728  Clinical Impression Statement  pt reports today 6/10 pain today but reports she has been consistent with her HEP. She is makeing progress with shoulder mobility and strength, but continues to have difficulty with abduction/ flexion reaching overhead, external rotation strength and popping noted at the A/C with pain. She continues to make progress with LTG, and continues to demonstrate motivation to continue with PT and consistency with her HEP to improve. she would benefit from 1 x  a week for 4 weeks to work toward remaining goals, promote shoulder mobility, scapulohumeral stability/ rhtym to reduce pain, and work toward remaining goals and Independent exercise/ discharge.    Rehab Potential  Good    PT Frequency  1x / week    PT Duration  4 weeks    PT Treatment/Interventions  ADLs/Self Care Home Management;Electrical Stimulation;Iontophoresis 4mg /ml Dexamethasone;Moist Heat;Cryotherapy;Therapeutic activities;Therapeutic exercise;Manual techniques;Passive range of motion;Dry needling;Taping    PT Next Visit Plan  UBE, AROM, joint mobiliztion, scapular stability strengthening, lower trap strengthening. promote scapulohumeral rhythm.  How was figure 8 brace    PT Home Exercise Plan  wand flexion (supine); wand abduction (seated); upper trap stretch, theraband rows ,  pulleys,  ER isometric, internal/ external rotation, wall walks, levator scapulae stretch, wall push-up, and self inferior mobs.     Consulted and Agree with Plan of Care  Patient       Patient will benefit from skilled therapeutic intervention in order to improve the following deficits and impairments:  Pain, Hypomobility, Decreased strength, Decreased range of motion, Increased muscle spasms  Visit Diagnosis: Chronic left shoulder pain  Stiffness of left shoulder, not elsewhere classified  Muscle weakness (generalized)  Pain in right hand     Problem List Patient Active Problem List   Diagnosis Date Noted  . Pulmonary embolus (HCC) 10/31/2016  . Chronic diastolic CHF (congestive heart failure) (HCC) 10/31/2016  . Normocytic anemia 10/31/2016  . Congestive dilated cardiomyopathy (HCC) 10/10/2016  . Preop cardiovascular exam 10/10/2016  . Vaginal itching 04/04/2016  . Dysuria 04/04/2016  . Mild dysplasia of cervix 11/24/2012  . Papanicolaou smear of cervix with low grade squamous intraepithelial lesion (LGSIL) 10/21/2012  . HTN (hypertension) 12/10/2011  . Fatigue 03/04/2010  . OBESITY, MORBID  02/10/2010  . ESSENTIAL HYPERTENSION, BENIGN 07/20/2009  . Secondary cardiomyopathy, unspecified 07/20/2009  . EDEMA 07/20/2009   Lulu Riding PT, DPT, LAT, ATC  03/11/17  5:35 PM      [facility] Outpatient Rehabilitation [facility] [address] [place], [place], [address] Phone: [phone]   Fax:  [phone]  Name: Allison [name] MRN: [id number] Date of Birth: 1980/05/25

## 2017-03-25 ENCOUNTER — Ambulatory Visit: Payer: Medicaid Other | Admitting: Physical Therapy

## 2017-04-01 ENCOUNTER — Ambulatory Visit: Payer: Medicaid Other | Admitting: Physical Therapy

## 2017-04-01 ENCOUNTER — Encounter: Payer: Self-pay | Admitting: Physical Therapy

## 2017-04-01 DIAGNOSIS — G8929 Other chronic pain: Secondary | ICD-10-CM

## 2017-04-01 DIAGNOSIS — M25612 Stiffness of left shoulder, not elsewhere classified: Secondary | ICD-10-CM

## 2017-04-01 DIAGNOSIS — M25512 Pain in left shoulder: Secondary | ICD-10-CM | POA: Diagnosis not present

## 2017-04-01 DIAGNOSIS — M6281 Muscle weakness (generalized): Secondary | ICD-10-CM

## 2017-04-01 NOTE — Therapy (Addendum)
[place], [place], [address] Phone: [phone]   Fax:  [phone]  Physical Therapy Treatment / Discharge Summary  Patient Details  Name: Allison [name] MRN: [id number] Date of Birth: [birth year] Referring Provider: R. Hart Robinsons, MD   Encounter Date: 04/01/2017  PT End of Session - 04/01/17 1437    Visit Number  9    Number of Visits  12    Date for PT Re-Evaluation  04/08/17    Authorization Type  Medicaid 4 visits approved 02/13/17-03/12/17    PT Start Time  1418    PT Stop Time  1458    PT Time Calculation (min)  40 min    Activity Tolerance  Patient tolerated treatment well    Behavior During Therapy  [facility] for tasks assessed/performed       Past Medical History:  Diagnosis Date  . Arthritis   . 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 Carpal tunnel syndrome of right wrist   . Chronic back pain   . Gallstones   . GERD (gastroesophageal reflux disease)    takes Omeprazole daily  . History of migraine   . Migraines   . Morbid obesity ([place])   . Nausea    takes Phenergan as needed  . Osteoarthritis    takes Relafen daily  . PIH (pregnancy induced hypertension)    takes Coreg,Enalapril,and Amlodipine daily    Past Surgical History:  Procedure Laterality Date  . CARPAL TUNNEL RELEASE Right 08/02/2016   Procedure: CARPAL TUNNEL RELEASE RIGHT;  Surgeon: Ashok Pall, MD;  Location: [facility];  Service: Neurosurgery;  Laterality: Right;  right  . CESAREAN SECTION  2011  . CHOLECYSTECTOMY N/A 01/23/2013   Procedure: LAPAROSCOPIC CHOLECYSTECTOMY;  Surgeon: Gayland Curry, MD;  Location: [place];  Service: General;  Laterality: N/A;  . TUBAL LIGATION  2011    There were no vitals filed for this visit.  Subjective Assessment - 04/01/17 1422    Subjective  "the pain today is about a 4/10, but it is getting better. and I order a figure 8 brace"     Currently in Pain?  Yes    Pain Score  4      Pain Orientation  Left    Pain Descriptors / Indicators  Tightness;Throbbing    Pain Type  Chronic pain    Pain Onset  More than a month ago    Pain Frequency  Intermittent    Aggravating Factors   using the L arm    Pain Relieving Factors  heat, stretching,                 No data recorded       [facility] Adult PT Treatment/Exercise - 04/01/17 1425      Shoulder Exercises: Supine   Other Supine Exercises  D2 2 x 10 with red theraband      Shoulder Exercises: Seated   Other Seated Exercises  table dips 2 x 10 pushing from arm rest verbal/ visual cues for proper form      Shoulder Exercises: ROM/Strengthening   UBE (Upper Arm Bike)  L3 x 61mn changing direction at 3 min      Shoulder Exercises: Power TPublic affairs consultanthigh row 2 x 10 7# bil UE    Other Power Tower Exercises  chest press 3 x 10 25#      Shoulder Exercises: Body Blade   Other Body  Blade Exercises  IR/ER 2 x 30 sec    Other Body Blade Exercises  sustained protraction 3 x 15 sec oscilalting with add/ abd             PT Education - 04/01/17 1436    Education provided  Yes    Education Details  updated HEP     Person(s) Educated  Patient    Methods  Explanation;Verbal cues;Handout    Comprehension  Verbalized understanding;Verbal cues required       PT Short Term Goals - 02/06/17 1115      PT SHORT TERM GOAL #1   Title  Pt. will be I with intial HEP    Time  3    Period  Weeks    Status  Achieved      PT SHORT TERM GOAL #2   Title  Pt. will demonstrate decreased shoulder hiking and proper posture with motion to reduce pain.    Baseline  continues to hike mostly with shoulder abduction with pain rated at 4-6/10    Time  3    Period  Weeks    Status  Achieved        PT Long Term Goals - 03/11/17 1641      PT LONG TERM GOAL #1   Title  Pt. will increase overall shoulder strength in left to >/= 4/5 to allow for better lifting required for ADL's.      Baseline  UPDATED STATUS: flexion 4-/5, abduction 4-/5 with 6/10 pain today    Time  4    Period  Weeks    Status  On-going    Target Date  04/03/17      PT LONG TERM GOAL #2   Title  Pt. will increase shoulder flexion/abuction/ER >/= 25 degrees with </= 2/10 pain to allow for proper functional use of LUE for activities such as grooming.    Baseline  UPDATED STATUS: abd 88, flexion 132, ER 55 degrees     Time  4    Period  Weeks    Status  On-going    Target Date  04/03/17      PT LONG TERM GOAL #3   Title  Pt. will be independent with all prescribed HEP as of final visit.     Baseline  UPDATED STATUS:  independent with current HEP, progresssing with home program as tolerated.     Time  4    Period  Weeks    Status  On-going    Target Date  04/03/17      PT LONG TERM GOAL #4   Title  Pt will be able to lift 10+ lbs. overhead to simulate reaching or potential work related activity.     Baseline  unable to lift >5# due to pain    Time  4    Period  Weeks    Status  On-going    Target Date  04/03/17            Plan - 04/01/17 1458    Clinical Impression Statement  pt reports 3-4/10 pain today located at the A/C joint. cointinued workiong on shoulder stability exericises and progress with pec major/ minor strengtheing and dip strengthening to promote activation of muscle to keep the clavicle down. end of session she reported reduce pain with reaching overhead and declined modalities.     PT Next Visit Plan  UBE, AROM, joint mobiliztion, scapular stability strengthening, lower trap strengthening. promote scapulohumeral rhythm.  did  she get her figure 8 brace    PT Home Exercise Plan  wand flexion (supine); wand abduction (seated); upper trap stretch, theraband rows ,  pulleys,  ER isometric, internal/ external rotation, wall walks, levator scapulae stretch, wall push-up, and self inferior mobs. chair dips, D2 pattern     Consulted and Agree with Plan of Care  Patient        Patient will benefit from skilled therapeutic intervention in order to improve the following deficits and impairments:  Pain, Hypomobility, Decreased strength, Decreased range of motion, Increased muscle spasms  Visit Diagnosis: Chronic left shoulder pain  Stiffness of left shoulder, not elsewhere classified  Muscle weakness (generalized)     Problem List Patient Active Problem List   Diagnosis Date Noted  . Pulmonary embolus ([place]) 10/31/2016  . Chronic diastolic CHF (congestive heart failure) ([place]) 10/31/2016  . Normocytic anemia 10/31/2016  . Congestive dilated cardiomyopathy ([place]) 10/10/2016  . Preop cardiovascular exam 10/10/2016  . Vaginal itching 04/04/2016  . Dysuria 04/04/2016  . Mild dysplasia of cervix 11/24/2012  . Papanicolaou smear of cervix with low grade squamous intraepithelial lesion (LGSIL) 10/21/2012  . HTN (hypertension) 12/10/2011  . Fatigue 03/04/2010  . OBESITY, MORBID 02/10/2010  . ESSENTIAL HYPERTENSION, BENIGN 07/20/2009  . Secondary cardiomyopathy, unspecified 07/20/2009  . EDEMA 07/20/2009   Starr Lake PT, DPT, LAT, ATC  04/01/17  3:01 PM      [place] [facility] [address] [place], [place], [address] Phone: [phone]   Fax:  [phone]  Name: Allison [name] MRN: [id number] Date of Birth: January 04, 1981        PHYSICAL THERAPY DISCHARGE SUMMARY  Visits from Start of Care: 9  Current functional level related to goals / functional outcomes: See goals   Remaining deficits: As of last attended visit pt was progressing well, current status unknown due to pt not returning since last attended visit.    Education / Equipment: HEP, theraband.   Plan: Patient agrees to discharge.  Patient goals were partially met. Patient is being discharged due to not returning since the last visit.  ?????         Amylah Will PT, DPT, LAT, ATC  05/21/17  11:44 AM

## 2017-04-08 ENCOUNTER — Ambulatory Visit: Payer: Medicaid Other | Admitting: Physical Therapy

## 2017-04-15 ENCOUNTER — Ambulatory Visit: Payer: Medicaid Other | Attending: Specialist | Admitting: Physical Therapy

## 2017-04-15 ENCOUNTER — Telehealth: Payer: Self-pay | Admitting: Physical Therapy

## 2017-04-15 NOTE — Telephone Encounter (Signed)
LVM regarding miss appointment today and her last missed appointment. Today is her last scheduled visit, which if she no longer feels she needs physical therapy we can discharge her. If she feels she needs to schedule a visit to call us back otherwise we will plan to discharge.

## 2017-10-08 ENCOUNTER — Ambulatory Visit (INDEPENDENT_AMBULATORY_CARE_PROVIDER_SITE_OTHER): Payer: Medicaid Other | Admitting: Obstetrics

## 2017-10-08 ENCOUNTER — Other Ambulatory Visit (HOSPITAL_COMMUNITY)
Admission: RE | Admit: 2017-10-08 | Discharge: 2017-10-08 | Disposition: A | Discharge status: Home / Self Care | Payer: Medicaid Other | Source: Ambulatory Visit | Attending: Obstetrics | Admitting: Obstetrics

## 2017-10-08 ENCOUNTER — Encounter: Payer: Self-pay | Admitting: Obstetrics

## 2017-10-08 VITALS — Wt 287.0 lb

## 2017-10-08 DIAGNOSIS — Z0000 Encounter for general adult medical examination without abnormal findings: Secondary | ICD-10-CM | POA: Diagnosis not present

## 2017-10-08 DIAGNOSIS — Z01419 Encounter for gynecological examination (general) (routine) without abnormal findings: Secondary | ICD-10-CM | POA: Insufficient documentation

## 2017-10-08 DIAGNOSIS — M549 Dorsalgia, unspecified: Secondary | ICD-10-CM | POA: Insufficient documentation

## 2017-10-08 DIAGNOSIS — Z6841 Body Mass Index (BMI) 40.0 and over, adult: Secondary | ICD-10-CM

## 2017-10-08 DIAGNOSIS — Z79899 Other long term (current) drug therapy: Secondary | ICD-10-CM | POA: Insufficient documentation

## 2017-10-08 DIAGNOSIS — K219 Gastro-esophageal reflux disease without esophagitis: Secondary | ICD-10-CM | POA: Insufficient documentation

## 2017-10-08 DIAGNOSIS — M1990 Unspecified osteoarthritis, unspecified site: Secondary | ICD-10-CM | POA: Insufficient documentation

## 2017-10-08 DIAGNOSIS — G43909 Migraine, unspecified, not intractable, without status migrainosus: Secondary | ICD-10-CM | POA: Insufficient documentation

## 2017-10-08 DIAGNOSIS — G8929 Other chronic pain: Secondary | ICD-10-CM | POA: Insufficient documentation

## 2017-10-08 NOTE — Progress Notes (Signed)
Subjective:        Allison [name] is a 37 y.o. female here for a routine exam.  Current complaints: NONE.    Personal health questionnaire:  Is patient [name], have a family history of breast and/or ovarian cancer: yes, pGM Is there a family history of uterine cancer diagnosed at age < 42, gastrointestinal cancer, urinary tract cancer, family member who is a Personnel officer syndrome-associated carrier: no Is the patient overweight and hypertensive, family history of diabetes, personal history of gestational diabetes, preeclampsia or PCOS: no Is patient over 71, have PCOS,  family history of premature CHD under age 32, diabetes, smoke, have hypertension or peripheral artery disease:  no At any time, has a partner hit, kicked or otherwise hurt or frightened you?: no Over the past 2 weeks, have you felt down, depressed or hopeless?: no Over the past 2 weeks, have you felt little interest or pleasure in doing things?:no   Gynecologic History Patient's last menstrual period was 09/16/2017 (exact date). Contraception: tubal ligation Last Pap: 2018. Results were: normal Last mammogram: n/a. Results were: n/a  Obstetric History OB History  Gravida Para Term Preterm AB Living  3 3 3     3   SAB TAB Ectopic Multiple Live Births          3    # Outcome Date GA Lbr Len/2nd Weight Sex Delivery Anes PTL Lv  3 Term 06/28/09 [redacted]w[redacted]d  5 lb (2.268 kg) M CS-LTranv EPI N LIV     Birth Comments: PIH, Congestive Heart Failure  2 Term 04/16/05 [redacted]w[redacted]d  7 lb 8 oz (3.402 kg) F Vag-Spont EPI N LIV  1 Term 02/06/01 [redacted]w[redacted]d  5 lb 7 oz (2.466 kg) F Vag-Spont EPI N LIV    Past Medical History:  Diagnosis Date  . Arthritis   . 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 Carpal tunnel syndrome of right wrist   . Chronic back pain   . Gallstones   . GERD (gastroesophageal reflux disease)    takes Omeprazole daily  . History of migraine   . Migraines   . Morbid obesity (HCC)   . Nausea    takes  Phenergan as needed  . Osteoarthritis    takes Relafen daily  . PIH (pregnancy induced hypertension)    takes Coreg,Enalapril,and Amlodipine daily    Past Surgical History:  Procedure Laterality Date  . CARPAL TUNNEL RELEASE Right 08/02/2016   Procedure: CARPAL TUNNEL RELEASE RIGHT;  Surgeon: Coletta Memos, MD;  Location: [facility] OR;  Service: Neurosurgery;  Laterality: Right;  right  . CESAREAN SECTION  2011  . CHOLECYSTECTOMY N/A 01/23/2013   Procedure: LAPAROSCOPIC CHOLECYSTECTOMY;  Surgeon: Atilano Ina, MD;  Location: [facility] OR;  Service: General;  Laterality: N/A;  . TUBAL LIGATION  2011     Current Outpatient Medications:  .  amitriptyline (ELAVIL) 25 MG tablet, Take 25 mg by mouth at bedtime., Disp: , Rfl:  .  amLODipine (NORVASC) 5 MG tablet, Take 5 mg by mouth daily after breakfast. , Disp: , Rfl: 3 .  baclofen (LIORESAL) 10 MG tablet, Take 10 mg by mouth every 8 (eight) hours as needed for muscle spasms. , Disp: , Rfl: 1 .  carvedilol (COREG) 25 MG tablet, Take 1 tablet (25 mg total) by mouth 2 (two) times daily with a meal., Disp: 180 tablet, Rfl: 3 .  [employer] D3 1000 units capsule, Take 1,000 Units by mouth daily after breakfast. , Disp: , Rfl: 1 .  cyclobenzaprine (FLEXERIL) 5 MG tablet, Take 5 mg by mouth 3 (three) times daily. , Disp: , Rfl: 2 .  DENTA 5000 PLUS 1.1 % CREA dental cream, Take 1 application by mouth daily., Disp: , Rfl: 6 .  diclofenac (VOLTAREN) 75 MG EC tablet, Take 75 mg by mouth 2 (two) times daily., Disp: , Rfl: 2 .  enalapril (VASOTEC) 10 MG tablet, Take 10 mg by mouth 2 (two) times daily. , Disp: , Rfl:  .  furosemide (LASIX) 40 MG tablet, Take 40 mg by mouth daily with breakfast., Disp: , Rfl:  .  gabapentin (NEURONTIN) 300 MG capsule, Take 300 mg by mouth 3 (three) times daily. , Disp: , Rfl:  .  Melatonin 3 MG CAPS, Take 1 capsule (3 mg total) by mouth at bedtime., Disp: , Rfl: 0 .  omeprazole (PRILOSEC) 20 MG capsule, Take 20 mg by mouth daily before  breakfast. , Disp: , Rfl:  .  oxyCODONE (OXY IR/ROXICODONE) 5 MG immediate release tablet, Take 5 mg by mouth every 4 (four) hours as needed for moderate pain or severe pain. for pain, Disp: , Rfl: 0 .  oxyCODONE-acetaminophen (PERCOCET) 10-325 MG per tablet, Take 1 tablet by mouth every 6 (six) hours. , Disp: , Rfl: 0 .  Rivaroxaban 15 & 20 MG TBPK, Take as directed on package: Start with one 15mg  tablet by mouth twice a day with food. On Day 22, switch to one 20mg  tablet once a day with food.  Please note that first dose of Rivaroxaban was given on 10/31/2016, Disp: 51 each, Rfl: 0 .  SUMAtriptan (IMITREX) 50 MG tablet, Take 50 mg by mouth daily as needed for migraine. May repeat in 2 hours if headache persists or recurs. , Disp: , Rfl:  .  topiramate (TOPAMAX) 100 MG tablet, Take 100 mg by mouth 2 (two) times daily., Disp: , Rfl:  .  ferrous sulfate 325 (65 FE) MG EC tablet, Take 1 tablet by mouth daily., Disp: , Rfl: 2 Allergies  Allergen Reactions  . Cymbalta [Duloxetine Hcl] Other (See Comments)    Paranoia/nauseous/anxious feeling.  . Reglan [Metoclopramide] Anxiety    Social History   Tobacco Use  . Smoking status: Never Smoker  . Smokeless tobacco: Never Used  Substance Use Topics  . Alcohol use: No    Alcohol/week: 0.0 standard drinks    Family History  Problem Relation Age of Onset  . Hypertension Mother   . Diabetes Mother   . Heart disease Mother   . Miscarriages / [place] Mother   . COPD Mother   . Arthritis Mother   . Alcohol abuse Father   . Asthma Father   . Diabetes Maternal Grandmother   . Heart disease Maternal Grandmother   . Cancer Maternal Grandfather        leukemia  . Arthritis Maternal Grandfather   . Cancer Paternal Grandmother        breast  . Cancer Paternal Grandfather        leukemia      Review of Systems  Constitutional: negative for fatigue and weight loss Respiratory: negative for cough and wheezing Cardiovascular: negative for  chest pain, fatigue and palpitations Gastrointestinal: negative for abdominal pain and change in bowel habits Musculoskeletal:negative for myalgias Neurological: negative for gait problems and tremors Behavioral/Psych: negative for abusive relationship, depression Endocrine: negative for temperature intolerance    Genitourinary:negative for abnormal menstrual periods, genital lesions, hot flashes, sexual problems and vaginal discharge Integument/breast: negative for breast lump,  breast tenderness, nipple discharge and skin lesion(s)    Objective:       Wt 287 lb (130.2 kg)   LMP 09/16/2017 (Exact Date)   BMI 47.76 kg/m  General:   alert  Skin:   no rash or abnormalities  Lungs:   clear to auscultation bilaterally  Heart:   regular rate and rhythm, S1, S2 normal, no murmur, click, rub or gallop  Breasts:   normal without suspicious masses, skin or nipple changes or axillary nodes  Abdomen:  normal findings: no organomegaly, soft, non-tender and no hernia  Pelvis:  External genitalia: normal general appearance Urinary system: urethral meatus normal and bladder without fullness, nontender Vaginal: normal without tenderness, induration or masses Cervix: normal appearance Adnexa: normal bimanual exam Uterus: anteverted and non-tender, normal size   Lab Review Urine pregnancy test Labs reviewed yes Radiologic studies reviewed no  50% of 20 min visit spent on counseling and coordination of care.   Assessment:     1. Encounter for routine gynecological examination with Papanicolaou smear of cervix Rx: - Cytology - PAP  2. Class 3 severe obesity due to excess calories without serious comorbidity with body mass index (BMI) of 45.0 to 49.9 in adult [facility]) - program of caloric reduction, exercise and behavioral modification recommended   Plan:    Education reviewed: calcium supplements, depression evaluation, low fat, low cholesterol diet, safe sex/STD prevention, self breast exams  and weight bearing exercise. Follow up in: 1 year.   No orders of the defined types were placed in this encounter.  No orders of the defined types were placed in this encounter.   Brock Bad MD 10-08-2017

## 2017-10-08 NOTE — Progress Notes (Signed)
Patient presents for Annual Exam today.  Last pap: 10/01/2016 WNL   LMP: 09/16/17 -09/19/17 Contraception: BTL  STD Screening:Declines same partner declines swab for BV    c/o: None

## 2017-10-10 LAB — CYTOLOGY - PAP
DIAGNOSIS: NEGATIVE
HPV: NOT DETECTED

## 2017-11-06 NOTE — Progress Notes (Signed)
HPI The patient presents for followup of nonischemic cardiomyopathy with an ejection fraction as low as 25% followup echo in 2012 and echo in Dec 2014 demonstrated it was up to 60%.  Since I last saw her she ws in the hospital last Oct with dyspnea and a DVT and PE.   This happened after she had had shoulder surgery.  She is done well since I saw her otherwise. The patient denies any new symptoms such as chest discomfort, neck or arm discomfort. There has been no new shortness of breath, PND or orthopnea. There have been no reported palpitations, presyncope or syncope.      Allergies  Allergen Reactions  . Cymbalta [Duloxetine Hcl] Other (See Comments)    Paranoia/nauseous/anxious feeling.  . Reglan [Metoclopramide] Anxiety    Current Outpatient Medications  Medication Sig Dispense Refill  . amitriptyline (ELAVIL) 25 MG tablet Take 25 mg by mouth at bedtime.    Marland Kitchen amLODipine (NORVASC) 5 MG tablet Take 2.5 mg by mouth daily after breakfast.   3  . baclofen (LIORESAL) 10 MG tablet Take 10 mg by mouth every 8 (eight) hours as needed for muscle spasms.   1  . carvedilol (COREG) 25 MG tablet Take 1 tablet (25 mg total) by mouth 2 (two) times daily with a meal. 180 tablet 3  . CVS D3 1000 units capsule Take 1,000 Units by mouth daily after breakfast.   1  . cyclobenzaprine (FLEXERIL) 5 MG tablet Take 5 mg by mouth 3 (three) times daily.   2  . DENTA 5000 PLUS 1.1 % CREA dental cream Take 1 application by mouth daily.  6  . diclofenac (VOLTAREN) 75 MG EC tablet Take 75 mg by mouth 2 (two) times daily.  2  . enalapril (VASOTEC) 10 MG tablet Take 10 mg by mouth 2 (two) times daily.     . ferrous sulfate 325 (65 FE) MG EC tablet Take 1 tablet by mouth daily.  2  . furosemide (LASIX) 40 MG tablet Take 40 mg by mouth daily with breakfast.    . gabapentin (NEURONTIN) 300 MG capsule Take 300 mg by mouth 3 (three) times daily.     Marland Kitchen omeprazole (PRILOSEC) 20 MG capsule Take 20 mg by mouth daily before  breakfast.     . oxyCODONE-acetaminophen (PERCOCET) 10-325 MG per tablet Take 1 tablet by mouth every 6 (six) hours.   0  . pregabalin (LYRICA) 25 MG capsule Take 25 mg by mouth 3 (three) times daily.    . SUMAtriptan (IMITREX) 50 MG tablet Take 50 mg by mouth daily as needed for migraine. May repeat in 2 hours if headache persists or recurs.     . topiramate (TOPAMAX) 100 MG tablet Take 100 mg by mouth 2 (two) times daily.     No current facility-administered medications for this visit.     Past Medical History:  Diagnosis Date  . Arthritis   . 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 Carpal tunnel syndrome of right wrist   . Chronic back pain   . Gallstones   . GERD (gastroesophageal reflux disease)    takes Omeprazole daily  . History of migraine   . Migraines   . Morbid obesity (HCC)   . Nausea    takes Phenergan as needed  . Osteoarthritis    takes Relafen daily  . PIH (pregnancy induced hypertension)    takes Coreg,Enalapril,and Amlodipine daily    Past Surgical History:  Procedure Laterality Date  .  CARPAL TUNNEL RELEASE Right 08/02/2016   Procedure: CARPAL TUNNEL RELEASE RIGHT;  Surgeon: Coletta Memos, MD;  Location: [facility] OR;  Service: Neurosurgery;  Laterality: Right;  right  . CESAREAN SECTION  2011  . CHOLECYSTECTOMY N/A 01/23/2013   Procedure: LAPAROSCOPIC CHOLECYSTECTOMY;  Surgeon: Atilano Ina, MD;  Location: [facility] OR;  Service: General;  Laterality: N/A;  . TUBAL LIGATION  2011    ROS:   As stated in the HPI and negative for all other systems.   PHYSICAL EXAM  BP 100/60   Pulse 80   Ht 5\' 5"  (1.651 m)   Wt 290 lb (131.5 kg)   BMI 48.26 kg/m   GENERAL:  Well appearing NECK:  No jugular venous distention, waveform within normal limits, carotid upstroke brisk and symmetric, no bruits, no thyromegaly LUNGS:  Clear to auscultation bilaterally CHEST:  Unremarkable HEART:  PMI not displaced or sustained,S1 and S2 within normal limits, no S3, no S4, no  clicks, no rubs, no murmurs ABD:  Flat, positive bowel sounds normal in frequency in pitch, no bruits, no rebound, no guarding, no midline pulsatile mass, no hepatomegaly, no splenomegaly EXT:  2 plus pulses throughout, no edema, no cyanosis no clubbing  EKG:  Sinus rhythm, rate 80, axis within normal limits, intervals within normal limits, no acute ST-T wave changes.  Poor anterior R wave progression.   11/08/2017  ASSESSMENT AND PLAN  CARDIOMYOPATHY:   Her EF was improved last year.  No change in therapy other than as below.   ESSENTIAL HYPERTENSION:  Her BP is low.  She describes a little lightheadedness particularly with change in he position from bending over to standing up.  I think we can reduce her amlodipine to 2.5 mg daily.   OBESITY, MORBID     she understands the need for weight loss with diet and exercise.  No change in therapy.

## 2017-11-08 ENCOUNTER — Ambulatory Visit: Payer: Medicaid Other | Admitting: Cardiology

## 2017-11-08 ENCOUNTER — Encounter: Payer: Self-pay | Admitting: Cardiology

## 2017-11-08 VITALS — BP 100/60 | HR 80 | Ht 65.0 in | Wt 290.0 lb

## 2017-11-08 DIAGNOSIS — I420 Dilated cardiomyopathy: Secondary | ICD-10-CM | POA: Diagnosis not present

## 2017-11-08 DIAGNOSIS — I10 Essential (primary) hypertension: Secondary | ICD-10-CM

## 2017-11-08 NOTE — Patient Instructions (Signed)
Medication Instructions:  DECREASE- Amlodipine 2.5 mg daily  If you need a refill on your cardiac medications before your next appointment, please call your pharmacy.  Labwork: None Ordered  If you have labs (blood work) drawn today and your tests are completely normal, you will receive your results only by: Marland Kitchen MyChart Message (if you have MyChart) OR . A paper copy in the mail If you have any lab test that is abnormal or we need to change your treatment, we will call you to review the results.  Testing/Procedures: None Ordered  Follow-Up: You will need a follow up appointment in 1 Year.  Please call our office 2 months in [phone]) to schedule the (1 Year) appointment.  You may see  DR Antoine Poche or one of the following Advanced Practice Providers on your designated Care Team:   . Joni Reining, DNP, ANP . Rhonda Barrett, PA-C .  Marland Kitchen Corine Shelter, PA-C . Dot Lanes Kroeger, PA-C . Marjie Skiff, PA-C .  Marland Kitchen Azalee Course, PA-C . Micah Flesher, PA-C  At [facility], you and your health needs are our priority.  As part of our continuing mission to provide you with exceptional heart care, we have created designated Provider Care Teams.  These Care Teams include your primary Cardiologist (physician) and Advanced Practice Providers (APPs -  Physician Assistants and Nurse Practitioners) who all work together to provide you with the care you need, when you need it.   Thank you for choosing [facility] at [facility]!!

## 2017-12-04 ENCOUNTER — Other Ambulatory Visit: Payer: Self-pay | Admitting: Cardiology

## 2017-12-04 DIAGNOSIS — I420 Dilated cardiomyopathy: Secondary | ICD-10-CM

## 2018-11-15 NOTE — Progress Notes (Signed)
HPI The patient presents for followup of nonischemic cardiomyopathy with an ejection fraction as low as 25% followup echo in 2012 and echo in Dec 2014 demonstrated it was up to 60%.  Since I last saw her she has done well.  She is going to have laparoscopic shoulder surgery.  She is also looking into bariatric surgery.  She has been limited by a pinched nerve in her back.  This causes pain down both her legs.  She is able to do her activities of daily living. The patient denies any new symptoms such as chest discomfort, neck or arm discomfort. There has been no new shortness of breath, PND or orthopnea. There have been no reported palpitations, presyncope or syncope.    Allergies  Allergen Reactions  . Cymbalta [Duloxetine Hcl] Other (See Comments)    Paranoia/nauseous/anxious feeling.  . Reglan [Metoclopramide] Anxiety    Current Outpatient Medications  Medication Sig Dispense Refill  . amitriptyline (ELAVIL) 25 MG tablet Take 25 mg by mouth at bedtime.    Marland Kitchen amLODipine (NORVASC) 5 MG tablet Take 2.5 mg by mouth daily after breakfast.   3  . carvedilol (COREG) 25 MG tablet TAKE 1 TABLET BY MOUTH TWICE A DAY WITH A MEAL 180 tablet 3  . CVS D3 1000 units capsule Take 1,000 Units by mouth daily after breakfast.   1  . cyclobenzaprine (FLEXERIL) 5 MG tablet Take 5 mg by mouth 3 (three) times daily.   2  . DENTA 5000 PLUS 1.1 % CREA dental cream Take 1 application by mouth daily.  6  . diclofenac (VOLTAREN) 75 MG EC tablet Take 75 mg by mouth 2 (two) times daily.  2  . enalapril (VASOTEC) 10 MG tablet Take 10 mg by mouth 2 (two) times daily.     . ferrous sulfate 325 (65 FE) MG EC tablet Take 1 tablet by mouth daily.  2  . furosemide (LASIX) 40 MG tablet Take 40 mg by mouth daily with breakfast.    . gabapentin (NEURONTIN) 300 MG capsule Take 300 mg by mouth 3 (three) times daily.     Marland Kitchen omeprazole (PRILOSEC) 20 MG capsule Take 20 mg by mouth daily before breakfast.     .  oxyCODONE-acetaminophen (PERCOCET) 10-325 MG per tablet Take 1 tablet by mouth every 6 (six) hours.   0  . pregabalin (LYRICA) 25 MG capsule Take 25 mg by mouth 3 (three) times daily.    . SUMAtriptan (IMITREX) 50 MG tablet Take 50 mg by mouth daily as needed for migraine. May repeat in 2 hours if headache persists or recurs.     . topiramate (TOPAMAX) 100 MG tablet Take 100 mg by mouth 2 (two) times daily.     No current facility-administered medications for this visit.     Past Medical History:  Diagnosis Date  . Arthritis   . 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 Carpal tunnel syndrome of right wrist   . Chronic back pain   . Gallstones   . GERD (gastroesophageal reflux disease)    takes Omeprazole daily  . History of migraine   . Migraines   . Morbid obesity ([place])   . Nausea    takes Phenergan as needed  . Osteoarthritis    takes Relafen daily  . PIH (pregnancy induced hypertension)    takes Coreg,Enalapril,and Amlodipine daily    Past Surgical History:  Procedure Laterality Date  . CARPAL TUNNEL RELEASE Right 08/02/2016   Procedure: CARPAL TUNNEL  RELEASE RIGHT;  Surgeon: Coletta Memos, MD;  Location: [facility] OR;  Service: Neurosurgery;  Laterality: Right;  right  . CESAREAN SECTION  2011  . CHOLECYSTECTOMY N/A 01/23/2013   Procedure: LAPAROSCOPIC CHOLECYSTECTOMY;  Surgeon: Atilano Ina, MD;  Location: [facility] OR;  Service: General;  Laterality: N/A;  . TUBAL LIGATION  2011    ROS:   As stated in the HPI and negative for all other systems.\   PHYSICAL EXAM  BP (!) 130/93   Pulse 93   Temp (!) 96.6 F (35.9 C)   Ht 5\' 5"  (1.651 m)   Wt (!) 319 lb 9.6 oz (145 kg)   SpO2 100%   BMI 53.18 kg/m   GENERAL:  Well appearing NECK:  No jugular venous distention, waveform within normal limits, carotid upstroke brisk and symmetric, no bruits, no thyromegaly LUNGS:  Clear to auscultation bilaterally CHEST:  Unremarkable HEART:  PMI not displaced or sustained,S1 and S2  within normal limits, no S3, no S4, no clicks, no rubs, no murmurs ABD:  Flat, positive bowel sounds normal in frequency in pitch, no bruits, no rebound, no guarding, no midline pulsatile mass, no hepatomegaly, no splenomegaly EXT:  2 plus pulses throughout, no edema, no cyanosis no clubbing   EKG:  Sinus rhythm, rate 89, axis within normal limits, intervals within normal limits, no acute ST-T wave changes.  Poor anterior R wave progression.   11/17/2018  ASSESSMENT AND PLAN  CARDIOMYOPATHY:   Her EF was improved in 2018.  She has no change clinically.  No further imaging.  She will continue on meds as listed.   ESSENTIAL HYPERTENSION:  Her BP is controlled.  The diastolic is actually slightly elevated but she says this is very unusual.  I suspect that she goes for bariatric surgery and loses weight obviously get rid of the amlodipine.  For now she will continue the meds as listed.    OBESITY, MORBID    I am excited that she is been to consider bariatric surgery.  No change in therapy.  PREOP: The patient is having shoulder surgery.  She has had this finding.  No change in therapy.  No further work-up.  She is acceptable risk for the planned procedure.

## 2018-11-17 ENCOUNTER — Other Ambulatory Visit: Payer: Self-pay

## 2018-11-17 ENCOUNTER — Encounter: Payer: Self-pay | Admitting: Cardiology

## 2018-11-17 ENCOUNTER — Ambulatory Visit (INDEPENDENT_AMBULATORY_CARE_PROVIDER_SITE_OTHER): Payer: Medicaid Other | Admitting: Cardiology

## 2018-11-17 VITALS — BP 130/93 | HR 93 | Temp 96.6°F | Ht 65.0 in | Wt 319.6 lb

## 2018-11-17 DIAGNOSIS — I420 Dilated cardiomyopathy: Secondary | ICD-10-CM | POA: Diagnosis not present

## 2018-11-17 DIAGNOSIS — I10 Essential (primary) hypertension: Secondary | ICD-10-CM | POA: Diagnosis not present

## 2018-11-17 NOTE — Patient Instructions (Signed)
Medication Instructions:  NO CHANGE *If you need a refill on your cardiac medications before your next appointment, please call your pharmacy*  Lab Work: If you have labs (blood work) drawn today and your tests are completely normal, you will receive your results only by: Marland Kitchen MyChart Message (if you have MyChart) OR . A paper copy in the mail If you have any lab test that is abnormal or we need to change your treatment, we will call you to review the results.  Follow-Up: At [facility], you and your health needs are our priority.  As part of our continuing mission to provide you with exceptional heart care, we have created designated Provider Care Teams.  These Care Teams include your primary Cardiologist (physician) and Advanced Practice Providers (APPs -  Physician Assistants and Nurse Practitioners) who all work together to provide you with the care you need, when you need it.  Your next appointment:   12 months  The format for your next appointment:   In Person  Provider:   Minus Breeding, MD  Other Instructions BENFOTAMINE

## 2018-11-18 ENCOUNTER — Encounter: Payer: Self-pay | Admitting: Obstetrics

## 2018-11-18 ENCOUNTER — Other Ambulatory Visit (HOSPITAL_COMMUNITY)
Admission: RE | Admit: 2018-11-18 | Discharge: 2018-11-18 | Disposition: A | Discharge status: Home / Self Care | Payer: Medicaid Other | Source: Ambulatory Visit | Attending: Obstetrics | Admitting: Obstetrics

## 2018-11-18 ENCOUNTER — Ambulatory Visit: Payer: Medicaid Other | Admitting: Obstetrics

## 2018-11-18 VITALS — BP 114/80 | HR 91 | Temp 98.2°F | Ht 65.0 in | Wt 318.4 lb

## 2018-11-18 DIAGNOSIS — Z01419 Encounter for gynecological examination (general) (routine) without abnormal findings: Secondary | ICD-10-CM

## 2018-11-18 DIAGNOSIS — Z0000 Encounter for general adult medical examination without abnormal findings: Secondary | ICD-10-CM | POA: Diagnosis not present

## 2018-11-18 DIAGNOSIS — Z6841 Body Mass Index (BMI) 40.0 and over, adult: Secondary | ICD-10-CM

## 2018-11-18 DIAGNOSIS — N898 Other specified noninflammatory disorders of vagina: Secondary | ICD-10-CM

## 2018-11-18 DIAGNOSIS — Z113 Encounter for screening for infections with a predominantly sexual mode of transmission: Secondary | ICD-10-CM

## 2018-11-18 MED ORDER — TINIDAZOLE 500 MG PO TABS: 1000.0000 mg | ORAL_TABLET | Freq: Every day | ORAL | 2 refills | Status: DC

## 2018-11-18 NOTE — Progress Notes (Signed)
Pt presents for annual and all STD testing.  Pt c/o malodorous vaginal discharge.

## 2018-11-18 NOTE — Progress Notes (Signed)
Subjective:        Allison [name] is a 38 y.o. female here for a routine exam.  Current complaints: Malodorous vaginal discharge after menses.    Personal health questionnaire:  Is patient [name], have a family history of breast and/or ovarian cancer: no Is there a family history of uterine cancer diagnosed at age < 14, gastrointestinal cancer, urinary tract cancer, family member who is a Field seismologist syndrome-associated carrier: no Is the patient overweight and hypertensive, family history of diabetes, personal history of gestational diabetes, preeclampsia or PCOS: yes Is patient over 14, have PCOS,  family history of premature CHD under age 28, diabetes, smoke, have hypertension or peripheral artery disease:  yes At any time, has a partner hit, kicked or otherwise hurt or frightened you?: no Over the past 2 weeks, have you felt down, depressed or hopeless?: no Over the past 2 weeks, have you felt little interest or pleasure in doing things?:no   Gynecologic History Patient's last menstrual period was 11/16/2018. Contraception: tubal ligation Last Pap: 10-08-2017. Results were: normal Last mammogram: n/a. Results were: n/a  Obstetric History OB History  Gravida Para Term Preterm AB Living  3 3 3     3   SAB TAB Ectopic Multiple Live Births          3    # Outcome Date GA Lbr Len/2nd Weight Sex Delivery Anes PTL Lv  3 Term 06/28/09 [redacted]w[redacted]d  5 lb (2.268 kg) M CS-LTranv EPI N LIV     Birth Comments: PIH, Congestive Heart Failure  2 Term 04/16/05 [redacted]w[redacted]d  7 lb 8 oz (3.402 kg) F Vag-Spont EPI N LIV  1 Term 02/06/01 [redacted]w[redacted]d  5 lb 7 oz (2.466 kg) F Vag-Spont EPI N LIV    Past Medical History:  Diagnosis Date  . Arthritis   . 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 Carpal tunnel syndrome of right wrist   . Chronic back pain   . Gallstones   . GERD (gastroesophageal reflux disease)    takes Omeprazole daily  . History of migraine   . Migraines   . Morbid obesity  ([place])   . Nausea    takes Phenergan as needed  . Osteoarthritis    takes Relafen daily  . PIH (pregnancy induced hypertension)    takes Coreg,Enalapril,and Amlodipine daily    Past Surgical History:  Procedure Laterality Date  . CARPAL TUNNEL RELEASE Right 08/02/2016   Procedure: CARPAL TUNNEL RELEASE RIGHT;  Surgeon: Ashok Pall, MD;  Location: [facility];  Service: Neurosurgery;  Laterality: Right;  right  . CESAREAN SECTION  2011  . CHOLECYSTECTOMY N/A 01/23/2013   Procedure: LAPAROSCOPIC CHOLECYSTECTOMY;  Surgeon: Gayland Curry, MD;  Location: [facility];  Service: General;  Laterality: N/A;  . TUBAL LIGATION  2011     Current Outpatient Medications:  .  amitriptyline (ELAVIL) 25 MG tablet, Take 25 mg by mouth at bedtime., Disp: , Rfl:  .  amLODipine (NORVASC) 5 MG tablet, Take 2.5 mg by mouth daily after breakfast. , Disp: , Rfl: 3 .  carvedilol (COREG) 25 MG tablet, TAKE 1 TABLET BY MOUTH TWICE A DAY WITH A MEAL, Disp: 180 tablet, Rfl: 3 .  CVS D3 1000 units capsule, Take 1,000 Units by mouth daily after breakfast. , Disp: , Rfl: 1 .  cyclobenzaprine (FLEXERIL) 5 MG tablet, Take 5 mg by mouth 3 (three) times daily. , Disp: , Rfl: 2 .  DENTA 5000 PLUS 1.1 % CREA dental cream,  Take 1 application by mouth daily., Disp: , Rfl: 6 .  diclofenac (VOLTAREN) 75 MG EC tablet, Take 75 mg by mouth 2 (two) times daily., Disp: , Rfl: 2 .  enalapril (VASOTEC) 10 MG tablet, Take 10 mg by mouth 2 (two) times daily. , Disp: , Rfl:  .  ferrous sulfate 325 (65 FE) MG EC tablet, Take 1 tablet by mouth daily., Disp: , Rfl: 2 .  furosemide (LASIX) 40 MG tablet, Take 40 mg by mouth daily with breakfast., Disp: , Rfl:  .  gabapentin (NEURONTIN) 300 MG capsule, Take 300 mg by mouth 3 (three) times daily. , Disp: , Rfl:  .  omeprazole (PRILOSEC) 20 MG capsule, Take 20 mg by mouth daily before breakfast. , Disp: , Rfl:  .  oxyCODONE-acetaminophen (PERCOCET) 10-325 MG per tablet, Take 1 tablet by mouth every 6 (six)  hours. , Disp: , Rfl: 0 .  pregabalin (LYRICA) 25 MG capsule, Take 25 mg by mouth 3 (three) times daily., Disp: , Rfl:  .  SUMAtriptan (IMITREX) 50 MG tablet, Take 50 mg by mouth daily as needed for migraine. May repeat in 2 hours if headache persists or recurs. , Disp: , Rfl:  .  topiramate (TOPAMAX) 100 MG tablet, Take 100 mg by mouth 2 (two) times daily., Disp: , Rfl:  .  tinidazole (TINDAMAX) 500 MG tablet, Take 2 tablets (1,000 mg total) by mouth daily with breakfast., Disp: 10 tablet, Rfl: 2 Allergies  Allergen Reactions  . Cymbalta [Duloxetine Hcl] Other (See Comments)    Paranoia/nauseous/anxious feeling.  . Reglan [Metoclopramide] Anxiety    Social History   Tobacco Use  . Smoking status: Never Smoker  . Smokeless tobacco: Never Used  Substance Use Topics  . Alcohol use: No    Alcohol/week: 0.0 standard drinks    Family History  Problem Relation Age of Onset  . Hypertension Mother   . Diabetes Mother   . Heart disease Mother   . Miscarriages / [place] Mother   . COPD Mother   . Arthritis Mother   . Alcohol abuse Father   . Asthma Father   . Diabetes Maternal Grandmother   . Heart disease Maternal Grandmother   . Cancer Maternal Grandfather        leukemia  . Arthritis Maternal Grandfather   . Cancer Paternal Grandmother        breast  . Cancer Paternal Grandfather        leukemia      Review of Systems  Constitutional: negative for fatigue and weight loss Respiratory: negative for cough and wheezing Cardiovascular: negative for chest pain, fatigue and palpitations Gastrointestinal: negative for abdominal pain and change in bowel habits Musculoskeletal:negative for myalgias Neurological: negative for gait problems and tremors Behavioral/Psych: negative for abusive relationship, depression Endocrine: negative for temperature intolerance    Genitourinary:negative for abnormal menstrual periods, genital lesions, hot flashes, sexual problems.   Positive for  malodorous vaginal discharge after the period Integument/breast: negative for breast lump, breast tenderness, nipple discharge and skin lesion(s)    Objective:       BP 114/80   Pulse 91   Temp 98.2 F (36.8 C)   Ht 5\' 5"  (1.651 m)   Wt (!) 318 lb 6.4 oz (144.4 kg)   LMP 11/16/2018   BMI 52.98 kg/m  General:   alert  Skin:   no rash or abnormalities  Lungs:   clear to auscultation bilaterally  Heart:   regular rate and rhythm, S1, S2  normal, no murmur, click, rub or gallop  Breasts:   normal without suspicious masses, skin or nipple changes or axillary nodes  Abdomen:  normal findings: no organomegaly, soft, non-tender and no hernia  Pelvis:  External genitalia: normal general appearance Urinary system: urethral meatus normal and bladder without fullness, nontender Vaginal: normal without tenderness, induration or masses Cervix: normal appearance Adnexa: normal bimanual exam Uterus: anteverted and non-tender, normal size   Lab Review Urine pregnancy test Labs reviewed yes Radiologic studies reviewed no  50% of 25 min visit spent on counseling and coordination of care.   Assessment:     1. Encounter for routine gynecological examination with Papanicolaou smear of cervix Rx: - Cytology - PAP( [place])  2. Vaginal discharge Rx: - Cervicovaginal ancillary only( [place]) - tinidazole (TINDAMAX) 500 MG tablet; Take 2 tablets (1,000 mg total) by mouth daily with breakfast.  Dispense: 10 tablet; Refill: 2  4. Screen for STD (sexually transmitted disease) Rx: - Hepatitis C antibody - Hepatitis B surface antigen - HIV antibody - RPR  3. Class 3 severe obesity due to excess calories without serious comorbidity with body mass index (BMI) of 50.0 to 59.9 in adult [facility]) - program of caloric restriction, exercise and behavioral modification recommended for weight loss and long term maintenance     Plan:    Education reviewed: calcium supplements, depression  evaluation, low fat, low cholesterol diet, safe sex/STD prevention, self breast exams and weight bearing exercise. Follow up in: 1 year.   Meds ordered this encounter  Medications  . tinidazole (TINDAMAX) 500 MG tablet    Sig: Take 2 tablets (1,000 mg total) by mouth daily with breakfast.    Dispense:  10 tablet    Refill:  2   Orders Placed This Encounter  Procedures  . Hepatitis C antibody  . Hepatitis B surface antigen  . HIV antibody  . RPR    Brock Bad, MD 11/18/2018 3:47 PM

## 2018-11-18 NOTE — H&P (Signed)
Patient's anticipated LOS is less than 2 midnights, meeting these requirements: - Younger than 66 - Lives within 1 hour of care - Has a competent adult at home to recover with post-op recover - NO history of  - Chronic pain requiring opiods  - Diabetes  - Coronary Artery Disease  - Heart failure  - Heart attack  - Stroke  - DVT/VTE  - Cardiac arrhythmia  - Respiratory Failure/COPD  - Renal failure  - Anemia  - Advanced Liver disease       Allison [name] is an 38 y.o. female.    Chief Complaint: right shoulder pain  HPI: Pt is a 38 y.o. female complaining of right shoulder pain for multiple weeks. Pain had continually increased since the beginning. X-rays in the clinic show rotator cuff tear right shoulder. Pt has tried various conservative treatments which have failed to alleviate their symptoms, including injections and therapy. Various options are discussed with the patient. Risks, benefits and expectations were discussed with the patient. Patient understand the risks, benefits and expectations and wishes to proceed with surgery.   PCP:  Jackie Plum, MD  D/C Plans: Home  PMH: Past Medical History:  Diagnosis Date  . Arthritis   . 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 Carpal tunnel syndrome of right wrist   . Chronic back pain   . Gallstones   . GERD (gastroesophageal reflux disease)    takes Omeprazole daily  . History of migraine   . Migraines   . Morbid obesity ([facility])   . Nausea    takes Phenergan as needed  . Osteoarthritis    takes Relafen daily  . PIH (pregnancy induced hypertension)    takes Coreg,Enalapril,and Amlodipine daily    PSH: Past Surgical History:  Procedure Laterality Date  . CARPAL TUNNEL RELEASE Right 08/02/2016   Procedure: CARPAL TUNNEL RELEASE RIGHT;  Surgeon: Coletta Memos, MD;  Location: [facility];  Service: Neurosurgery;  Laterality: Right;  right  . CESAREAN SECTION  2011  . CHOLECYSTECTOMY N/A 01/23/2013   Procedure: LAPAROSCOPIC CHOLECYSTECTOMY;  Surgeon: Atilano Ina, MD;  Location: [facility] OR;  Service: General;  Laterality: N/A;  . TUBAL LIGATION  2011    Social History:  reports that she has never smoked. She has never used smokeless tobacco. She reports that she does not drink alcohol or use drugs.  Allergies:  Allergies  Allergen Reactions  . Cymbalta [Duloxetine Hcl] Other (See Comments)    Paranoia/nauseous/anxious feeling.  . Reglan [Metoclopramide] Anxiety    Medications: No current facility-administered medications for this encounter.    Current Outpatient Medications  Medication Sig Dispense Refill  . amitriptyline (ELAVIL) 25 MG tablet Take 25 mg by mouth at bedtime.    Marland Kitchen amLODipine (NORVASC) 5 MG tablet Take 2.5 mg by mouth daily after breakfast.   3  . carvedilol (COREG) 25 MG tablet TAKE 1 TABLET BY MOUTH TWICE A DAY WITH A MEAL 180 tablet 3  . CVS D3 1000 units capsule Take 1,000 Units by mouth daily after breakfast.   1  . cyclobenzaprine (FLEXERIL) 5 MG tablet Take 5 mg by mouth 3 (three) times daily.   2  . DENTA 5000 PLUS 1.1 % CREA dental cream Take 1 application by mouth daily.  6  . diclofenac (VOLTAREN) 75 MG EC tablet Take 75 mg by mouth 2 (two) times daily.  2  . enalapril (VASOTEC) 10 MG tablet Take 10 mg by mouth 2 (two) times daily.     Marland Kitchen  ferrous sulfate 325 (65 FE) MG EC tablet Take 1 tablet by mouth daily.  2  . furosemide (LASIX) 40 MG tablet Take 40 mg by mouth daily with breakfast.    . gabapentin (NEURONTIN) 300 MG capsule Take 300 mg by mouth 3 (three) times daily.     Marland Kitchen omeprazole (PRILOSEC) 20 MG capsule Take 20 mg by mouth daily before breakfast.     . oxyCODONE-acetaminophen (PERCOCET) 10-325 MG per tablet Take 1 tablet by mouth every 6 (six) hours.   0  . pregabalin (LYRICA) 25 MG capsule Take 25 mg by mouth 3 (three) times daily.    . SUMAtriptan (IMITREX) 50 MG tablet Take 50 mg by mouth daily as needed for migraine. May repeat in 2 hours if  headache persists or recurs.     . topiramate (TOPAMAX) 100 MG tablet Take 100 mg by mouth 2 (two) times daily.      No results found for this or any previous visit (from the past 48 hour(s)). No results found.  ROS: Pain with rom of the right upper extremity  Physical Exam: Alert and oriented 38 y.o. female in no acute distress Cranial nerves 2-12 intact Cervical spine: full rom with no tenderness, nv intact distally Chest: active breath sounds bilaterally, no wheeze rhonchi or rales Heart: regular rate and rhythm, no murmur Abd: non tender non distended with active bowel sounds Hip is stable with rom  Right shoulder with painful rom nv intact distally No rashes or edema Weakness with ER/IR  Assessment/Plan Assessment: right shoulder rotator cuff tear  Plan:  Patient will undergo a right shoulder rotator cuff repair by [facility] Veverly [facility]. Risks benefits and expectations were discussed with the patient. Patient understand risks, benefits and expectations and wishes to proceed. Preoperative templating of the joint replacement has been completed, documented, and submitted to the Operating Room personnel in order to optimize intra-operative equipment management.   Merla Riches PA-C, MPAS [facility] is now [employer] [address] [place], [place], [place] [address] Phone: [phone] [email or web address] Facebook  [employer]

## 2018-11-19 LAB — CERVICOVAGINAL ANCILLARY ONLY
Bacterial Vaginitis (gardnerella): NEGATIVE
Candida Glabrata: NEGATIVE
Candida Vaginitis: NEGATIVE
Chlamydia: NEGATIVE
Comment: NEGATIVE
Comment: NEGATIVE
Comment: NEGATIVE
Comment: NEGATIVE
Comment: NEGATIVE
Comment: NORMAL
Neisseria Gonorrhea: NEGATIVE
Trichomonas: NEGATIVE

## 2018-11-19 LAB — HEPATITIS C ANTIBODY: Hep C Virus Ab: <0.1 s/co ratio (ref 0.0–0.9)

## 2018-11-19 LAB — HIV ANTIBODY (ROUTINE TESTING W REFLEX): HIV Screen 4th Generation wRfx: NONREACTIVE

## 2018-11-19 LAB — HEPATITIS B SURFACE ANTIGEN: Hepatitis B Surface Ag: NEGATIVE

## 2018-11-19 LAB — RPR: RPR Ser Ql: NONREACTIVE

## 2018-11-20 NOTE — Patient Instructions (Signed)
DUE TO COVID-19 ONLY ONE VISITOR IS ALLOWED TO COME WITH YOU AND STAY IN THE WAITING ROOM ONLY DURING PRE OP AND PROCEDURE DAY OF SURGERY. THE 1 VISITOR MAY VISIT WITH YOU AFTER SURGERY IN YOUR PRIVATE ROOM DURING VISITING HOURS ONLY!  YOU NEED TO HAVE A COVID 19 TEST ON___11-13-2020____ @___TODAY , AFTER PRE-OP APPT ____, THIS TEST MUST BE DONE BEFORE SURGERY, COME  [address], [place] , .  [facility]) ONCE YOUR COVID TEST IS COMPLETED, PLEASE BEGIN THE QUARANTINE INSTRUCTIONS AS OUTLINED IN YOUR HANDOUT.                Allison [name]    Your procedure is scheduled on: 11-24-2018   Report to [facility] Main  Entrance   Report to admitting at 10:30AM     Call this number if you have problems the morning of surgery [phone]    Do not eat food After Midnight. YOU MAY HAVE CLEAR LIQUIDS FROM MIDNIGHT UNTIL 9:30AM. At 9:30AM Please finish the prescribed Pre-Surgery ENSURE drink. Nothing by mouth after you finish the ENSURE drink !   CLEAR LIQUID DIET   Foods Allowed                                                                     Foods Excluded  Coffee and tea, regular and decaf                             liquids that you cannot  Plain Jell-O any favor except red or purple                                           see through such as: Fruit ices (not with fruit pulp)                                     milk, soups, orange juice  Iced Popsicles                                    All solid food Carbonated beverages, regular and diet                                    Cranberry, grape and apple juices Sports drinks like Gatorade Lightly seasoned clear broth or consume(fat free) Sugar, honey syrup  Sample Menu Breakfast                                Lunch                                     Supper Cranberry juice  Beef broth                            Chicken broth Jell-O                                     Grape  juice                           Apple juice Coffee or tea                        Jell-O                                      Popsicle                                                Coffee or tea                        Coffee or tea  _____________________________________________________________________  BRUSH YOUR TEETH MORNING OF SURGERY AND RINSE YOUR MOUTH OUT, NO CHEWING GUM CANDY OR MINTS.     Take these medicines the morning of surgery with A SIP OF WATER: AMLODIPINE, CARVEDILOL, GABAPENTIN, TOPIRAMATE, IMITREX IF NEEDED, OMEPRAZOLE, PRECEOCET                                 You may not have any metal on your body including hair pins and              piercings  Do not wear jewelry, make-up, lotions, powders or perfumes, deodorant             Do not wear nail polish on your fingernails.  Do not shave  48 hours prior to surgery.                 Do not bring valuables to the hospital. [place].  Contacts, dentures or bridgework may not be worn into surgery.       Patients discharged the day of surgery will not be allowed to drive home. IF YOU ARE HAVING SURGERY AND GOING HOME THE SAME DAY, YOU MUST HAVE AN ADULT TO DRIVE YOU HOME AND BE WITH YOU FOR 24 HOURS. YOU MAY GO HOME BY TAXI OR UBER OR ORTHERWISE, BUT AN ADULT MUST ACCOMPANY YOU HOME AND STAY WITH YOU FOR 24 HOURS.  Name and phone number of your driver:  Special Instructions: N/A              Please read over the following fact sheets you were given: _____________________________________________________________________             [facility] - Preparing for Surgery Before surgery, you can play an important role.  Because skin is not sterile, your skin needs to be as free of germs as possible.  You can reduce the number of germs on your skin  by washing with CHG (chlorahexidine gluconate) soap before surgery.  CHG is an antiseptic cleaner which kills germs and bonds with  the skin to continue killing germs even after washing. Please DO NOT use if you have an allergy to CHG or antibacterial soaps.  If your skin becomes reddened/irritated stop using the CHG and inform your nurse when you arrive at Short Stay. Do not shave (including legs and underarms) for at least 48 hours prior to the first CHG shower.  You Vester shave your face/neck. Please follow these instructions carefully:  1.  Shower with CHG Soap the night before surgery and the  morning of Surgery.  2.  If you choose to wash your hair, wash your hair first as usual with your  normal  shampoo.  3.  After you shampoo, rinse your hair and body thoroughly to remove the  shampoo.                           4.  Use CHG as you would any other liquid soap.  You can apply chg directly  to the skin and wash                       Gently with a scrungie or clean washcloth.  5.  Apply the CHG Soap to your body ONLY FROM THE NECK DOWN.   Do not use on face/ open                           Wound or open sores. Avoid contact with eyes, ears mouth and genitals (private parts).                       Wash face,  Genitals (private parts) with your normal soap.             6.  Wash thoroughly, paying special attention to the area where your surgery  will be performed.  7.  Thoroughly rinse your body with warm water from the neck down.  8.  DO NOT shower/wash with your normal soap after using and rinsing off  the CHG Soap.                9.  Pat yourself dry with a clean towel.            10.  Wear clean pajamas.            11.  Place clean sheets on your bed the night of your first shower and do not  sleep with pets. Day of Surgery : Do not apply any lotions/deodorants the morning of surgery.  Please wear clean clothes to the hospital/surgery center.  FAILURE TO FOLLOW THESE INSTRUCTIONS [place] RESULT IN THE CANCELLATION OF YOUR SURGERY PATIENT SIGNATURE_________________________________  NURSE  SIGNATURE__________________________________  ________________________________________________________________________   Adam Phenix  An incentive spirometer is a tool that can help keep your lungs clear and active. This tool measures how well you are filling your lungs with each breath. Taking long deep breaths [place] help reverse or decrease the chance of developing breathing (pulmonary) problems (especially infection) following:  A long period of time when you are unable to move or be active. BEFORE THE PROCEDURE   If the spirometer includes an indicator to show your best effort, your nurse or respiratory therapist will set it to a desired goal.  If possible, sit  up straight or lean slightly forward. Try not to slouch.  Hold the incentive spirometer in an upright position. INSTRUCTIONS FOR USE  1. Sit on the edge of your bed if possible, or sit up as far as you can in bed or on a chair. 2. Hold the incentive spirometer in an upright position. 3. Breathe out normally. 4. Place the mouthpiece in your mouth and seal your lips tightly around it. 5. Breathe in slowly and as deeply as possible, raising the piston or the ball toward the top of the column. 6. Hold your breath for 3-5 seconds or for as long as possible. Allow the piston or ball to fall to the bottom of the column. 7. Remove the mouthpiece from your mouth and breathe out normally. 8. Rest for a few seconds and repeat Steps 1 through 7 at least 10 times every 1-2 hours when you are awake. Take your time and take a few normal breaths between deep breaths. 9. The spirometer [place] include an indicator to show your best effort. Use the indicator as a goal to work toward during each repetition. 10. After each set of 10 deep breaths, practice coughing to be sure your lungs are clear. If you have an incision (the cut made at the time of surgery), support your incision when coughing by placing a pillow or rolled up towels firmly  against it. Once you are able to get out of bed, walk around indoors and cough well. You Gallery stop using the incentive spirometer when instructed by your caregiver.  RISKS AND COMPLICATIONS  Take your time so you do not get dizzy or light-headed.  If you are in pain, you Rogan need to take or ask for pain medication before doing incentive spirometry. It is harder to take a deep breath if you are having pain. AFTER USE  Rest and breathe slowly and easily.  It can be helpful to keep track of a log of your progress. Your caregiver can provide you with a simple table to help with this. If you are using the spirometer at home, follow these instructions: [place] IF:   You are having difficultly using the spirometer.  You have trouble using the spirometer as often as instructed.  Your pain medication is not giving enough relief while using the spirometer.  You develop fever of 100.5 F (38.1 C) or higher. SEEK IMMEDIATE MEDICAL CARE IF:   You cough up bloody sputum that had not been present before.  You develop fever of 102 F (38.9 C) or greater.  You develop worsening pain at or near the incision site. MAKE SURE YOU:   Understand these instructions.  Will watch your condition.  Will get help right away if you are not doing well or get worse. Document Released: 05/07/2006 Document Revised: 03/19/2011 Document Reviewed: 07/08/2006 [facility] Patient Information 2014 [place], [place].   ________________________________________________________________________

## 2018-11-21 ENCOUNTER — Other Ambulatory Visit: Payer: Self-pay

## 2018-11-21 ENCOUNTER — Encounter (HOSPITAL_COMMUNITY): Payer: Self-pay

## 2018-11-21 ENCOUNTER — Other Ambulatory Visit (HOSPITAL_COMMUNITY)
Admission: RE | Admit: 2018-11-21 | Discharge: 2018-11-21 | Disposition: A | Discharge status: Home / Self Care | Payer: Medicaid Other | Source: Ambulatory Visit | Attending: Orthopedic Surgery | Admitting: Orthopedic Surgery

## 2018-11-21 ENCOUNTER — Encounter (HOSPITAL_COMMUNITY)
Admission: RE | Admit: 2018-11-21 | Discharge: 2018-11-21 | Disposition: A | Discharge status: Home / Self Care | Payer: Medicaid Other | Source: Ambulatory Visit | Attending: Orthopedic Surgery | Admitting: Orthopedic Surgery

## 2018-11-21 DIAGNOSIS — Z01812 Encounter for preprocedural laboratory examination: Secondary | ICD-10-CM | POA: Insufficient documentation

## 2018-11-21 DIAGNOSIS — Z20828 Contact with and (suspected) exposure to other viral communicable diseases: Secondary | ICD-10-CM | POA: Diagnosis not present

## 2018-11-21 LAB — CBC
HCT: 40.8 % (ref 36.0–46.0)
Hemoglobin: 12.8 g/dL (ref 12.0–15.0)
MCH: 30.2 pg (ref 26.0–34.0)
MCHC: 31.4 g/dL (ref 30.0–36.0)
MCV: 96.2 fL (ref 80.0–100.0)
Platelets: 318 10*3/uL (ref 150–400)
RBC: 4.24 MIL/uL (ref 3.87–5.11)
RDW: 13.9 % (ref 11.5–15.5)
WBC: 9.5 10*3/uL (ref 4.0–10.5)
nRBC: 0 % (ref 0.0–0.2)

## 2018-11-21 LAB — CYTOLOGY - PAP
Comment: NEGATIVE
Diagnosis: NEGATIVE
High risk HPV: NEGATIVE

## 2018-11-21 LAB — BASIC METABOLIC PANEL
Anion gap: 5 (ref 5–15)
BUN: 11 mg/dL (ref 6–20)
CO2: 21 mmol/L — ABNORMAL LOW (ref 22–32)
Calcium: 8.9 mg/dL (ref 8.9–10.3)
Chloride: 113 mmol/L — ABNORMAL HIGH (ref 98–111)
Creatinine, Ser: 0.87 mg/dL (ref 0.44–1.00)
GFR calc Af Amer: >60 mL/min (ref >60)
GFR calc non Af Amer: >60 mL/min (ref >60)
Glucose, Bld: 114 mg/dL — ABNORMAL HIGH (ref 70–99)
Potassium: 3.9 mmol/L (ref 3.5–5.1)
Sodium: 139 mmol/L (ref 135–145)

## 2018-11-22 LAB — SARS CORONAVIRUS 2 (TAT 6-24 HRS): SARS Coronavirus 2: NEGATIVE

## 2018-11-23 MED ORDER — DEXTROSE 5 % IV SOLN
3.0000 g | INTRAVENOUS | Status: AC
  Administered 2018-11-24: 3 g via INTRAVENOUS
  Filled 2018-11-23: qty 3

## 2018-11-24 ENCOUNTER — Ambulatory Visit (HOSPITAL_COMMUNITY): Payer: Medicaid Other | Admitting: Anesthesiology

## 2018-11-24 ENCOUNTER — Encounter (HOSPITAL_COMMUNITY): Payer: Self-pay

## 2018-11-24 ENCOUNTER — Encounter (HOSPITAL_COMMUNITY)
Admission: RE | Disposition: A | Discharge status: Home / Self Care | Payer: Self-pay | Source: Home / Self Care | Attending: Orthopedic Surgery

## 2018-11-24 ENCOUNTER — Ambulatory Visit (HOSPITAL_COMMUNITY)
Admission: RE | Admit: 2018-11-24 | Discharge: 2018-11-24 | Disposition: A | Discharge status: Home / Self Care | Payer: Medicaid Other | Attending: Orthopedic Surgery | Admitting: Orthopedic Surgery

## 2018-11-24 DIAGNOSIS — X58XXXA Exposure to other specified factors, initial encounter: Secondary | ICD-10-CM | POA: Insufficient documentation

## 2018-11-24 DIAGNOSIS — Z6841 Body Mass Index (BMI) 40.0 and over, adult: Secondary | ICD-10-CM | POA: Diagnosis not present

## 2018-11-24 DIAGNOSIS — M94211 Chondromalacia, right shoulder: Secondary | ICD-10-CM | POA: Insufficient documentation

## 2018-11-24 DIAGNOSIS — M1990 Unspecified osteoarthritis, unspecified site: Secondary | ICD-10-CM | POA: Diagnosis not present

## 2018-11-24 DIAGNOSIS — G43909 Migraine, unspecified, not intractable, without status migrainosus: Secondary | ICD-10-CM | POA: Diagnosis not present

## 2018-11-24 DIAGNOSIS — Z79891 Long term (current) use of opiate analgesic: Secondary | ICD-10-CM | POA: Diagnosis not present

## 2018-11-24 DIAGNOSIS — Z791 Long term (current) use of non-steroidal anti-inflammatories (NSAID): Secondary | ICD-10-CM | POA: Insufficient documentation

## 2018-11-24 DIAGNOSIS — I509 Heart failure, unspecified: Secondary | ICD-10-CM | POA: Insufficient documentation

## 2018-11-24 DIAGNOSIS — S43431A Superior glenoid labrum lesion of right shoulder, initial encounter: Secondary | ICD-10-CM | POA: Diagnosis not present

## 2018-11-24 DIAGNOSIS — I110 Hypertensive heart disease with heart failure: Secondary | ICD-10-CM | POA: Diagnosis not present

## 2018-11-24 DIAGNOSIS — K219 Gastro-esophageal reflux disease without esophagitis: Secondary | ICD-10-CM | POA: Insufficient documentation

## 2018-11-24 DIAGNOSIS — M75101 Unspecified rotator cuff tear or rupture of right shoulder, not specified as traumatic: Secondary | ICD-10-CM | POA: Insufficient documentation

## 2018-11-24 DIAGNOSIS — Z79899 Other long term (current) drug therapy: Secondary | ICD-10-CM | POA: Diagnosis not present

## 2018-11-24 DIAGNOSIS — M25511 Pain in right shoulder: Secondary | ICD-10-CM | POA: Diagnosis present

## 2018-11-24 HISTORY — PX: SHOULDER ARTHROSCOPY WITH ROTATOR CUFF REPAIR: SHX5685

## 2018-11-24 SURGERY — ARTHROSCOPY, SHOULDER, WITH ROTATOR CUFF REPAIR
Anesthesia: General | Site: Shoulder | Laterality: Right

## 2018-11-24 MED ORDER — FENTANYL CITRATE (PF) 100 MCG/2ML IJ SOLN
50.0000 ug | Freq: Once | INTRAMUSCULAR | Status: AC
  Administered 2018-11-24: 12:00:00 50 ug via INTRAVENOUS
  Filled 2018-11-24: qty 2

## 2018-11-24 MED ORDER — FENTANYL CITRATE (PF) 100 MCG/2ML IJ SOLN: 25.0000 ug | INTRAMUSCULAR | Status: DC | PRN

## 2018-11-24 MED ORDER — SUGAMMADEX SODIUM 200 MG/2ML IV SOLN
INTRAVENOUS | Status: DC | PRN
  Administered 2018-11-24: 350 mg via INTRAVENOUS

## 2018-11-24 MED ORDER — RIVAROXABAN 10 MG PO TABS: 10.0000 mg | ORAL_TABLET | Freq: Every day | ORAL | 0 refills | Status: DC

## 2018-11-24 MED ORDER — SODIUM CHLORIDE 0.9 % IR SOLN
Status: DC | PRN
  Administered 2018-11-24 (×2): 3000 mL

## 2018-11-24 MED ORDER — PROPOFOL 10 MG/ML IV BOLUS
INTRAVENOUS | Status: AC
  Filled 2018-11-24: qty 20

## 2018-11-24 MED ORDER — ONDANSETRON HCL 4 MG/2ML IJ SOLN
INTRAMUSCULAR | Status: AC
  Filled 2018-11-24: qty 2

## 2018-11-24 MED ORDER — SODIUM CHLORIDE 0.9 % IR SOLN
Status: DC | PRN
  Administered 2018-11-24: 1000 mL

## 2018-11-24 MED ORDER — BUPIVACAINE LIPOSOME 1.3 % IJ SUSP
INTRAMUSCULAR | Status: DC | PRN
  Administered 2018-11-24: 10 mL via PERINEURAL

## 2018-11-24 MED ORDER — MIDAZOLAM HCL 2 MG/2ML IJ SOLN
1.0000 mg | Freq: Once | INTRAMUSCULAR | Status: AC
  Administered 2018-11-24: 2 mg via INTRAVENOUS
  Filled 2018-11-24: qty 2

## 2018-11-24 MED ORDER — FENTANYL CITRATE (PF) 100 MCG/2ML IJ SOLN
INTRAMUSCULAR | Status: DC | PRN
  Administered 2018-11-24 (×2): 50 ug via INTRAVENOUS

## 2018-11-24 MED ORDER — BUPIVACAINE-EPINEPHRINE (PF) 0.5% -1:200000 IJ SOLN
INTRAMUSCULAR | Status: DC | PRN
  Administered 2018-11-24: 15 mL via PERINEURAL

## 2018-11-24 MED ORDER — ROCURONIUM BROMIDE 10 MG/ML (PF) SYRINGE
PREFILLED_SYRINGE | INTRAVENOUS | Status: AC
  Filled 2018-11-24: qty 10

## 2018-11-24 MED ORDER — LACTATED RINGERS IV SOLN
INTRAVENOUS | Status: DC
  Administered 2018-11-24 (×2): via INTRAVENOUS

## 2018-11-24 MED ORDER — FENTANYL CITRATE (PF) 100 MCG/2ML IJ SOLN
INTRAMUSCULAR | Status: AC
  Filled 2018-11-24: qty 2

## 2018-11-24 MED ORDER — LIDOCAINE 2% (20 MG/ML) 5 ML SYRINGE
INTRAMUSCULAR | Status: AC
  Filled 2018-11-24: qty 5

## 2018-11-24 MED ORDER — CHLORHEXIDINE GLUCONATE 4 % EX LIQD: 60.0000 mL | Freq: Once | CUTANEOUS | Status: DC

## 2018-11-24 MED ORDER — DEXAMETHASONE SODIUM PHOSPHATE 10 MG/ML IJ SOLN
INTRAMUSCULAR | Status: DC | PRN
  Administered 2018-11-24: 10 mg via INTRAVENOUS

## 2018-11-24 MED ORDER — ONDANSETRON HCL 4 MG/2ML IJ SOLN
INTRAMUSCULAR | Status: DC | PRN
  Administered 2018-11-24: 4 mg via INTRAVENOUS

## 2018-11-24 MED ORDER — SUCCINYLCHOLINE CHLORIDE 200 MG/10ML IV SOSY
PREFILLED_SYRINGE | INTRAVENOUS | Status: AC
  Filled 2018-11-24: qty 10

## 2018-11-24 MED ORDER — SUGAMMADEX SODIUM 500 MG/5ML IV SOLN
INTRAVENOUS | Status: AC
  Filled 2018-11-24: qty 5

## 2018-11-24 MED ORDER — DEXAMETHASONE SODIUM PHOSPHATE 10 MG/ML IJ SOLN
INTRAMUSCULAR | Status: AC
  Filled 2018-11-24: qty 1

## 2018-11-24 MED ORDER — BUPIVACAINE-EPINEPHRINE 0.25% -1:200000 IJ SOLN
INTRAMUSCULAR | Status: DC | PRN
  Administered 2018-11-24: 7 mL

## 2018-11-24 MED ORDER — PROPOFOL 10 MG/ML IV BOLUS
INTRAVENOUS | Status: DC | PRN
  Administered 2018-11-24: 200 mg via INTRAVENOUS
  Administered 2018-11-24: 150 mg via INTRAVENOUS

## 2018-11-24 MED ORDER — ROCURONIUM BROMIDE 10 MG/ML (PF) SYRINGE
PREFILLED_SYRINGE | INTRAVENOUS | Status: DC | PRN
  Administered 2018-11-24: 40 mg via INTRAVENOUS
  Administered 2018-11-24: 20 mg via INTRAVENOUS

## 2018-11-24 MED ORDER — OXYCODONE HCL 5 MG/5ML PO SOLN: 5.0000 mg | Freq: Once | ORAL | Status: DC | PRN

## 2018-11-24 MED ORDER — ONDANSETRON HCL 4 MG/2ML IJ SOLN: 4.0000 mg | Freq: Once | INTRAMUSCULAR | Status: DC | PRN

## 2018-11-24 MED ORDER — BUPIVACAINE-EPINEPHRINE 0.25% -1:200000 IJ SOLN
INTRAMUSCULAR | Status: AC
  Filled 2018-11-24: qty 1

## 2018-11-24 MED ORDER — SUCCINYLCHOLINE CHLORIDE 200 MG/10ML IV SOSY
PREFILLED_SYRINGE | INTRAVENOUS | Status: DC | PRN
  Administered 2018-11-24: 120 mg via INTRAVENOUS

## 2018-11-24 MED ORDER — OXYCODONE HCL 5 MG PO TABS: 5.0000 mg | ORAL_TABLET | Freq: Once | ORAL | Status: DC | PRN

## 2018-11-24 MED ORDER — LIDOCAINE 2% (20 MG/ML) 5 ML SYRINGE
INTRAMUSCULAR | Status: DC | PRN
  Administered 2018-11-24: 20 mg via INTRAVENOUS

## 2018-11-24 SURGICAL SUPPLY — 86 items
AID PSTN UNV HD RSTRNT DISP (MISCELLANEOUS)
ANCH SUT 1.3 2 RBN BLU WHT (Anchor) ×2 IMPLANT
ANCH SUT 2 1.3X1 LD 1 STRN (Anchor) ×1 IMPLANT
ANCHOR ALL SUT RC W2 1.3 RIB (Anchor) ×4 IMPLANT
ANCHOR ALL-SUT FLEX 1.3 Y-KNOT (Anchor) ×2 IMPLANT
ANCHOR ALL-SUT RC W2 1.3 RIB (Anchor) IMPLANT
BIT DRILL 1.3M DISPOSABLE (BIT) ×2 IMPLANT
BLADE SURG SZ11 CARB STEEL (BLADE) ×3 IMPLANT
BUR OVAL 4.0 (BURR) IMPLANT
BURR OVAL 8 FLU 4.0MM X 13CM (MISCELLANEOUS) ×1
BURR OVAL 8 FLU 4.0X13 (MISCELLANEOUS) ×1 IMPLANT
CLOSURE STERI-STRIP 1/2X4 (GAUZE/BANDAGES/DRESSINGS) ×1
CLOSURE WOUND 1/2 X4 (GAUZE/BANDAGES/DRESSINGS) ×1
CLSR STERI-STRIP ANTIMIC 1/2X4 (GAUZE/BANDAGES/DRESSINGS) ×1 IMPLANT
COVER SURGICAL LIGHT HANDLE (MISCELLANEOUS) ×3 IMPLANT
COVER WAND RF STERILE (DRAPES) ×3 IMPLANT
CUTTER BONE 4.0MM X 13CM (MISCELLANEOUS) ×2 IMPLANT
DRAPE INCISE IOBAN 66X45 STRL (DRAPES) ×3 IMPLANT
DRAPE ORTHO SPLIT 77X108 STRL (DRAPES) ×6
DRAPE STERI 35X30 U-POUCH (DRAPES) ×3 IMPLANT
DRAPE SURG ORHT 6 SPLT 77X108 (DRAPES) ×2 IMPLANT
DRAPE U-SHAPE 47X51 STRL (DRAPES) ×3 IMPLANT
DRILL BIT 5/64 (BIT) ×3 IMPLANT
DRSG EMULSION OIL 3X3 NADH (GAUZE/BANDAGES/DRESSINGS) ×4 IMPLANT
DRSG PAD ABDOMINAL 8X10 ST (GAUZE/BANDAGES/DRESSINGS) ×4 IMPLANT
DURAPREP 26ML APPLICATOR (WOUND CARE) ×3 IMPLANT
ELECT NDL TIP 2.8 STRL (NEEDLE) ×1 IMPLANT
ELECT NEEDLE TIP 2.8 STRL (NEEDLE) ×3 IMPLANT
ELECT REM PT RETURN 15FT ADLT (MISCELLANEOUS) ×2 IMPLANT
GAUZE SPONGE 4X4 12PLY STRL (GAUZE/BANDAGES/DRESSINGS) ×3 IMPLANT
GLOVE BIOGEL PI ORTHO PRO 7.5 (GLOVE) ×2
GLOVE BIOGEL PI ORTHO PRO SZ8 (GLOVE) ×2
GLOVE ORTHO TXT STRL SZ7.5 (GLOVE) ×3 IMPLANT
GLOVE PI ORTHO PRO STRL 7.5 (GLOVE) ×1 IMPLANT
GLOVE PI ORTHO PRO STRL SZ8 (GLOVE) ×1 IMPLANT
GLOVE SURG ORTHO 8.5 STRL (GLOVE) ×3 IMPLANT
GOWN STRL REUS W/ TWL LRG LVL3 (GOWN DISPOSABLE) ×2 IMPLANT
GOWN STRL REUS W/ TWL XL LVL3 (GOWN DISPOSABLE) ×2 IMPLANT
GOWN STRL REUS W/TWL LRG LVL3 (GOWN DISPOSABLE) ×6
GOWN STRL REUS W/TWL XL LVL3 (GOWN DISPOSABLE) ×6
KIT BASIN OR (CUSTOM PROCEDURE TRAY) ×3 IMPLANT
KIT JUGGERKNOT DISP 2.9MM (KITS) IMPLANT
KIT TURNOVER KIT A (KITS) IMPLANT
MANIFOLD NEPTUNE II (INSTRUMENTS) ×3 IMPLANT
NDL 1/2 CIR CATGUT .05X1.09 (NEEDLE) ×1 IMPLANT
NDL HYPO 25X1 1.5 SAFETY (NEEDLE) ×1 IMPLANT
NDL MAYO 6 CRC TAPER PT (NEEDLE) IMPLANT
NDL SPNL 18GX3.5 QUINCKE PK (NEEDLE) ×1 IMPLANT
NDL SUT 6 .5 CRC .975X.05 MAYO (NEEDLE) IMPLANT
NEEDLE 1/2 CIR CATGUT .05X1.09 (NEEDLE) ×3 IMPLANT
NEEDLE HYPO 25X1 1.5 SAFETY (NEEDLE) ×3 IMPLANT
NEEDLE MAYO 6 CRC TAPER PT (NEEDLE) ×9 IMPLANT
NEEDLE MAYO TAPER (NEEDLE)
NEEDLE SPNL 18GX3.5 QUINCKE PK (NEEDLE) ×3 IMPLANT
NS IRRIG 1000ML POUR BTL (IV SOLUTION) ×3 IMPLANT
PACK SHOULDER (CUSTOM PROCEDURE TRAY) ×3 IMPLANT
PAD ARMBOARD 7.5X6 YLW CONV (MISCELLANEOUS) ×6 IMPLANT
PORT APPOLLO RF 90DEGREE MULTI (SURGICAL WAND) ×2 IMPLANT
RESECTOR FULL RADIUS 4.2MM (BLADE) IMPLANT
RESTRAINT HEAD UNIVERSAL NS (MISCELLANEOUS) IMPLANT
SET JUGGERKNOT DISP 1.4MM (SET/KITS/TRAYS/PACK) IMPLANT
SLING ARM FOAM STRAP LRG (SOFTGOODS) ×1 IMPLANT
SLING ARM FOAM STRAP MED (SOFTGOODS) IMPLANT
SLING ARM FOAM STRAP XLG (SOFTGOODS) ×4 IMPLANT
SPONGE LAP 4X18 RFD (DISPOSABLE) ×3 IMPLANT
STRIP CLOSURE SKIN 1/2X4 (GAUZE/BANDAGES/DRESSINGS) ×2 IMPLANT
SUCTION FRAZIER HANDLE 10FR (MISCELLANEOUS) ×2
SUCTION TUBE FRAZIER 10FR DISP (MISCELLANEOUS) ×1 IMPLANT
SUT BONE WAX W31G (SUTURE) IMPLANT
SUT FIBERWIRE #2 38 T-5 BLUE (SUTURE)
SUT HI-FI 2 STRAND C-2 40 (SUTURE) ×4 IMPLANT
SUT MNCRL AB 4-0 PS2 18 (SUTURE) ×3 IMPLANT
SUT VIC AB 0 CT1 27 (SUTURE) ×3
SUT VIC AB 0 CT1 27XBRD ANBCTR (SUTURE) IMPLANT
SUT VIC AB 0 CT2 27 (SUTURE) IMPLANT
SUT VIC AB 2-0 CT1 27 (SUTURE) ×3
SUT VIC AB 2-0 CT1 TAPERPNT 27 (SUTURE) IMPLANT
SUTURE FIBERWR #2 38 T-5 BLUE (SUTURE) IMPLANT
SYR CONTROL 10ML LL (SYRINGE) ×3 IMPLANT
TAPE CLOTH SURG 6X10 WHT LF (GAUZE/BANDAGES/DRESSINGS) ×2 IMPLANT
TOWEL OR 17X26 10 PK STRL BLUE (TOWEL DISPOSABLE) ×6 IMPLANT
TUBING ARTHROSCOPY IRRIG 16FT (MISCELLANEOUS) ×3 IMPLANT
TUBING CONNECTING 10 (TUBING) ×2 IMPLANT
TUBING CONNECTING 10' (TUBING) ×1
WAND HAND CNTRL MULTIVAC 90 (MISCELLANEOUS) ×3 IMPLANT
WATER STERILE IRR 1000ML POUR (IV SOLUTION) ×3 IMPLANT

## 2018-11-24 NOTE — Op Note (Signed)
NAME: Allison [name], Allison [name] MEDICAL RECORD [id number] ACCOUNT [id number] DATE OF BIRTH:[birth year] FACILITY: [facility] LOCATION: [facility] PHYSICIAN:STEVEN Orlena Sheldon, MD  OPERATIVE REPORT  DATE OF PROCEDURE:  11/24/2018  PREOPERATIVE DIAGNOSIS:  Right shoulder rotator cuff tear and superior labrum anterior posterior tear.  POSTOPERATIVE DIAGNOSIS:  Right shoulder rotator cuff tear and superior labrum anterior to posterior tear.  PROCEDURE PERFORMED:  Right shoulder arthroscopy with extensive intra-articular debridement of torn superior labrum anterior to posterior with arthroscopic biceps tenotomy, followed by arthroscopic subacromial decompression with mini open rotator cuff  repair and biceps tenodesis in the groove.  ATTENDING SURGEON:  Esmond Plants, MD  ASSISTANT:  Darol Destine, [place], who was scrubbed during the entire procedure and necessary for satisfactory completion of surgery.  ANESTHESIA:  General anesthesia was used, plus interscalene block.  ESTIMATED BLOOD LOSS:  Minimal.  FLUID REPLACEMENT:  1500 mL crystalloid.  INSTRUMENT COUNTS:  Correct.  COMPLICATIONS:  There were no complications.  ANTIBIOTICS:  Perioperative antibiotics were given.  INDICATIONS:  The patient is a 38 year old female with worsening right shoulder pain secondary to a rotator cuff tear, as well as a symptomatic SLAP tear.  The patient presents for operative treatment, having failed conservative management and desiring  pain relief and improving the function.  Risks and benefits of surgical management discussed in detail with the patient.  Informed consent obtained.  DESCRIPTION OF PROCEDURE:  After an adequate level of anesthesia was achieved, the patient was positioned in modified beach chair position.  Right shoulder correctly identified and sterilely prepped and draped in the usual manner.  Timeout was called,  verifying correct patient, correct site.  We entered the shoulder using standard  arthroscopic portals, including anterior, posterior and lateral portals.  We identified significant tearing of the superior labrum involving the entire biceps anchor.   Biceps was unstable.  We performed a biceps tenotomy and a labral debridement back to a stable labral rim.  The majority of the anterior inferior and posterior inferior labrum was intact.  The articular cartilage was in good condition with minimal  chondromalacia.  Subscapularis was intact.  The supraspinatus was torn.  This appeared to extend back into the infraspinatus.  Teres minor was normal.  We placed the scope in the subacromial space.  A thorough bursectomy was performed, followed by  release of the CA ligament.  An acromioplasty was also performed with the butcher block technique utilizing high speed bur.  We had nice decompression of the rotator cuff outlet.  We did not violate the inferior AC joint capsule.  Once we had the  decompression complete, we did inspect the cuff from the bursal surface.  There was an area of abrasion, but no full thickness tear noted.  We concluded the arthroscopic portion of procedure.  We then went ahead and made a mini open incision starting at  the anterolateral border of the acromion extending distally about 4 or 5 cm.  For this patient, his body mass index was 63.  We were able to dissect down through subcutaneous tissues with Bovie.  We identified the deltoid raphae between the anterior and  lateral heads of the deltoid.  We divided that using needle tip Bovie, placed Arthrex retractor, identified the biceps groove, delivered the biceps tendon out of the wound, whipstitched with #2 Hi-Fi suture to reinforce the biceps tendon in the tenodesis  area.  We then prepared the floor of the biceps groove with a Soil scientist, scraping down to bleeding bone.  We placed  a Y-Knot Flex anchor through the floor of the biceps groove and brought that suture up in a mattress fashion through the reinforced   portion of the tendon with the elbow at 90 degrees mid tension.  We then took the longitudinal whipstitch sutures up through the rotator interval and tied over a soft tissue bridge, incorporating part of the subscap.  Thus, we had a double fixed  tenodesis with some compression with the suture anchor in longitudinal pole with the suture that was taken through the rotator interval.  Next, we palpated the rotator cuff just posterior to the biceps groove and identified a soft area.  We lifted up the  free edge of the tendon, immediately encountered torn undersurface tear which was pretty much the entire width of the tendon for perhaps 1.5 cm in front to back length.  There was no tendon retraction.  We went ahead and used #2 Hi-Fi suture x3 into the  free edge of the tendon, used a Cobb elevator on both sides of the tendon to free it up and then we freshened the greater tuberosity with a rongeur, getting to bleeding bone and used a curette as well.  We placed 2 Y-Knot RC anchors loaded with ribbon  at the articular cartilage-greater tuberosity interface, 1 more posteriorly and 1 right up just posterior to the biceps groove.  We brought those sutures up through the medial portion of the rotator cuff footprint, double mattress for each one  and then  took the 3 sutures down through drill holes and tied over a lateral bone bridge.  We had an anatomic watertight repair, not under tension with the arm at the side.  We irrigated thoroughly and then repaired the deltoid anatomically with 0 Vicryl suture,  followed by 2-0 Vicryl for subcutaneous closure and 4-0 Monocryl for skin and portals.  Steri-Strips applied, followed by sterile dressing.  The patient tolerated surgery well.  VN/NUANCE  D:11/24/2018 T:11/24/2018 JOB:[id number]

## 2018-11-24 NOTE — Brief Op Note (Signed)
11/24/2018  3:35 PM  PATIENT:  Delia Heady  38 y.o. female  PRE-OPERATIVE DIAGNOSIS:  Right shoulder cuff tear, SLAP tear  POST-OPERATIVE DIAGNOSIS:  Right shoulder cuff tear, SLAP tear  PROCEDURE:  Procedure(s) with comments: SHOULDER ARTHROSCOPY WITH ROTATOR CUFF REPAIR subacromial decompression open biceps tenodesis (Right) - interscalene block  SURGEON:  Surgeon(s) and Role:    Netta Cedars, MD - Primary  PHYSICIAN ASSISTANT:   ASSISTANTS: [place] Bruns, PA-C   ANESTHESIA:   regional and general  EBL:  100 mL   BLOOD ADMINISTERED:none  DRAINS: none   LOCAL MEDICATIONS USED:  MARCAINE     SPECIMEN:  No Specimen  DISPOSITION OF SPECIMEN:  N/A  COUNTS:  YES  TOURNIQUET:  * No tourniquets in log *  DICTATION: .Other Dictation: Dictation Number [id number]  PLAN OF CARE: Discharge to home after PACU  PATIENT DISPOSITION:  PACU - hemodynamically stable.   Delay start of Pharmacological VTE agent (>24hrs) due to surgical blood loss or risk of bleeding: not applicable

## 2018-11-24 NOTE — Anesthesia Postprocedure Evaluation (Signed)
Anesthesia Post Note  Patient: CARINA CHAPLIN  Procedure(s) Performed: SHOULDER ARTHROSCOPY WITH ROTATOR CUFF REPAIR subacromial decompression open biceps tenodesis (Right Shoulder)     Patient location during evaluation: PACU Anesthesia Type: General Level of consciousness: awake and alert Pain management: pain level controlled Vital Signs Assessment: post-procedure vital signs reviewed and stable Respiratory status: spontaneous breathing, nonlabored ventilation and respiratory function stable Cardiovascular status: blood pressure returned to baseline and stable Postop Assessment: no apparent nausea or vomiting Anesthetic complications: no    Last Vitals:  Vitals:   11/24/18 1615 11/24/18 1627  BP: 124/81 (!) 146/86  Pulse: 89 92  Resp: 20 16  Temp: 36.5 C 36.4 C  SpO2: 96% 96%    Last Pain:  Vitals:   11/24/18 1627  TempSrc:   PainSc: 0-No pain                 Lidia Collum

## 2018-11-24 NOTE — Anesthesia Procedure Notes (Signed)
Procedure Name: Intubation Date/Time: 11/24/2018 1:35 PM Performed by: Anne Fu, CRNA Pre-anesthesia Checklist: Patient identified, Emergency Drugs available, Suction available, Patient being monitored and Timeout performed Patient Re-evaluated:Patient Re-evaluated prior to induction Oxygen Delivery Method: Circle system utilized Preoxygenation: Pre-oxygenation with 100% oxygen Induction Type: IV induction Ventilation: Mask ventilation without difficulty Laryngoscope Size: Mac and 4 Grade View: Grade I Tube type: Oral Tube size: 7.5 mm Number of attempts: 1 Airway Equipment and Method: Video-laryngoscopy and Rigid stylet Placement Confirmation: ETT inserted through vocal cords under direct vision,  positive ETCO2 and breath sounds checked- equal and bilateral Secured at: 22 cm Tube secured with: Tape Dental Injury: Teeth and Oropharynx as per pre-operative assessment  Difficulty Due To: Difficulty was anticipated Future Recommendations: Recommend- induction with short-acting agent, and alternative techniques readily available

## 2018-11-24 NOTE — Anesthesia Procedure Notes (Signed)
Anesthesia Regional Block: Interscalene brachial plexus block   Pre-Anesthetic Checklist: ,, timeout performed, Correct Patient, Correct Site, Correct Laterality, Correct Procedure, Correct Position, site marked, Risks and benefits discussed,  Surgical consent,  Pre-op evaluation,  At surgeon's request and post-op pain management  Laterality: Right  Prep: chloraprep       Needles:  Injection technique: Single-shot  Needle Type: Echogenic Needle     Needle Length: 9cm  Needle Gauge: 21     Additional Needles:   Procedures:, nerve stimulator,,, ultrasound used (permanent image in chart),,,,   Nerve Stimulator or Paresthesia:  Response: deltoid, 0.5 mA,   Additional Responses:   Narrative:  Start time: 11/24/2018 12:08 PM End time: 11/24/2018 12:15 PM Injection made incrementally with aspirations every 5 mL.  Performed by: Personally  Anesthesiologist: Suzette Battiest, MD

## 2018-11-24 NOTE — Discharge Instructions (Signed)
Ice to the shoulder constantly!!  Keep the incision covered and clean and dry for one week, then ok to get it wet in the shower.  Patient has pain medication per her pain MD, call pain doctor for any pain medicine issues  Xarelto has been called in to your [facility]. Start taking 10 mg daily tomorrow to prevent blood clots.   Do exercise as instructed every hour while awake.  Do the following for 5 minute sessions: * Pendulum exercises - dangle the arm in circles as you lean over to the right side.. let the arm swing freely * Rotation exercises - while hugging a small pillow rotate your forearm in to give yourself a hug and then rotate out like you are hitch hiking * Lap slides - with your hand resting on your thigh, slide your hand out toward your knee and then back to your hip - go back and forth for a few minutes, this is a gliding motion.   DO NOT reach behind your back or push up out of a chair with the operative arm.  Use a sling while you are up and around for comfort, may remove while seated.  Keep pillow propped behind the operative elbow.  Follow up with Allison [name] in two weeks in the office, call [phone] for appt

## 2018-11-24 NOTE — Transfer of Care (Signed)
Immediate Anesthesia Transfer of Care Note  Patient: Allison [name]  Procedure(s) Performed: SHOULDER ARTHROSCOPY WITH ROTATOR CUFF REPAIR subacromial decompression open biceps tenodesis (Right Shoulder)  Patient Location: PACU  Anesthesia Type:General  Level of Consciousness: awake, alert , oriented and patient cooperative  Airway & Oxygen Therapy: Patient Spontanous Breathing and Patient connected to face mask oxygen  Post-op Assessment: Report given to RN, Post -op Vital signs reviewed and stable and Patient moving all extremities  Post vital signs: Reviewed and stable  Last Vitals:  Vitals Value Taken Time  BP 128/68 11/24/18 1551  Temp 36.5 C 11/24/18 1551  Pulse 88 11/24/18 1555  Resp 22 11/24/18 1555  SpO2 100 % 11/24/18 1555  Vitals shown include unvalidated device data.  Last Pain:  Vitals:   11/24/18 1551  TempSrc:   PainSc: 0-No pain         Complications: No apparent anesthesia complications

## 2018-11-24 NOTE — Progress Notes (Signed)
Assisted Dr. Rob Fitzgerald with right, ultrasound guided, interscalene  block. Side rails up, monitors on throughout procedure. See vital signs in flow sheet. Tolerated Procedure well. 

## 2018-11-24 NOTE — Anesthesia Preprocedure Evaluation (Signed)
Anesthesia Evaluation  Patient identified by MRN, date of birth, ID band Patient awake    Reviewed: Allergy & Precautions, NPO status , Patient's Chart, lab work & pertinent test results, reviewed documented beta blocker date and time   Airway Mallampati: II  TM Distance: >3 FB Neck ROM: Full    Dental  (+) Dental Advisory Given   Pulmonary neg pulmonary ROS,    breath sounds clear to auscultation       Cardiovascular hypertension, Pt. on medications and Pt. on home beta blockers +CHF   Rhythm:Regular Rate:Normal     Neuro/Psych  Headaches,    GI/Hepatic Neg liver ROS, GERD  Medicated,  Endo/Other  Morbid obesity  Renal/GU negative Renal ROS     Musculoskeletal  (+) Arthritis ,   Abdominal   Peds  Hematology negative hematology ROS (+)   Anesthesia Other Findings   Reproductive/Obstetrics                             Lab Results  Component Value Date   WBC 9.5 11/21/2018   HGB 12.8 11/21/2018   HCT 40.8 11/21/2018   MCV 96.2 11/21/2018   PLT 318 11/21/2018   Lab Results  Component Value Date   CREATININE 0.87 11/21/2018   BUN 11 11/21/2018   NA 139 11/21/2018   K 3.9 11/21/2018   CL 113 (H) 11/21/2018   CO2 21 (L) 11/21/2018    Anesthesia Physical Anesthesia Plan  ASA: III  Anesthesia Plan: General   Post-op Pain Management:  Regional for Post-op pain   Induction: Intravenous  PONV Risk Score and Plan: 3 and Dexamethasone, Ondansetron and Treatment may vary due to age or medical condition  Airway Management Planned: Oral ETT  Additional Equipment:   Intra-op Plan:   Post-operative Plan: Extubation in OR  Informed Consent: I have reviewed the patients History and Physical, chart, labs and discussed the procedure including the risks, benefits and alternatives for the proposed anesthesia with the patient or authorized representative who has indicated his/her  understanding and acceptance.     Dental advisory given  Plan Discussed with: CRNA  Anesthesia Plan Comments:         Anesthesia Quick Evaluation

## 2018-11-25 ENCOUNTER — Encounter (HOSPITAL_COMMUNITY): Payer: Self-pay | Admitting: Orthopedic Surgery

## 2018-12-24 ENCOUNTER — Other Ambulatory Visit: Payer: Self-pay | Admitting: Cardiology

## 2018-12-24 DIAGNOSIS — I420 Dilated cardiomyopathy: Secondary | ICD-10-CM

## 2019-11-14 ENCOUNTER — Other Ambulatory Visit: Payer: Self-pay | Admitting: Cardiology

## 2019-11-14 DIAGNOSIS — I420 Dilated cardiomyopathy: Secondary | ICD-10-CM

## 2019-12-09 HISTORY — PX: LAPAROSCOPIC GASTRIC SLEEVE RESECTION: SHX5895

## 2020-02-03 NOTE — Progress Notes (Signed)
Cardiology Office Note   Date:  02/04/2020   ID:  Allison [name], DOB Sep 06, 1980, MRN [id number]  PCP:  Jackie Plum, MD  Cardiologist:   Rollene Rotunda, MD   Chief Complaint  Patient presents with  . Atrial Fibrillation      History of Present Illness: Allison [name] is a 40 y.o. female who presents for followup of nonischemic cardiomyopathy with an ejection fraction as low as 25% followup echo in 2012 and echo in Dec 2014 demonstrated it was up to 60%.  Since I last saw her she had bariatric surgery.  She lost 34 pounds so far.  Cannot sleep.  She was hoping to have some improvement in her back pain but this has not happened yet.  Her back pain is probably a little worse because she had to come off of some of her medicines but she is not allowed to take any more with her surgery.  However, otherwise she feels okay.  She is not having any new shortness of breath, PND or orthopnea.  She is not having any new palpitations, presyncope or syncope.  She has no chest pressure, neck or arm discomfort.  Past Medical History:  Diagnosis Date  . Arthritis   . 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 Carpal tunnel syndrome of right wrist   . Chronic back pain   . Gallstones   . GERD (gastroesophageal reflux disease)    takes Omeprazole daily  . History of migraine   . Migraines   . Morbid obesity (HCC)   . Nausea    takes Phenergan as needed  . Osteoarthritis    takes Relafen daily  . PIH (pregnancy induced hypertension)    takes Coreg,Enalapril,and Amlodipine daily    Past Surgical History:  Procedure Laterality Date  . CARPAL TUNNEL RELEASE Right 08/02/2016   Procedure: CARPAL TUNNEL RELEASE RIGHT;  Surgeon: Coletta Memos, MD;  Location: [facility] OR;  Service: Neurosurgery;  Laterality: Right;  right  . CESAREAN SECTION  2011  . CHOLECYSTECTOMY N/A 01/23/2013   Procedure: LAPAROSCOPIC CHOLECYSTECTOMY;  Surgeon: Atilano Ina, MD;  Location: [facility] OR;  Service: General;   Laterality: N/A;  . SHOULDER ARTHROSCOPY WITH ROTATOR CUFF REPAIR Right 11/24/2018   Procedure: SHOULDER ARTHROSCOPY WITH ROTATOR CUFF REPAIR subacromial decompression open biceps tenodesis;  Surgeon: Beverely Low, MD;  Location: [facility];  Service: Orthopedics;  Laterality: Right;  interscalene block  . TUBAL LIGATION  2011     Current Outpatient Medications  Medication Sig Dispense Refill  . acetaminophen (TYLENOL) 500 MG tablet Take 1,000 mg by mouth every 6 (six) hours as needed for moderate pain.    Marland Kitchen amitriptyline (ELAVIL) 25 MG tablet Take 25 mg by mouth at bedtime.    Marland Kitchen amLODipine (NORVASC) 2.5 MG tablet Take 1 tablet (2.5 mg total) by mouth daily. 90 tablet 3  . carvedilol (COREG) 25 MG tablet TAKE 1 TABLET BY MOUTH TWICE A DAY WITH A MEAL 180 tablet 3  . cholecalciferol (VITAMIN D3) 25 MCG (1000 UT) tablet Take 1,000 Units by mouth daily.    . cyclobenzaprine (FLEXERIL) 10 MG tablet Take 10 mg by mouth at bedtime.    . DENTA 5000 PLUS 1.1 % CREA dental cream Place 1 application onto teeth daily.   6  . ferrous sulfate 325 (65 FE) MG EC tablet Take 325 mg by mouth daily.   2  . omeprazole (PRILOSEC) 20 MG capsule Take 20 mg by mouth daily before breakfast.     .  oxyCODONE-acetaminophen (PERCOCET) 10-325 MG per tablet Take 1 tablet by mouth 4 (four) times daily.   0  . pregabalin (LYRICA) 150 MG capsule Take 150 mg by mouth 2 (two) times daily.    . SUMAtriptan (IMITREX) 50 MG tablet Take 50 mg by mouth daily as needed for migraine. May repeat in 2 hours if headache persists or recurs.    Marland Kitchen tinidazole (TINDAMAX) 500 MG tablet Take 2 tablets (1,000 mg total) by mouth daily with breakfast. 10 tablet 2  . topiramate (TOPAMAX) 200 MG tablet Take 200 mg by mouth 2 (two) times daily.    . vitamin B-12 (CYANOCOBALAMIN) 1000 MCG tablet Take 1,000 mcg by mouth daily.     No current facility-administered medications for this visit.    Allergies:   Cymbalta [duloxetine hcl] and Reglan  [metoclopramide]     ROS:  Please see the history of present illness.   Otherwise, review of systems are positive for none.   All other systems are reviewed and negative.    PHYSICAL EXAM: VS:  BP 118/84 (BP Location: Right Arm, Patient Position: Sitting, Cuff Size: Large)   Pulse 94   Ht 5\' 4"  (1.626 m)   Wt 282 lb (127.9 kg)   BMI 48.41 kg/m  , BMI Body mass index is 48.41 kg/m. GENERAL:  Well appearing NECK:  No jugular venous distention, waveform within normal limits, carotid upstroke brisk and symmetric, no bruits, no thyromegaly LUNGS:  Clear to auscultation bilaterally CHEST:  Unremarkable HEART:  PMI not displaced or sustained,S1 and S2 within normal limits, no S3, no S4, no clicks, no rubs, no murmurs ABD:  Flat, positive bowel sounds normal in frequency in pitch, no bruits, no rebound, no guarding, no midline pulsatile mass, no hepatomegaly, no splenomegaly EXT:  2 plus pulses throughout, no edema, no cyanosis no clubbing   EKG:  EKG is ordered today. The ekg ordered today demonstrates NSR, rate 94, axis within normal limits, intervals within normal limits, no acute ST-T wave changes.   Recent Labs: No results found for requested labs within last 8760 hours.    Lipid Panel No results found for: CHOL, TRIG, HDL, CHOLHDL, VLDL, LDLCALC, LDLDIRECT    Wt Readings from Last 3 Encounters:  02/04/20 282 lb (127.9 kg)  11/21/18 (!) 377 lb 8 oz (171.2 kg)  11/18/18 (!) 318 lb 6.4 oz (144.4 kg)      Other studies Reviewed: Additional studies/ records that were reviewed today include: None. Review of the above records demonstrates:     ASSESSMENT AND PLAN:  CARDIOMYOPATHY:    Her EF was improved in 2018.  I would not suspect clinically that this was changed.  No change in therapy.  ESSENTIAL HYPERTENSION:  Her BP is actually starting to run low with her weight loss.  I will back off on her amlodipine to 2.5 mg daily and we might be able to stop this in the future.     OBESITY, MORBID     I am very proud of her for taking steps for weight loss.    Current medicines are reviewed at length with the patient today.  The patient does not have concerns regarding medicines.  The following changes have been made:  As above  Labs/ tests ordered today include:   Orders Placed This Encounter  Procedures  . EKG 12-Lead     Disposition:   FU with me in six months.     Signed, 2019, MD  02/04/2020 5:13 PM  [facility]

## 2020-02-04 ENCOUNTER — Encounter: Payer: Self-pay | Admitting: Cardiology

## 2020-02-04 ENCOUNTER — Other Ambulatory Visit: Payer: Self-pay

## 2020-02-04 ENCOUNTER — Ambulatory Visit (INDEPENDENT_AMBULATORY_CARE_PROVIDER_SITE_OTHER): Payer: Medicaid Other | Admitting: Cardiology

## 2020-02-04 VITALS — BP 118/84 | HR 94 | Ht 64.0 in | Wt 282.0 lb

## 2020-02-04 DIAGNOSIS — I420 Dilated cardiomyopathy: Secondary | ICD-10-CM | POA: Diagnosis not present

## 2020-02-04 DIAGNOSIS — I10 Essential (primary) hypertension: Secondary | ICD-10-CM | POA: Diagnosis not present

## 2020-02-04 MED ORDER — AMLODIPINE BESYLATE 2.5 MG PO TABS: 2.5000 mg | ORAL_TABLET | Freq: Every day | ORAL | 3 refills | Status: DC

## 2020-02-04 NOTE — Patient Instructions (Addendum)
Medication Instructions:  DECREASE YOUR AMLODIPINE TO 2.5 MG DAILY  *If you need a refill on your cardiac medications before your next appointment, please call your pharmacy*  Lab Work: NONE   Testing/Procedures: NONE   Follow-Up: At [facility], you and your health needs are our priority.  As part of our continuing mission to provide you with exceptional heart care, we have created designated Provider Care Teams.  These Care Teams include your primary Cardiologist (physician) and Advanced Practice Providers (APPs -  Physician Assistants and Nurse Practitioners) who all work together to provide you with the care you need, when you need it.  We recommend signing up for the patient portal called "MyChart".  Sign up information is provided on this After Visit Summary.  MyChart is used to connect with patients for Virtual Visits (Telemedicine).  Patients are able to view lab/test results, encounter notes, upcoming appointments, etc.  Non-urgent messages can be sent to your provider as well.   To learn more about what you can do with MyChart, go to [email or web address].    Your next appointment:   6 month(s)  The format for your next appointment:   In Person  Provider:   You may see DR [facility] or one of the following Advanced Practice Providers on your designated Care Team:    Theodore Demark, PA-C  Joni Reining, DNP, ANP

## 2020-02-08 ENCOUNTER — Ambulatory Visit (INDEPENDENT_AMBULATORY_CARE_PROVIDER_SITE_OTHER): Payer: Medicaid Other | Admitting: Obstetrics

## 2020-02-08 ENCOUNTER — Other Ambulatory Visit (HOSPITAL_COMMUNITY)
Admission: RE | Admit: 2020-02-08 | Discharge: 2020-02-08 | Disposition: A | Discharge status: Home / Self Care | Payer: Medicaid Other | Source: Ambulatory Visit | Attending: Obstetrics | Admitting: Obstetrics

## 2020-02-08 ENCOUNTER — Other Ambulatory Visit: Payer: Self-pay

## 2020-02-08 ENCOUNTER — Encounter: Payer: Self-pay | Admitting: Obstetrics

## 2020-02-08 VITALS — BP 109/74 | HR 93 | Wt 282.2 lb

## 2020-02-08 DIAGNOSIS — N898 Other specified noninflammatory disorders of vagina: Secondary | ICD-10-CM

## 2020-02-08 DIAGNOSIS — Z8759 Personal history of other complications of pregnancy, childbirth and the puerperium: Secondary | ICD-10-CM

## 2020-02-08 DIAGNOSIS — Z01419 Encounter for gynecological examination (general) (routine) without abnormal findings: Secondary | ICD-10-CM | POA: Insufficient documentation

## 2020-02-08 DIAGNOSIS — Z9884 Bariatric surgery status: Secondary | ICD-10-CM | POA: Diagnosis not present

## 2020-02-08 DIAGNOSIS — Z86718 Personal history of other venous thrombosis and embolism: Secondary | ICD-10-CM

## 2020-02-08 DIAGNOSIS — I5032 Chronic diastolic (congestive) heart failure: Secondary | ICD-10-CM

## 2020-02-08 DIAGNOSIS — Z6841 Body Mass Index (BMI) 40.0 and over, adult: Secondary | ICD-10-CM

## 2020-02-08 NOTE — Progress Notes (Signed)
Pt presents for annual and pap smear. Pt c/o heavy menses, changing pads every 30-45 mins  BTL 2011 Normal pap 11/28/2018

## 2020-02-08 NOTE — Progress Notes (Signed)
Subjective:        Allison [name] is a 40 y.o. female here for a routine exam.  Current complaints: Heavy 7 day periods for the past year,  Also has vaginal discharge.Marland Kitchen Has history of morbid obesity, and recently had a Gastric Sleeve procedure.  PMH is significant for chronic diastolic CHF and cadiomyopathy with a pregnancy, DVT and PE.  Personal health questionnaire:  Is patient [name], have a family history of breast and/or ovarian cancer: no Is there a family history of uterine cancer diagnosed at age < 59, gastrointestinal cancer, urinary tract cancer, family member who is a Personnel officer syndrome-associated carrier: yes Is the patient overweight and hypertensive, family history of diabetes, personal history of gestational diabetes, preeclampsia or PCOS: yes Is patient over 38, have PCOS,  family history of premature CHD under age 60, diabetes, smoke, have hypertension or peripheral artery disease:  no At any time, has a partner hit, kicked or otherwise hurt or frightened you?: no Over the past 2 weeks, have you felt down, depressed or hopeless?: no Over the past 2 weeks, have you felt little interest or pleasure in doing things?:no   Gynecologic History Patient's last menstrual period was 12/30/2019. Contraception: tubal ligation Last Pap: 2020. Results were: normal Last mammogram: n/a. Results were: n/a  Obstetric History OB History  Gravida Para Term Preterm AB Living  3 3 3     3   SAB IAB Ectopic Multiple Live Births          3    # Outcome Date GA Lbr Len/2nd Weight Sex Delivery Anes PTL Lv  3 Term 06/28/09 [redacted]w[redacted]d  5 lb (2.268 kg) M CS-LTranv EPI N LIV     Birth Comments: PIH, Congestive Heart Failure  2 Term 04/16/05 [redacted]w[redacted]d  7 lb 8 oz (3.402 kg) F Vag-Spont EPI N LIV  1 Term 02/06/01 [redacted]w[redacted]d  5 lb 7 oz (2.466 kg) F Vag-Spont EPI N LIV    Past Medical History:  Diagnosis Date  . Arthritis   . 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 Carpal tunnel syndrome  of right wrist   . Chronic back pain   . Gallstones   . GERD (gastroesophageal reflux disease)    takes Omeprazole daily  . History of migraine   . Migraines   . Morbid obesity (HCC)   . Nausea    takes Phenergan as needed  . Osteoarthritis    takes Relafen daily  . PIH (pregnancy induced hypertension)    takes Coreg,Enalapril,and Amlodipine daily    Past Surgical History:  Procedure Laterality Date  . CARPAL TUNNEL RELEASE Right 08/02/2016   Procedure: CARPAL TUNNEL RELEASE RIGHT;  Surgeon: 08/04/2016, MD;  Location: [facility] OR;  Service: Neurosurgery;  Laterality: Right;  right  . CESAREAN SECTION  2011  . CHOLECYSTECTOMY N/A 01/23/2013   Procedure: LAPAROSCOPIC CHOLECYSTECTOMY;  Surgeon: 01/25/2013, MD;  Location: [facility] OR;  Service: General;  Laterality: N/A;  . LAPAROSCOPIC GASTRIC SLEEVE RESECTION  12/2019  . SHOULDER ARTHROSCOPY WITH ROTATOR CUFF REPAIR Right 11/24/2018   Procedure: SHOULDER ARTHROSCOPY WITH ROTATOR CUFF REPAIR subacromial decompression open biceps tenodesis;  Surgeon: 11/26/2018, MD;  Location: [facility] ORS;  Service: Orthopedics;  Laterality: Right;  interscalene block  . TUBAL LIGATION  2011     Current Outpatient Medications:  .  acetaminophen (TYLENOL) 500 MG tablet, Take 1,000 mg by mouth every 6 (six) hours as needed for moderate pain., Disp: , Rfl:  .  amitriptyline (  ELAVIL) 25 MG tablet, Take 25 mg by mouth at bedtime., Disp: , Rfl:  .  amLODipine (NORVASC) 2.5 MG tablet, Take 1 tablet (2.5 mg total) by mouth daily., Disp: 90 tablet, Rfl: 3 .  carvedilol (COREG) 25 MG tablet, TAKE 1 TABLET BY MOUTH TWICE A DAY WITH A MEAL, Disp: 180 tablet, Rfl: 3 .  cholecalciferol (VITAMIN D3) 25 MCG (1000 UT) tablet, Take 1,000 Units by mouth daily., Disp: , Rfl:  .  cyclobenzaprine (FLEXERIL) 10 MG tablet, Take 10 mg by mouth at bedtime., Disp: , Rfl:  .  DENTA 5000 PLUS 1.1 % CREA dental cream, Place 1 application onto teeth daily. , Disp: , Rfl: 6 .  ferrous sulfate  325 (65 FE) MG EC tablet, Take 325 mg by mouth daily. , Disp: , Rfl: 2 .  Multiple Vitamin (MULTIVITAMIN) tablet, Take 1 tablet by mouth daily., Disp: , Rfl:  .  omeprazole (PRILOSEC) 20 MG capsule, Take 20 mg by mouth daily before breakfast. , Disp: , Rfl:  .  oxyCODONE-acetaminophen (PERCOCET) 10-325 MG per tablet, Take 1 tablet by mouth 4 (four) times daily. , Disp: , Rfl: 0 .  pregabalin (LYRICA) 150 MG capsule, Take 150 mg by mouth 2 (two) times daily., Disp: , Rfl:  .  SUMAtriptan (IMITREX) 50 MG tablet, Take 50 mg by mouth daily as needed for migraine. May repeat in 2 hours if headache persists or recurs., Disp: , Rfl:  .  topiramate (TOPAMAX) 200 MG tablet, Take 200 mg by mouth 2 (two) times daily., Disp: , Rfl:  .  vitamin B-12 (CYANOCOBALAMIN) 1000 MCG tablet, Take 1,000 mcg by mouth daily., Disp: , Rfl:  .  tinidazole (TINDAMAX) 500 MG tablet, Take 2 tablets (1,000 mg total) by mouth daily with breakfast. (Patient not taking: Reported on 02/08/2020), Disp: 10 tablet, Rfl: 2 Allergies  Allergen Reactions  . Cymbalta [Duloxetine Hcl] Other (See Comments)    Paranoia/nauseous/anxious feeling.  . Reglan [Metoclopramide] Anxiety    Social History   Tobacco Use  . Smoking status: Never Smoker  . Smokeless tobacco: Never Used  Substance Use Topics  . Alcohol use: No    Alcohol/week: 0.0 standard drinks    Family History  Problem Relation Age of Onset  . Hypertension Mother   . Diabetes Mother   . Heart disease Mother   . Miscarriages / [place] Mother   . COPD Mother   . Arthritis Mother   . Alcohol abuse Father   . Asthma Father   . Diabetes Maternal Grandmother   . Heart disease Maternal Grandmother   . Cancer Maternal Grandfather        leukemia  . Arthritis Maternal Grandfather   . Cancer Paternal Grandmother        breast  . Cancer Paternal Grandfather        leukemia      Review of Systems  Constitutional: negative for fatigue and weight loss Respiratory:  negative for cough and wheezing Cardiovascular: negative for chest pain, fatigue and palpitations Gastrointestinal: negative for abdominal pain and change in bowel habits Musculoskeletal:negative for myalgias Neurological: negative for gait problems and tremors Behavioral/Psych: negative for abusive relationship, depression Endocrine: negative for temperature intolerance    Genitourinary: positive for abnormal menstrual periods and vaginal discharge.  Negative for genital lesions, hot flashes, sexual problems.   Integument/breast: negative for breast lump, breast tenderness, nipple discharge and skin lesion(s)    Objective:       BP 109/74   Pulse  93   Wt 282 lb 3.2 oz (128 kg)   LMP 12/30/2019   BMI 48.44 kg/m  General:   alert and no distress  Skin:   no rash or abnormalities  Lungs:   clear to auscultation bilaterally  Heart:   regular rate and rhythm, S1, S2 normal, no murmur, click, rub or gallop  Breasts:   normal without suspicious masses, skin or nipple changes or axillary nodes  Abdomen:   Obese withnormal findings: no organomegaly, soft, non-tender and no hernia  Pelvis:  External genitalia: normal general appearance Urinary system: urethral meatus normal and bladder without fullness, nontender Vaginal: normal without tenderness, induration or masses Cervix: normal appearance Adnexa: normal bimanual exam Uterus: anteverted and non-tender, normal size   Lab Review Urine pregnancy test Labs reviewed yes Radiologic studies reviewed yes  50% of 20 min visit spent on counseling and coordination of care.   Assessment:     1. Encounter for gynecological examination with Papanicolaou smear of cervix Rx: - Cytology - PAP( [facility])  2. Vaginal discharge Rx: - Cervicovaginal ancillary only  3. Class 3 severe obesity due to excess calories with serious comorbidity and body mass index (BMI) of 45.0 to 49.9 in adult (HCC)  4. Hx of gastric bypass  5. Chronic  diastolic congestive heart failure, NYHA class 1 (HCC)  6. History of maternal deep vein thrombosis (DVT)    Plan:    Education reviewed: calcium supplements, depression evaluation, low fat, low cholesterol diet, safe sex/STD prevention, self breast exams and weight bearing exercise. Follow up in: 6 months.    Brock Bad, MD 02/08/2020 2:30 PM

## 2020-02-09 LAB — CERVICOVAGINAL ANCILLARY ONLY
Bacterial Vaginitis (gardnerella): NEGATIVE
Candida Glabrata: NEGATIVE
Candida Vaginitis: NEGATIVE
Chlamydia: NEGATIVE
Comment: NEGATIVE
Comment: NEGATIVE
Comment: NEGATIVE
Comment: NEGATIVE
Comment: NEGATIVE
Comment: NORMAL
Neisseria Gonorrhea: NEGATIVE
Trichomonas: NEGATIVE

## 2020-02-09 LAB — CYTOLOGY - PAP
Adequacy: ABSENT
Comment: NEGATIVE
Diagnosis: NEGATIVE
High risk HPV: NEGATIVE

## 2021-02-01 NOTE — Progress Notes (Signed)
Cardiology Office Note   Date:  02/02/2021   ID:  Allison [name], DOB 06-10-1980, MRN [id number]  PCP:  Jackie Plum, MD  Cardiologist:   Rollene Rotunda, MD   Chief Complaint  Patient presents with   Cardiomyopathy      History of Present Illness: Allison [name] is a 41 y.o. female who presents for followup of nonischemic cardiomyopathy with an ejection fraction as low as 25% followup echo in 2012 and echo in Dec 2014 demonstrated it was up to 60%.  She had bariatric surgery.  She is lost a total of about 65 pounds.  Since I last saw her she feels well.  She denies any cardiovascular symptoms.  She is not having any shortness of breath, PND or orthopnea.  She is not having any palpitations, presyncope or syncope.  She has had no weight gain or edema.     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   Carpal tunnel syndrome of right wrist    Chronic back pain    Gallstones    GERD (gastroesophageal reflux disease)    takes Omeprazole daily   History of migraine    Migraines    Morbid obesity (HCC)    Nausea    takes Phenergan as needed   Osteoarthritis    takes Relafen daily   PIH (pregnancy induced hypertension)    takes Coreg,Enalapril,and Amlodipine daily    Past Surgical History:  Procedure Laterality Date   CARPAL TUNNEL RELEASE Right 08/02/2016   Procedure: CARPAL TUNNEL RELEASE RIGHT;  Surgeon: Coletta Memos, MD;  Location: [facility] OR;  Service: Neurosurgery;  Laterality: Right;  right   CESAREAN SECTION  2011   CHOLECYSTECTOMY N/A 01/23/2013   Procedure: LAPAROSCOPIC CHOLECYSTECTOMY;  Surgeon: Atilano Ina, MD;  Location: [facility] OR;  Service: General;  Laterality: N/A;   LAPAROSCOPIC GASTRIC SLEEVE RESECTION  12/2019   SHOULDER ARTHROSCOPY WITH ROTATOR CUFF REPAIR Right 11/24/2018   Procedure: SHOULDER ARTHROSCOPY WITH ROTATOR CUFF REPAIR subacromial decompression open biceps tenodesis;  Surgeon:  Beverely Low, MD;  Location: [facility] ORS;  Service: Orthopedics;  Laterality: Right;  interscalene block   TUBAL LIGATION  2011     Current Outpatient Medications  Medication Sig Dispense Refill   acetaminophen (TYLENOL) 500 MG tablet Take 1,000 mg by mouth every 6 (six) hours as needed for moderate pain.     amitriptyline (ELAVIL) 25 MG tablet Take 25 mg by mouth at bedtime.     carvedilol (COREG) 25 MG tablet TAKE 1 TABLET BY MOUTH TWICE A DAY WITH A MEAL 180 tablet 3   cholecalciferol (VITAMIN D3) 25 MCG (1000 UT) tablet Take 1,000 Units by mouth daily.     cyclobenzaprine (FLEXERIL) 10 MG tablet Take 10 mg by mouth at bedtime.     DENTA 5000 PLUS 1.1 % CREA dental cream Place 1 application onto teeth daily.   6   ferrous sulfate 325 (65 FE) MG EC tablet Take 325 mg by mouth daily.   2   Multiple Vitamin (MULTIVITAMIN) tablet Take 1 tablet by mouth daily.     omeprazole (PRILOSEC) 40 MG capsule Take 40 mg by mouth in the morning and at bedtime.     oxyCODONE-acetaminophen (PERCOCET) 10-325 MG per tablet Take 1 tablet by mouth 4 (four) times daily.   0   pregabalin (LYRICA) 150 MG capsule Take 150 mg by mouth 2 (two) times daily.  topiramate (TOPAMAX) 200 MG tablet Take 200 mg by mouth 2 (two) times daily.     vitamin B-12 (CYANOCOBALAMIN) 1000 MCG tablet Take 1,000 mcg by mouth daily.     naloxone (NARCAN) nasal spray 4 mg/0.1 mL Place into the nose. (Patient not taking: Reported on 02/02/2021)     omeprazole (PRILOSEC) 20 MG capsule Take 20 mg by mouth daily before breakfast.  (Patient not taking: Reported on 02/02/2021)     SUMAtriptan (IMITREX) 50 MG tablet Take 50 mg by mouth daily as needed for migraine. May repeat in 2 hours if headache persists or recurs. (Patient not taking: Reported on 02/02/2021)     tinidazole (TINDAMAX) 500 MG tablet Take 2 tablets (1,000 mg total) by mouth daily with breakfast. (Patient not taking: Reported on 02/08/2020) 10 tablet 2   No current  facility-administered medications for this visit.    Allergies:   Cymbalta [duloxetine hcl] and Reglan [metoclopramide]     ROS:  Please see the history of present illness.   Otherwise, review of systems are positive for none.   All other systems are reviewed and negative.    PHYSICAL EXAM: VS:  BP 110/84 (BP Location: Left Arm, Patient Position: Sitting, Cuff Size: Large)    Pulse 80    Ht 5\' 5"  (1.651 m)    Wt 255 lb 12.8 oz (116 kg)    BMI 42.57 kg/m  , BMI Body mass index is 42.57 kg/m. GENERAL:  Well appearing NECK:  No jugular venous distention, waveform within normal limits, carotid upstroke brisk and symmetric, no bruits, no thyromegaly LUNGS:  Clear to auscultation bilaterally CHEST:  Unremarkable HEART:  PMI not displaced or sustained,S1 and S2 within normal limits, no S3, no S4, no clicks, no rubs, no murmurs ABD:  Flat, positive bowel sounds normal in frequency in pitch, no bruits, no rebound, no guarding, no midline pulsatile mass, no hepatomegaly, no splenomegaly EXT:  2 plus pulses throughout, no edema, no cyanosis no clubbing   EKG:  EKG is ordered today. The ekg ordered today demonstrates NSR, rate 82, axis within normal limits, intervals within normal limits, no acute ST-T wave changes.   Recent Labs: No results found for requested labs within last 8760 hours.    Lipid Panel No results found for: CHOL, TRIG, HDL, CHOLHDL, VLDL, LDLCALC, LDLDIRECT    Wt Readings from Last 3 Encounters:  02/02/21 255 lb 12.8 oz (116 kg)  02/08/20 282 lb 3.2 oz (128 kg)  02/04/20 282 lb (127.9 kg)      Other studies Reviewed: Additional studies/ records that were reviewed today include: None. Review of the above records demonstrates:  NA   ASSESSMENT AND PLAN:  CARDIOMYOPATHY:    Her EF was improved in 2018.  I would not suspect that this was different than the the most recent echo based on her absence of symptoms.  No further imaging.   ESSENTIAL HYPERTENSION:  Her  BP is continuing to trend down as she has lost weight.  At the last visit I reduced her Norvasc and today I will stop it.    OBESITY, MORBID    She continues to lose weight.    Current medicines are reviewed at length with the patient today.  The patient does not have concerns regarding medicines.  The following changes have been made: As above  Labs/ tests ordered today include: None  Orders Placed This Encounter  Procedures   EKG 12-Lead     Disposition:   FU  with me in 12 months.     Signed, Rollene RotundaJames Mckensey Berghuis, MD  02/02/2021 3:25 PM    [facility]

## 2021-02-02 ENCOUNTER — Ambulatory Visit (INDEPENDENT_AMBULATORY_CARE_PROVIDER_SITE_OTHER): Payer: Medicaid Other | Admitting: Cardiology

## 2021-02-02 ENCOUNTER — Encounter: Payer: Self-pay | Admitting: Cardiology

## 2021-02-02 ENCOUNTER — Other Ambulatory Visit: Payer: Self-pay

## 2021-02-02 VITALS — BP 110/84 | HR 80 | Ht 65.0 in | Wt 255.8 lb

## 2021-02-02 DIAGNOSIS — I10 Essential (primary) hypertension: Secondary | ICD-10-CM | POA: Diagnosis not present

## 2021-02-02 DIAGNOSIS — I420 Dilated cardiomyopathy: Secondary | ICD-10-CM | POA: Diagnosis not present

## 2021-02-02 NOTE — Patient Instructions (Signed)
Medication Instructions:  Your Physician recommend you continue on your current medication as directed.   Stop taking norvasc. *If you need a refill on your cardiac medications before your next appointment, please call your pharmacy*   Follow-Up: At [facility], you and your health needs are our priority.  As part of our continuing mission to provide you with exceptional heart care, we have created designated Provider Care Teams.  These Care Teams include your primary Cardiologist (physician) and Advanced Practice Providers (APPs -  Physician Assistants and Nurse Practitioners) who all work together to provide you with the care you need, when you need it.  We recommend signing up for the patient portal called "MyChart".  Sign up information is provided on this After Visit Summary.  MyChart is used to connect with patients for Virtual Visits (Telemedicine).  Patients are able to view lab/test results, encounter notes, upcoming appointments, etc.  Non-urgent messages can be sent to your provider as well.   To learn more about what you can do with MyChart, go to [email or web address].    Your next appointment:   1 year(s)  The format for your next appointment:   In Person  Provider:   Rollene Rotunda, MD

## 2021-02-09 ENCOUNTER — Other Ambulatory Visit: Payer: Self-pay

## 2021-02-09 ENCOUNTER — Other Ambulatory Visit (HOSPITAL_COMMUNITY)
Admission: RE | Admit: 2021-02-09 | Discharge: 2021-02-09 | Disposition: A | Discharge status: Home / Self Care | Payer: Medicaid Other | Source: Ambulatory Visit | Attending: Obstetrics | Admitting: Obstetrics

## 2021-02-09 ENCOUNTER — Encounter: Payer: Self-pay | Admitting: Obstetrics

## 2021-02-09 ENCOUNTER — Ambulatory Visit (INDEPENDENT_AMBULATORY_CARE_PROVIDER_SITE_OTHER): Payer: Medicaid Other | Admitting: Obstetrics

## 2021-02-09 VITALS — BP 117/80 | HR 89 | Ht 65.0 in | Wt 255.0 lb

## 2021-02-09 DIAGNOSIS — Z113 Encounter for screening for infections with a predominantly sexual mode of transmission: Secondary | ICD-10-CM

## 2021-02-09 DIAGNOSIS — N898 Other specified noninflammatory disorders of vagina: Secondary | ICD-10-CM | POA: Insufficient documentation

## 2021-02-09 DIAGNOSIS — Z01419 Encounter for gynecological examination (general) (routine) without abnormal findings: Secondary | ICD-10-CM | POA: Diagnosis present

## 2021-02-09 DIAGNOSIS — E66813 Obesity, class 3: Secondary | ICD-10-CM

## 2021-02-09 DIAGNOSIS — Z6841 Body Mass Index (BMI) 40.0 and over, adult: Secondary | ICD-10-CM

## 2021-02-09 NOTE — Progress Notes (Signed)
Patient presents for AEX. Patient denies having any other issues or concerns. Patient would like std testing including yeast and bv, and blood blood work.  Last pap 02/08/2020 Normal

## 2021-02-09 NOTE — Progress Notes (Signed)
Subjective:        Allison [name] is a 41 y.o. female here for a routine exam.  Current complaints: Vaginal discharge.    Personal health questionnaire:  Is patient [name], have a family history of breast and/or ovarian cancer: no Is there a family history of uterine cancer diagnosed at age < 57, gastrointestinal cancer, urinary tract cancer, family member who is a Field seismologist syndrome-associated carrier: no Is the patient overweight and hypertensive, family history of diabetes, personal history of gestational diabetes, preeclampsia or PCOS: no Is patient over 50, have PCOS,  family history of premature CHD under age 49, diabetes, smoke, have hypertension or peripheral artery disease:  no At any time, has a partner hit, kicked or otherwise hurt or frightened you?: no Over the past 2 weeks, have you felt down, depressed or hopeless?: no Over the past 2 weeks, have you felt little interest or pleasure in doing things?:no   Gynecologic History Patient's last menstrual period was 01/31/2021. Contraception: tubal ligation Last Pap: 02-08-2020. Results were: normal Last mammogram: n/a. Results were: n/a  Obstetric History OB History  Gravida Para Term Preterm AB Living  3 3 3     3   SAB IAB Ectopic Multiple Live Births          3    # Outcome Date GA Lbr Len/2nd Weight Sex Delivery Anes PTL Lv  3 Term 06/28/09 [redacted]w[redacted]d  5 lb (2.268 kg) M CS-LTranv EPI N LIV     Birth Comments: PIH, Congestive Heart Failure  2 Term 04/16/05 [redacted]w[redacted]d  7 lb 8 oz (3.402 kg) F Vag-Spont EPI N LIV  1 Term 02/06/01 [redacted]w[redacted]d  5 lb 7 oz (2.466 kg) F Vag-Spont EPI N LIV    Past Medical History:  Diagnosis Date   Arthritis    Cardiomyopathy, peripartum, postpartum 06/08/2009   Carpal tunnel syndrome of right wrist    Chronic back pain    Gallstones    GERD (gastroesophageal reflux disease)    takes Omeprazole daily   History of migraine    Migraines    Morbid obesity (HCC)    Nausea    takes  Phenergan as needed   Osteoarthritis    takes Relafen daily   PIH (pregnancy induced hypertension)    takes Coreg,Enalapril,and Amlodipine daily    Past Surgical History:  Procedure Laterality Date   CARPAL TUNNEL RELEASE Right 08/02/2016   Procedure: CARPAL TUNNEL RELEASE RIGHT;  Surgeon: Ashok Pall, MD;  Location: [facility];  Service: Neurosurgery;  Laterality: Right;  right   CESAREAN SECTION  2011   CHOLECYSTECTOMY N/A 01/23/2013   Procedure: LAPAROSCOPIC CHOLECYSTECTOMY;  Surgeon: Gayland Curry, MD;  Location: [facility];  Service: General;  Laterality: N/A;   LAPAROSCOPIC GASTRIC SLEEVE RESECTION  12/2019   SHOULDER ARTHROSCOPY WITH ROTATOR CUFF REPAIR Right 11/24/2018   Procedure: SHOULDER ARTHROSCOPY WITH ROTATOR CUFF REPAIR subacromial decompression open biceps tenodesis;  Surgeon: Netta Cedars, MD;  Location: [facility];  Service: Orthopedics;  Laterality: Right;  interscalene block   TUBAL LIGATION  2011     Current Outpatient Medications:    acetaminophen (TYLENOL) 500 MG tablet, Take 1,000 mg by mouth every 6 (six) hours as needed for moderate pain., Disp: , Rfl:    amitriptyline (ELAVIL) 25 MG tablet, Take 25 mg by mouth at bedtime., Disp: , Rfl:    carvedilol (COREG) 25 MG tablet, TAKE 1 TABLET BY MOUTH TWICE A DAY WITH A MEAL, Disp: 180 tablet, Rfl:  3   cholecalciferol (VITAMIN D3) 25 MCG (1000 UT) tablet, Take 1,000 Units by mouth daily., Disp: , Rfl:    cyclobenzaprine (FLEXERIL) 10 MG tablet, Take 10 mg by mouth at bedtime., Disp: , Rfl:    DENTA 5000 PLUS 1.1 % CREA dental cream, Place 1 application onto teeth daily. , Disp: , Rfl: 6   ferrous sulfate 325 (65 FE) MG EC tablet, Take 325 mg by mouth daily. , Disp: , Rfl: 2   Multiple Vitamin (MULTIVITAMIN) tablet, Take 1 tablet by mouth daily., Disp: , Rfl:    naloxone (NARCAN) nasal spray 4 mg/0.1 mL, Place into the nose., Disp: , Rfl:    omeprazole (PRILOSEC) 40 MG capsule, Take 40 mg by mouth in the morning and at bedtime.,  Disp: , Rfl:    oxyCODONE-acetaminophen (PERCOCET) 10-325 MG per tablet, Take 1 tablet by mouth 4 (four) times daily. , Disp: , Rfl: 0   pregabalin (LYRICA) 150 MG capsule, Take 150 mg by mouth 2 (two) times daily., Disp: , Rfl:    SUMAtriptan (IMITREX) 50 MG tablet, Take 50 mg by mouth daily as needed for migraine. May repeat in 2 hours if headache persists or recurs., Disp: , Rfl:    topiramate (TOPAMAX) 200 MG tablet, Take 200 mg by mouth 2 (two) times daily., Disp: , Rfl:    vitamin B-12 (CYANOCOBALAMIN) 1000 MCG tablet, Take 1,000 mcg by mouth daily., Disp: , Rfl:    metroNIDAZOLE (FLAGYL) 500 MG tablet, Take 1 tablet (500 mg total) by mouth 2 (two) times daily., Disp: 14 tablet, Rfl: 2 Allergies  Allergen Reactions   Cymbalta [Duloxetine Hcl] Other (See Comments)    Paranoia/nauseous/anxious feeling.   Reglan [Metoclopramide] Anxiety    Social History   Tobacco Use   Smoking status: Never   Smokeless tobacco: Never  Substance Use Topics   Alcohol use: No    Alcohol/week: 0.0 standard drinks    Family History  Problem Relation Age of Onset   Hypertension Mother    Diabetes Mother    Heart disease Mother    Miscarriages / [place] Mother    COPD Mother    Arthritis Mother    Alcohol abuse Father    Asthma Father    Diabetes Maternal Grandmother    Heart disease Maternal Grandmother    Cancer Maternal Grandfather        leukemia   Arthritis Maternal Grandfather    Cancer Paternal Grandmother        breast   Cancer Paternal Grandfather        leukemia      Review of Systems  Constitutional: negative for fatigue and weight loss Respiratory: negative for cough and wheezing Cardiovascular: negative for chest pain, fatigue and palpitations Gastrointestinal: negative for abdominal pain and change in bowel habits Musculoskeletal:negative for myalgias Neurological: negative for gait problems and tremors Behavioral/Psych: negative for abusive relationship,  depression Endocrine:  negativ  e for temperature intolerance    Genitourinary:positive for vaginal discharge.  negative for abnormal menstrual periods, genital lesions, hot flashes, sexual problems Integument/breast: negative for breast lump, breast tenderness, nipple discharge and skin lesion(s)    Objective:       BP 117/80    Pulse 89    Ht 5\' 5"  (1.651 m)    Wt 255 lb (115.7 kg)    LMP 01/31/2021    BMI 42.43 kg/m  General:   Alert and no distress  Skin:   no rash or abnormalities  Lungs:  clear to auscultation bilaterally  Heart:   regular rate and rhythm, S1, S2 normal, no murmur, click, rub or gallop  Breasts:   normal without suspicious masses, skin or nipple changes or axillary nodes  Abdomen:  normal findings: no organomegaly, soft, non-tender and no hernia  Pelvis:  External genitalia: normal general appearance Urinary system: urethral meatus normal and bladder without fullness, nontender Vaginal: normal without tenderness, induration or masses Cervix: normal appearance Adnexa: normal bimanual exam Uterus: anteverted and non-tender, normal size   Lab Review Urine pregnancy test Labs reviewed yes Radiologic studies reviewed yes  I have spent a total of 20 minutes of face-to-face time, excluding clinical staff time, reviewing notes and preparing to see patient, ordering tests and/or medications, and counseling the patient.   Assessment:     .1. Encounter for gynecological examination with Papanicolaou smear of cervix Rx: - Cytology - PAP( [facility])  2. Vaginal discharge Rx: - Cervicovaginal ancillary only( [facility])  3. Screen for STD (sexually transmitted disease) Rx: - Hepatitis B surface antigen - Hepatitis C antibody - HIV Antibody (routine testing w rflx) - [facility]  4. Class 3 severe obesity without serious comorbidity with body mass index (BMI) of 40.0 to 44.9 in adult, unspecified obesity type ([place]) - weight reduction with the aid od dietary  changes, exercise and behavioral modification recommended    Plan:    Education reviewed: calcium supplements, depression evaluation, low fat, low cholesterol diet, safe sex/STD prevention, self breast exams, and weight bearing exercise. Mammogram ordered. Follow up in: 1 year.    Orders Placed This Encounter  Procedures   Hepatitis B surface antigen   Hepatitis C antibody   HIV Antibody (routine testing w rflx)   RPR      Shelly Bombard, MD 02-09-2021

## 2021-02-10 LAB — CERVICOVAGINAL ANCILLARY ONLY
Bacterial Vaginitis (gardnerella): POSITIVE — AB
Candida Glabrata: NEGATIVE
Candida Vaginitis: NEGATIVE
Chlamydia: NEGATIVE
Comment: NEGATIVE
Comment: NEGATIVE
Comment: NEGATIVE
Comment: NEGATIVE
Comment: NEGATIVE
Comment: NORMAL
Neisseria Gonorrhea: NEGATIVE
Trichomonas: NEGATIVE

## 2021-02-10 LAB — RPR: RPR Ser Ql: NONREACTIVE

## 2021-02-10 LAB — HEPATITIS C ANTIBODY: Hep C Virus Ab: <0.1 s/co ratio (ref 0.0–0.9)

## 2021-02-10 LAB — CYTOLOGY - PAP: Diagnosis: NEGATIVE

## 2021-02-10 LAB — HIV ANTIBODY (ROUTINE TESTING W REFLEX): HIV Screen 4th Generation wRfx: NONREACTIVE

## 2021-02-10 LAB — HEPATITIS B SURFACE ANTIGEN: Hepatitis B Surface Ag: NEGATIVE

## 2021-02-13 ENCOUNTER — Other Ambulatory Visit: Payer: Self-pay | Admitting: Obstetrics

## 2021-02-13 DIAGNOSIS — B9689 Other specified bacterial agents as the cause of diseases classified elsewhere: Secondary | ICD-10-CM

## 2021-02-13 DIAGNOSIS — N760 Acute vaginitis: Secondary | ICD-10-CM

## 2021-02-13 MED ORDER — METRONIDAZOLE 500 MG PO TABS: 500.0000 mg | ORAL_TABLET | Freq: Two times a day (BID) | ORAL | 2 refills | Status: DC

## 2021-02-14 ENCOUNTER — Encounter: Payer: Self-pay | Admitting: Obstetrics

## 2021-04-19 ENCOUNTER — Other Ambulatory Visit: Payer: Self-pay | Admitting: Cardiology

## 2021-04-20 NOTE — Telephone Encounter (Signed)
The original prescription was discontinued on 02/02/2021 by Kathreen Devoid, RN for the following reason: Discontinued by provider ?

## 2022-02-18 NOTE — Progress Notes (Unsigned)
Cardiology Office Note   Date:  02/20/2022   ID:  Allison [name], DOB February 27, 1980, MRN [id number]  PCP:  Benito Mccreedy, MD  Cardiologist:   Minus Breeding, MD   Chief Complaint  Patient presents with   Cardiomyopathy      History of Present Illness: Allison [name] is a 42 y.o. female who presents for followup of nonischemic cardiomyopathy with an ejection fraction as low as 25% followup echo in 2012 and echo in Dec 2014 demonstrated it was up to 60%.  She had bariatric surgery.  She is lost a total of about 120 pounds.   Since I last saw her she has done well.  She works in [employer].   She exercises at the gym on the treadmill.  The patient denies any new symptoms such as chest discomfort, neck or arm discomfort. There has been no new shortness of breath, PND or orthopnea. There have been no reported palpitations, presyncope or syncope.    Past Medical History:  Diagnosis Date   Arthritis    Cardiomyopathy, peripartum, postpartum 06/08/2009   Carpal tunnel syndrome of right wrist    Chronic back pain    Gallstones    GERD (gastroesophageal reflux disease)    takes Omeprazole daily   History of migraine    Migraines    Morbid obesity (HCC)    Nausea    takes Phenergan as needed   Osteoarthritis    takes Relafen daily   PIH (pregnancy induced hypertension)    takes Coreg,Enalapril,and Amlodipine daily    Past Surgical History:  Procedure Laterality Date   CARPAL TUNNEL RELEASE Right 08/02/2016   Procedure: CARPAL TUNNEL RELEASE RIGHT;  Surgeon: Ashok Pall, MD;  Location: [facility];  Service: Neurosurgery;  Laterality: Right;  right   CESAREAN SECTION  2011   CHOLECYSTECTOMY N/A 01/23/2013   Procedure: LAPAROSCOPIC CHOLECYSTECTOMY;  Surgeon: Gayland Curry, MD;  Location: [facility];  Service: General;  Laterality: N/A;   LAPAROSCOPIC GASTRIC SLEEVE RESECTION  12/2019   SHOULDER ARTHROSCOPY WITH ROTATOR CUFF REPAIR Right 11/24/2018   Procedure: SHOULDER  ARTHROSCOPY WITH ROTATOR CUFF REPAIR subacromial decompression open biceps tenodesis;  Surgeon: Netta Cedars, MD;  Location: [facility];  Service: Orthopedics;  Laterality: Right;  interscalene block   TUBAL LIGATION  2011     Current Outpatient Medications  Medication Sig Dispense Refill   acetaminophen (TYLENOL) 500 MG tablet Take 1,000 mg by mouth every 6 (six) hours as needed for moderate pain.     amitriptyline (ELAVIL) 25 MG tablet Take 25 mg by mouth at bedtime.     carvedilol (COREG) 25 MG tablet TAKE 1 TABLET BY MOUTH TWICE A DAY WITH A MEAL 180 tablet 3   cholecalciferol (VITAMIN D3) 25 MCG (1000 UT) tablet Take 1,000 Units by mouth daily.     cyclobenzaprine (FLEXERIL) 10 MG tablet Take 10 mg by mouth at bedtime.     DENTA 5000 PLUS 1.1 % CREA dental cream Place 1 application onto teeth daily.   6   famotidine (PEPCID) 20 MG tablet Take 20 mg by mouth 2 (two) times daily.     ferrous sulfate 325 (65 FE) MG EC tablet Take 325 mg by mouth daily.   2   metroNIDAZOLE (FLAGYL) 500 MG tablet Take 1 tablet (500 mg total) by mouth 2 (two) times daily. 14 tablet 2   Multiple Vitamin (MULTIVITAMIN) tablet Take 1 tablet by mouth daily.     omeprazole (PRILOSEC)  40 MG capsule Take 40 mg by mouth in the morning and at bedtime.     oxyCODONE-acetaminophen (PERCOCET) 10-325 MG per tablet Take 1 tablet by mouth 4 (four) times daily.   0   pregabalin (LYRICA) 150 MG capsule Take 150 mg by mouth 2 (two) times daily.     senna-docusate (SENOKOT-S) 8.6-50 MG tablet Take 1 tablet by mouth at bedtime as needed.     topiramate (TOPAMAX) 200 MG tablet Take 200 mg by mouth 2 (two) times daily.     triamcinolone cream (KENALOG) 0.5 % Apply 1 Application topically as needed (E[place]cema).     vitamin B-12 (CYANOCOBALAMIN) 1000 MCG tablet Take 1,000 mcg by mouth daily.     naloxone (NARCAN) nasal spray 4 mg/0.1 mL Place into the nose. (Patient not taking: Reported on 02/20/2022)     SUMAtriptan (IMITREX) 50 MG tablet  Take 50 mg by mouth daily as needed for migraine. May repeat in 2 hours if headache persists or recurs. (Patient not taking: Reported on 02/20/2022)     No current facility-administered medications for this visit.    Allergies:   Cymbalta [duloxetine hcl] and Reglan [metoclopramide]     ROS:  Please see the history of present illness.   Otherwise, review of systems are positive for none.   All other systems are reviewed and negative.    PHYSICAL EXAM: VS:  BP 106/70 (BP Location: Left Arm, Patient Position: Sitting, Cuff Size: Large)   Pulse 96   Ht 5' 5"$  (1.651 m)   Wt 258 lb (117 kg)   SpO2 99%   BMI 42.93 kg/m  , BMI Body mass index is 42.93 kg/m. GENERAL:  Well appearing NECK:  No jugular venous distention, waveform within normal limits, carotid upstroke brisk and symmetric, no bruits, no thyromegaly LUNGS:  Clear to auscultation bilaterally CHEST:  Unremarkable HEART:  PMI not displaced or sustained,S1 and S2 within normal limits, no S3, no S4, no clicks, no rubs, no murmurs ABD:  Flat, positive bowel sounds normal in frequency in pitch, no bruits, no rebound, no guarding, no midline pulsatile mass, no hepatomegaly, no splenomegaly EXT:  2 plus pulses throughout, no edema, no cyanosis no clubbing   EKG:  EKG is ordered today. The ekg ordered today demonstrates NSR, rate 96, axis within normal limits, intervals within normal limits, no acute ST-T wave changes.   Recent Labs: No results found for requested labs within last 365 days.    Lipid Panel No results found for: "CHOL", "TRIG", "HDL", "CHOLHDL", "VLDL", "LDLCALC", "LDLDIRECT"    Wt Readings from Last 3 Encounters:  02/20/22 258 lb (117 kg)  02/09/21 255 lb (115.7 kg)  02/02/21 255 lb 12.8 oz (116 kg)      Other studies Reviewed: Additional studies/ records that were reviewed today include: Labs from PCP. Review of the above records demonstrates:  NA   ASSESSMENT AND PLAN:  CARDIOMYOPATHY:    Her EF was  improved in 2018.  I would not suspect this to be changed clinically.  No further imaging.  ESSENTIAL HYPERTENSION:   Her blood pressure runs low.  I had to back off on medications because of this.  She has not tolerated med titration which she had excellent response with ejection fraction.     OBESITY, MORBID    she has lost a total of 120 pounds.  No change in therapy   Current medicines are reviewed at length with the patient today.  The patient does not have  concerns regarding medicines.  The following changes have been made:   None  Labs/ tests ordered today include: None  Orders Placed This Encounter  Procedures   EKG 12-Lead     Disposition:   FU with me in 12 months.     Signed, Minus Breeding, MD  02/20/2022 5:06 PM    [facility]

## 2022-02-20 ENCOUNTER — Encounter: Payer: Self-pay | Admitting: Cardiology

## 2022-02-20 ENCOUNTER — Ambulatory Visit: Payer: Medicaid Other | Attending: Cardiology | Admitting: Cardiology

## 2022-02-20 VITALS — BP 106/70 | HR 96 | Ht 65.0 in | Wt 258.0 lb

## 2022-02-20 DIAGNOSIS — I10 Essential (primary) hypertension: Secondary | ICD-10-CM | POA: Diagnosis not present

## 2022-02-20 DIAGNOSIS — I420 Dilated cardiomyopathy: Secondary | ICD-10-CM

## 2022-02-20 NOTE — Patient Instructions (Signed)
   Follow-Up: At [facility], you and your health needs are our priority.  As part of our continuing mission to provide you with exceptional heart care, we have created designated Provider Care Teams.  These Care Teams include your primary Cardiologist (physician) and Advanced Practice Providers (APPs -  Physician Assistants and Nurse Practitioners) who all work together to provide you with the care you need, when you need it.  We recommend signing up for the patient portal called "MyChart".  Sign up information is provided on this After Visit Summary.  MyChart is used to connect with patients for Virtual Visits (Telemedicine).  Patients are able to view lab/test results, encounter notes, upcoming appointments, etc.  Non-urgent messages can be sent to your provider as well.   To learn more about what you can do with MyChart, go to [email or web address].    Your next appointment:   12 month(s)  Provider:   James Hochrein, MD      

## 2022-03-20 ENCOUNTER — Ambulatory Visit (INDEPENDENT_AMBULATORY_CARE_PROVIDER_SITE_OTHER): Payer: Medicaid Other | Admitting: Obstetrics

## 2022-03-20 ENCOUNTER — Other Ambulatory Visit (HOSPITAL_COMMUNITY)
Admission: RE | Admit: 2022-03-20 | Discharge: 2022-03-20 | Disposition: A | Discharge status: Home / Self Care | Payer: Medicaid Other | Source: Ambulatory Visit | Attending: Obstetrics | Admitting: Obstetrics

## 2022-03-20 ENCOUNTER — Encounter: Payer: Self-pay | Admitting: Obstetrics

## 2022-03-20 VITALS — BP 107/72 | HR 92 | Ht 65.0 in | Wt 255.7 lb

## 2022-03-20 DIAGNOSIS — N898 Other specified noninflammatory disorders of vagina: Secondary | ICD-10-CM | POA: Insufficient documentation

## 2022-03-20 DIAGNOSIS — Z113 Encounter for screening for infections with a predominantly sexual mode of transmission: Secondary | ICD-10-CM | POA: Diagnosis not present

## 2022-03-20 DIAGNOSIS — Z1239 Encounter for other screening for malignant neoplasm of breast: Secondary | ICD-10-CM

## 2022-03-20 DIAGNOSIS — Z01419 Encounter for gynecological examination (general) (routine) without abnormal findings: Secondary | ICD-10-CM

## 2022-03-20 DIAGNOSIS — N939 Abnormal uterine and vaginal bleeding, unspecified: Secondary | ICD-10-CM

## 2022-03-20 NOTE — Progress Notes (Signed)
Pt presents for annual, pap, and all STD testing.

## 2022-03-20 NOTE — Progress Notes (Signed)
Subjective:        Allison [name] is a 42 y.o. female here for a routine exam.  Current complaints: Vaginal discharge and heavy periods with clots.  Has been severely anemic, and has taking iron.  Personal health questionnaire:  Is patient [name], have a family history of breast and/or ovarian cancer: yes Is there a family history of uterine cancer diagnosed at age < 30, gastrointestinal cancer, urinary tract cancer, family member who is a Field seismologist syndrome-associated carrier: no Is the patient overweight and hypertensive, family history of diabetes, personal history of gestational diabetes, preeclampsia or PCOS: yes Is patient over 86, have PCOS,  family history of premature CHD under age 99, diabetes, smoke, have hypertension or peripheral artery disease:  no At any time, has a partner hit, kicked or otherwise hurt or frightened you?: no Over the past 2 weeks, have you felt down, depressed or hopeless?: no Over the past 2 weeks, have you felt little interest or pleasure in doing things?:no   Gynecologic History Patient's last menstrual period was 02/27/2022. Contraception: tubal ligation Last Pap: 2023. Results were: normal Last mammogram: n/a. 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   Carpal tunnel syndrome of right wrist    Chronic back pain    Gallstones    GERD (gastroesophageal reflux disease)    takes Omeprazole daily   History of  migraine    Migraines    Morbid obesity (HCC)    Nausea    takes Phenergan as needed   Osteoarthritis    takes Relafen daily   PIH (pregnancy induced hypertension)    takes Coreg,Enalapril,and Amlodipine daily    Past Surgical History:  Procedure Laterality Date   CARPAL TUNNEL RELEASE Right 08/02/2016   Procedure: CARPAL TUNNEL RELEASE RIGHT;  Surgeon: CabAshok PallD;  [facility] [place]  Laterality: Right;  right   CESAREAN SECTION  2011   CHOLECYSTECTOMY N/A 01/23/2013   Procedure: LAPAROSCOPIC CHOLECYSTECTOMY;  Surgeon: EriGayland CurryD;  [facility] [place];  Laterality: N/A;   LAPAROSCOPIC GASTRIC SLEEVE RESECTION  12/2019   SHOULDER ARTHROSCOPY WITH ROTATOR CUFF REPAIR Right 11/24/2018   Procedure: SHOULDER ARTHROSCOPY WITH ROTATOR CUFF REPAIR subacromial decompression open biceps tenodesis;  Surgeon: NorNetta CedarsD;  [facility];  Service: Orthopedics;  Laterality: Right;  interscalene block   TUBAL LIGATION  2011     Current Outpatient Medications:    amitriptyline (ELAVIL) 25 MG tablet, Take 25 mg by mouth at bedtime., Disp: , Rfl:    carvedilol (COREG) 25 MG tablet, TAKE 1 TABLET BY MOUTH TWICE A DAY WITH A MEAL, Disp: 180 tablet, Rfl: 3   cholecalciferol (VITAMIN D3) 25 MCG (1000 UT) tablet, Take 1,000  Units by mouth daily., Disp: , Rfl:    cyclobenzaprine (FLEXERIL) 10 MG tablet, Take 10 mg by mouth at bedtime., Disp: , Rfl:    DENTA 5000 PLUS 1.1 % CREA dental cream, Place 1 application onto teeth daily. , Disp: , Rfl: 6   famotidine (PEPCID) 20 MG tablet, Take 20 mg by mouth 2 (two) times daily., Disp: , Rfl:    Multiple Vitamin (MULTIVITAMIN) tablet, Take 1 tablet by mouth daily., Disp: , Rfl:    omeprazole (PRILOSEC) 40 MG capsule, Take 40 mg by mouth in the morning and at bedtime., Disp: , Rfl:    oxyCODONE-acetaminophen (PERCOCET) 10-325 MG per tablet, Take 1 tablet by mouth 4 (four) times daily. , Disp: , Rfl: 0    pregabalin (LYRICA) 150 MG capsule, Take 150 mg by mouth 2 (two) times daily., Disp: , Rfl:    topiramate (TOPAMAX) 200 MG tablet, Take 200 mg by mouth 2 (two) times daily., Disp: , Rfl:    triamcinolone cream (KENALOG) 0.5 %, Apply 1 Application topically as needed (Ezcema)., Disp: , Rfl:    acetaminophen (TYLENOL) 500 MG tablet, Take 1,000 mg by mouth every 6 (six) hours as needed for moderate pain. (Patient not taking: Reported on 03/20/2022), Disp: , Rfl:    ferrous sulfate 325 (65 FE) MG EC tablet, Take 325 mg by mouth daily.  (Patient not taking: Reported on 03/20/2022), Disp: , Rfl: 2   metroNIDAZOLE (FLAGYL) 500 MG tablet, Take 1 tablet (500 mg total) by mouth 2 (two) times daily. (Patient not taking: Reported on 03/20/2022), Disp: 14 tablet, Rfl: 2   naloxone (NARCAN) nasal spray 4 mg/0.1 mL, Place into the nose. (Patient not taking: Reported on 02/20/2022), Disp: , Rfl:    senna-docusate (SENOKOT-S) 8.6-50 MG tablet, Take 1 tablet by mouth at bedtime as needed. (Patient not taking: Reported on 03/20/2022), Disp: , Rfl:    SUMAtriptan (IMITREX) 50 MG tablet, Take 50 mg by mouth daily as needed for migraine. May repeat in 2 hours if headache persists or recurs. (Patient not taking: Reported on 02/20/2022), Disp: , Rfl:    vitamin B-12 (CYANOCOBALAMIN) 1000 MCG tablet, Take 1,000 mcg by mouth daily. (Patient not taking: Reported on 03/20/2022), Disp: , Rfl:  Allergies  Allergen Reactions   Cymbalta [Duloxetine Hcl] Other (See Comments)    Paranoia/nauseous/anxious feeling.   Reglan [Metoclopramide] Anxiety    Social History   Tobacco Use   Smoking status: Never    Passive exposure: Never   Smokeless tobacco: Never  Substance Use Topics   Alcohol use: No    Alcohol/week: 0.0 standard drinks of alcohol    Family History  Problem Relation Age of Onset   Hypertension Mother    Diabetes Mother    Heart disease Mother    Miscarriages / [place] Mother    COPD Mother    Arthritis Mother     Alcohol abuse Father    Asthma Father    Diabetes Maternal Grandmother    Heart disease Maternal Grandmother    Cancer Maternal Grandfather        leukemia   Arthritis Maternal Grandfather    Cancer Paternal Grandmother        breast   Cancer Paternal Grandfather        leukemia      Review of Systems  Constitutional: negative for fatigue and weight loss Respiratory: negative for cough and wheezing Cardiovascular: negative for chest pain, fatigue and palpitations Gastrointestinal: negative for abdominal pain and change  in bowel habits Musculoskeletal:negative for myalgias Neurological: negative for gait problems and tremors Behavioral/Psych: negative for abusive relationship, depression Endocrine: negative for temperature intolerance    Genitourinary: positive for vaginal discharge and heavy periods with clots.  negative for genital lesions, hot flashes, sexual problems  Integument/breast: negative for breast lump, breast tenderness, nipple discharge and skin lesion(s)    Objective:       BP 107/72   Pulse 92   Ht '5\' 5"'$  (1.651 m)   Wt 255 lb 11.2 oz (116 kg)   LMP 02/27/2022   BMI 42.55 kg/m  General:   Alert and no distress  Skin:   no rash or abnormalities  Lungs:   clear to auscultation bilaterally  Heart:   regular rate and rhythm, S1, S2 normal, no murmur, click, rub or gallop  Breasts:   normal without suspicious masses, skin or nipple changes or axillary nodes  Abdomen:  normal findings: no organomegaly, soft, non-tender and no hernia  Pelvis:  External genitalia: normal general appearance Urinary system: urethral meatus normal and bladder without fullness, nontender Vaginal: normal without tenderness, induration or masses Cervix: normal appearance Adnexa: normal bimanual exam Uterus: anteverted and non-tender, normal size   Lab Review Urine pregnancy test Labs reviewed yes Radiologic studies reviewed no  I have spent a total of 20 minutes of  face-to-face time, excluding clinical staff time, reviewing notes and preparing to see patient, ordering tests and/or medications, and counseling the patient.   Assessment:    1. Encounter for gynecological examination with Papanicolaou smear of cervix Rx: - Cytology - PAP( [place])  2. Abnormal uterine bleeding (AUB) - considering Endometrial Ablation  3. Vaginal discharge Rx: - Cervicovaginal ancillary only( [place])  4. Screening for STD (sexually transmitted disease) Rx: - Hepatitis B surface antigen - Hepatitis C antibody - HIV Antibody (routine testing w rflx) - RPR  5. Screening breast examination Rx: - MM Digital Screening; Future  6. Class 3 severe obesity without serious comorbidity with body mass index (BMI) of 40.0 to 44.9 in adult, unspecified obesity type [facility])    Plan:    Education reviewed: calcium supplements, depression evaluation, low fat, low cholesterol diet, safe sex/STD prevention, self breast exams, and weight bearing exercise. Mammogram ordered. Follow up in: 3 months.    Orders Placed This Encounter  Procedures   MM Digital Screening    Standing Status:   Future    Standing Expiration Date:   03/20/2023    Order Specific Question:   Reason for Exam (SYMPTOM  OR DIAGNOSIS REQUIRED)    Answer:   mammogram screening    Order Specific Question:   Is the patient pregnant?    Answer:   No    Order Specific Question:   Preferred imaging location?    Answer:   [facility]   Hepatitis B surface antigen   Hepatitis C antibody   HIV Antibody (routine testing w rflx)   RPR    Shelly Bombard, MD 03/20/2022 9:41 AM

## 2022-03-21 LAB — CERVICOVAGINAL ANCILLARY ONLY
Bacterial Vaginitis (gardnerella): NEGATIVE
Candida Glabrata: NEGATIVE
Candida Vaginitis: NEGATIVE
Chlamydia: NEGATIVE
Comment: NEGATIVE
Comment: NEGATIVE
Comment: NEGATIVE
Comment: NEGATIVE
Comment: NEGATIVE
Comment: NORMAL
Neisseria Gonorrhea: NEGATIVE
Trichomonas: NEGATIVE

## 2022-03-21 LAB — RPR: RPR Ser Ql: NONREACTIVE

## 2022-03-21 LAB — HEPATITIS C ANTIBODY: Hep C Virus Ab: NONREACTIVE

## 2022-03-21 LAB — HEPATITIS B SURFACE ANTIGEN: Hepatitis B Surface Ag: NEGATIVE

## 2022-03-21 LAB — HIV ANTIBODY (ROUTINE TESTING W REFLEX): HIV Screen 4th Generation wRfx: NONREACTIVE

## 2022-03-22 LAB — CYTOLOGY - PAP
Adequacy: ABSENT
Comment: NEGATIVE
Diagnosis: NEGATIVE
High risk HPV: NEGATIVE

## 2022-03-23 ENCOUNTER — Ambulatory Visit (HOSPITAL_BASED_OUTPATIENT_CLINIC_OR_DEPARTMENT_OTHER): Payer: Medicaid Other | Admitting: Radiology

## 2022-03-28 ENCOUNTER — Ambulatory Visit (HOSPITAL_BASED_OUTPATIENT_CLINIC_OR_DEPARTMENT_OTHER)
Admission: RE | Admit: 2022-03-28 | Discharge: 2022-03-28 | Disposition: A | Discharge status: Home / Self Care | Payer: Medicaid Other | Source: Ambulatory Visit | Attending: Obstetrics | Admitting: Obstetrics

## 2022-03-28 ENCOUNTER — Encounter (HOSPITAL_BASED_OUTPATIENT_CLINIC_OR_DEPARTMENT_OTHER): Payer: Self-pay | Admitting: Radiology

## 2022-03-28 DIAGNOSIS — Z1239 Encounter for other screening for malignant neoplasm of breast: Secondary | ICD-10-CM | POA: Diagnosis present

## 2022-03-28 DIAGNOSIS — Z1231 Encounter for screening mammogram for malignant neoplasm of breast: Secondary | ICD-10-CM | POA: Insufficient documentation

## 2023-02-25 ENCOUNTER — Other Ambulatory Visit (HOSPITAL_BASED_OUTPATIENT_CLINIC_OR_DEPARTMENT_OTHER): Payer: Self-pay | Admitting: Internal Medicine

## 2023-02-25 DIAGNOSIS — Z1231 Encounter for screening mammogram for malignant neoplasm of breast: Secondary | ICD-10-CM

## 2023-03-28 NOTE — Progress Notes (Deleted)
  Cardiology Office Note:   Date:  03/28/2023  ID:  PURVA VESSELL, DOB Mar 28, 1980, MRN [id number] PCP: Jackie Plum, MD  [facility] Providers Cardiologist:  Rollene Rotunda, MD {  History of Present Illness:   Allison [name] is a 43 y.o. female who presents for followup of nonischemic cardiomyopathy with an ejection fraction as low as 25% followup echo in 2012 and echo in Dec 2014 demonstrated it was up to 60%.  She had bariatric surgery.  She is lost a total of about 120 pounds.    Since I last saw her ***   ***  she has done well.  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  The patient denies any new symptoms such as chest discomfort, neck or arm discomfort. There has been no new shortness of breath, PND or orthopnea. There have been no reported palpitations, presyncope or syncope.   ROS: ***  Studies Reviewed:    EKG:       ***  Risk Assessment/Calculations:   {Does this patient have ATRIAL FIBRILLATION?:[id number]} No BP recorded.  {Refresh Note OR Click here to enter BP  :1}***        Physical Exam:   VS:  There were no vitals taken for this visit.   Wt Readings from Last 3 Encounters:  03/20/22 255 lb 11.2 oz (116 kg)  02/20/22 258 lb (117 kg)  02/09/21 255 lb (115.7 kg)     GEN: Well nourished, well developed in no acute distress NECK: No JVD; No carotid bruits CARDIAC: ***RR, *** murmurs, rubs, gallops RESPIRATORY:  Clear to auscultation without rales, wheezing or rhonchi  ABDOMEN: Soft, non-tender, non-distended EXTREMITIES:  No edema; No deformity   ASSESSMENT AND PLAN:   CARDIOMYOPATHY:    Her EF was improved in 2018.  ***   I would not suspect this to be changed clinically.  No further imaging.   ESSENTIAL HYPERTENSION:   Her blood pressure is ***  runs low.  I had to back off on medications because of this.  She has not tolerated med titration which she had excellent response with ejection fraction.      OBESITY, MORBID    ***  she has lost a total of 120 pounds.  No change in therapy     Follow up ***  Signed, Rollene Rotunda, MD

## 2023-03-29 ENCOUNTER — Ambulatory Visit (HOSPITAL_BASED_OUTPATIENT_CLINIC_OR_DEPARTMENT_OTHER): Payer: Medicaid Other | Admitting: Radiology

## 2023-03-29 ENCOUNTER — Ambulatory Visit: Payer: Medicaid Other | Attending: Cardiology | Admitting: Cardiology

## 2023-03-29 DIAGNOSIS — I10 Essential (primary) hypertension: Secondary | ICD-10-CM

## 2023-03-29 DIAGNOSIS — I420 Dilated cardiomyopathy: Secondary | ICD-10-CM

## 2023-04-01 ENCOUNTER — Encounter: Payer: Self-pay | Admitting: Cardiology

## 2023-04-05 ENCOUNTER — Ambulatory Visit (HOSPITAL_BASED_OUTPATIENT_CLINIC_OR_DEPARTMENT_OTHER): Admission: RE | Admit: 2023-04-05 | Source: Ambulatory Visit | Admitting: Radiology

## 2023-04-09 ENCOUNTER — Ambulatory Visit (HOSPITAL_BASED_OUTPATIENT_CLINIC_OR_DEPARTMENT_OTHER)
Admission: RE | Admit: 2023-04-09 | Discharge: 2023-04-09 | Disposition: A | Discharge status: Home / Self Care | Source: Ambulatory Visit | Attending: Internal Medicine | Admitting: Internal Medicine

## 2023-04-09 ENCOUNTER — Encounter (HOSPITAL_BASED_OUTPATIENT_CLINIC_OR_DEPARTMENT_OTHER): Payer: Self-pay | Admitting: Radiology

## 2023-04-09 DIAGNOSIS — Z1231 Encounter for screening mammogram for malignant neoplasm of breast: Secondary | ICD-10-CM | POA: Insufficient documentation

## 2023-05-03 ENCOUNTER — Encounter: Payer: Self-pay | Admitting: Obstetrics

## 2023-05-03 ENCOUNTER — Other Ambulatory Visit (HOSPITAL_COMMUNITY)
Admission: RE | Admit: 2023-05-03 | Discharge: 2023-05-03 | Disposition: A | Discharge status: Home / Self Care | Source: Ambulatory Visit | Attending: Obstetrics | Admitting: Obstetrics

## 2023-05-03 ENCOUNTER — Ambulatory Visit: Admitting: Obstetrics

## 2023-05-03 VITALS — BP 131/80 | HR 96 | Ht 65.0 in | Wt 244.5 lb

## 2023-05-03 DIAGNOSIS — Z113 Encounter for screening for infections with a predominantly sexual mode of transmission: Secondary | ICD-10-CM

## 2023-05-03 DIAGNOSIS — D500 Iron deficiency anemia secondary to blood loss (chronic): Secondary | ICD-10-CM

## 2023-05-03 DIAGNOSIS — N939 Abnormal uterine and vaginal bleeding, unspecified: Secondary | ICD-10-CM | POA: Diagnosis not present

## 2023-05-03 DIAGNOSIS — J301 Allergic rhinitis due to pollen: Secondary | ICD-10-CM

## 2023-05-03 DIAGNOSIS — Z86711 Personal history of pulmonary embolism: Secondary | ICD-10-CM

## 2023-05-03 DIAGNOSIS — Z1339 Encounter for screening examination for other mental health and behavioral disorders: Secondary | ICD-10-CM

## 2023-05-03 DIAGNOSIS — Z01419 Encounter for gynecological examination (general) (routine) without abnormal findings: Secondary | ICD-10-CM

## 2023-05-03 DIAGNOSIS — E66813 Obesity, class 3: Secondary | ICD-10-CM

## 2023-05-03 DIAGNOSIS — N946 Dysmenorrhea, unspecified: Secondary | ICD-10-CM

## 2023-05-03 DIAGNOSIS — N898 Other specified noninflammatory disorders of vagina: Secondary | ICD-10-CM

## 2023-05-03 DIAGNOSIS — I5032 Chronic diastolic (congestive) heart failure: Secondary | ICD-10-CM

## 2023-05-03 DIAGNOSIS — Z6841 Body Mass Index (BMI) 40.0 and over, adult: Secondary | ICD-10-CM

## 2023-05-03 MED ORDER — IBUPROFEN 800 MG PO TABS: 800.0000 mg | ORAL_TABLET | Freq: Three times a day (TID) | ORAL | 5 refills | Status: AC | PRN

## 2023-05-03 MED ORDER — LORATADINE 10 MG PO TABS: 10.0000 mg | ORAL_TABLET | Freq: Every day | ORAL | 11 refills | Status: AC

## 2023-05-03 NOTE — Progress Notes (Addendum)
 Subjective:        Allison [name] is a 43 y.o. female here for a routine exam.  Current complaints: Heavy periods.  Has a history of anemia, and has received iron infusions.  Discussed Endometrial Ablation on past visit, and she expressed interest in having it done.  Personal health questionnaire:  Is patient [name], have a family history of breast and/or ovarian cancer: yes Is there a family history of uterine cancer diagnosed at age < 16, gastrointestinal cancer, urinary tract cancer, family member who is a Personnel officer syndrome-associated carrier: no Is the patient overweight and hypertensive, family history of diabetes, personal history of gestational diabetes, preeclampsia or PCOS: yes Is patient over 3, have PCOS,  family history of premature CHD under age 51, diabetes, smoke, have hypertension or peripheral artery disease:  no At any time, has a partner hit, kicked or otherwise hurt or frightened you?: no Over the past 2 weeks, have you felt down, depressed or hopeless?: no Over the past 2 weeks, have you felt little interest or pleasure in doing things?:no   Gynecologic History Patient's last menstrual period was 04/18/2023 (exact date). Contraception: tubal ligation Last Pap: 03-20-2022. Results were: normal Last mammogram: 04-09-2023. 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/08/2009   Carpal tunnel syndrome of right wrist    Chronic back pain    Gallstones     GERD (gastroesophageal reflux disease)    takes Omeprazole daily   History of migraine    Migraines    Morbid obesity (HCC)    Nausea    takes Phenergan  as needed   Osteoarthritis    takes Relafen  daily   PIH (pregnancy induced hypertension)    takes Coreg ,Enalapril ,and Amlodipine  daily    Past Surgical History:  Procedure Laterality Date   CARPAL TUNNEL RELEASE Right 08/02/2016   Procedure: CARPAL TUNNEL RELEASE RIGHT;  Surgeon: Audie Bleacher, MD;  [facility];  Service: Neurosurgery;  Laterality: Right;  right   CESAREAN SECTION  2011   CHOLECYSTECTOMY N/A 01/23/2013   Procedure: LAPAROSCOPIC CHOLECYSTECTOMY;  Surgeon: Fran Imus, MD;  [facility];  Service: General;  Laterality: N/A;   LAPAROSCOPIC GASTRIC SLEEVE RESECTION  12/2019   SHOULDER ARTHROSCOPY WITH ROTATOR CUFF REPAIR Right 11/24/2018   Procedure: SHOULDER ARTHROSCOPY WITH ROTATOR CUFF REPAIR subacromial decompression open biceps tenodesis;  Surgeon: Winston Hawking, MD;  [facility];  Service: Orthopedics;  Laterality: Right;  interscalene block   TUBAL LIGATION  2011     Current Outpatient Medications:    amitriptyline (ELAVIL) 25 MG tablet, Take 25 mg by mouth at bedtime., Disp: , Rfl:    carvedilol  (COREG ) 25 MG tablet, TAKE 1 TABLET BY MOUTH TWICE A DAY WITH A MEAL, Disp: 180 tablet, Rfl: 3   cholecalciferol (VITAMIN  D3) 25 MCG (1000 UT) tablet, Take 1,000 Units by mouth daily., Disp: , Rfl:    cyclobenzaprine  (FLEXERIL ) 10 MG tablet, Take 10 mg by mouth at bedtime., Disp: , Rfl:    famotidine (PEPCID) 20 MG tablet, Take 20 mg by mouth 2 (two) times daily., Disp: , Rfl:    ibuprofen (ADVIL) 800 MG tablet, Take 1 tablet (800 mg total) by mouth every 8 (eight) hours as needed., Disp: 30 tablet, Rfl: 5   loratadine (CLARITIN) 10 MG tablet, Take 1 tablet (10 mg total) by mouth daily., Disp: 30 tablet, Rfl: 11   Multiple Vitamin (MULTIVITAMIN) tablet, Take 1 tablet by mouth daily., Disp: , Rfl:     omeprazole (PRILOSEC) 40 MG capsule, Take 40 mg by mouth in the morning and at bedtime., Disp: , Rfl:    oxyCODONE -acetaminophen  (PERCOCET) 10-325 MG per tablet, Take 1 tablet by mouth 4 (four) times daily. , Disp: , Rfl: 0   pregabalin (LYRICA) 150 MG capsule, Take 150 mg by mouth 2 (two) times daily., Disp: , Rfl:    topiramate  (TOPAMAX ) 200 MG tablet, Take 200 mg by mouth 2 (two) times daily., Disp: , Rfl:    triamcinolone cream (KENALOG) 0.5 %, Apply 1 Application topically as needed (Ezcema)., Disp: , Rfl:    acetaminophen  (TYLENOL ) 500 MG tablet, Take 1,000 mg by mouth every 6 (six) hours as needed for moderate pain. (Patient not taking: Reported on 03/20/2022), Disp: , Rfl:    DENTA 5000 PLUS 1.1 % CREA dental cream, Place 1 application onto teeth daily.  (Patient not taking: Reported on 05/03/2023), Disp: , Rfl: 6   ferrous sulfate 325 (65 FE) MG EC tablet, Take 325 mg by mouth daily.  (Patient not taking: Reported on 03/20/2022), Disp: , Rfl: 2   metroNIDAZOLE  (FLAGYL ) 500 MG tablet, Take 1 tablet (500 mg total) by mouth 2 (two) times daily. (Patient not taking: Reported on 03/20/2022), Disp: 14 tablet, Rfl: 2   naloxone (NARCAN) nasal spray 4 mg/0.1 mL, Place into the nose. (Patient not taking: Reported on 05/03/2023), Disp: , Rfl:    SUMAtriptan (IMITREX) 50 MG tablet, Take 50 mg by mouth daily as needed for migraine. May repeat in 2 hours if headache persists or recurs. (Patient not taking: Reported on 02/20/2022), Disp: , Rfl:    vitamin B-12 (CYANOCOBALAMIN) 1000 MCG tablet, Take 1,000 mcg by mouth daily. (Patient not taking: Reported on 03/20/2022), Disp: , Rfl:  Allergies  Allergen Reactions   Cymbalta [Duloxetine Hcl] Other (See Comments)    Paranoia/nauseous/anxious feeling.   Reglan [Metoclopramide] Anxiety    Social History   Tobacco Use   Smoking status: Never    Passive exposure: Never   Smokeless tobacco: Never  Substance Use Topics   Alcohol use: No    Alcohol/week: 0.0  standard drinks of alcohol    Family History  Problem Relation Age of Onset   Hypertension Mother    Diabetes Mother    Heart disease Mother    Miscarriages / [place] Mother    COPD Mother    Arthritis Mother    Alcohol abuse Father    Asthma Father    Diabetes Maternal Grandmother    Heart disease Maternal Grandmother    Cancer Maternal Grandfather        leukemia   Arthritis Maternal Grandfather    Cancer Paternal Grandmother 47 - 64       breast   Cancer Paternal Grandfather        leukemia  Review of Systems  Constitutional: negative for fatigue and weight loss Respiratory: negative for cough and wheezing Cardiovascular: negative for chest pain, fatigue and palpitations Gastrointestinal: negative for abdominal pain and change in bowel habits Musculoskeletal:negative for myalgias Neurological: negative for gait problems and tremors Behavioral/Psych: negative for abusive relationship, depression Endocrine: negative for temperature intolerance    Genitourinary: positive for vaginal discharge.  negative for abnormal menstrual periods, genital lesions, hot flashes, sexual problems  Integument/breast: negative for breast lump, breast tenderness, nipple discharge and skin lesion(s)    Objective:       BP 131/80   Pulse 96   Ht 5\' 5"  (1.651 m)   Wt 244 lb 8 oz (110.9 kg)   LMP 04/18/2023 (Exact Date)   BMI 40.69 kg/m  General:   Alert and no distress  Skin:   no rash or abnormalities  Lungs:   clear to auscultation bilaterally  Heart:   regular rate and rhythm, S1, S2 normal, no murmur, click, rub or gallop  Breasts:   normal without suspicious masses, skin or nipple changes or axillary nodes  Abdomen:  normal findings: no organomegaly, soft, non-tender and no hernia  Pelvis:  External genitalia: normal general appearance Urinary system: urethral meatus normal and bladder without fullness, nontender Vaginal: normal without tenderness, induration or  masses Cervix: normal appearance Adnexa: normal bimanual exam Uterus: anteverted and non-tender, normal size   Lab Review Urine pregnancy test Labs reviewed yes Radiologic studies reviewed yes  I have spent a total of 20 minutes of face-to-face time, excluding clinical staff time, reviewing notes and preparing to see patient, ordering tests and/or medications, and counseling the patient.   Assessment:      1. Encounter for gynecological examination with Papanicolaou smear of cervix (Primary) Rx: - Cytology - PAP( [place])  2. Dysmenorrhea Rx: - ibuprofen (ADVIL) 800 MG tablet; Take 1 tablet (800 mg total) by mouth every 8 (eight) hours as needed.  Dispense: 30 tablet; Refill: 5  3. Abnormal uterine bleeding (AUB) - patient considering Endometrial Ablation.  Will need Endometrial Biopsy first.  4. Iron deficiency anemia due to chronic blood loss - has received iron infusion   Check CBC and Ferritin.  5. History of pulmonary embolism  6. Vaginal discharge - Cervicovaginal ancillary only( [place]  7. Screening for STD (sexually transmitted disease) Rx: - RPR - HIV antibody (with reflex) - Hepatitis C Antibody - Hepatitis B Surface AntiGEN  8. Chronic diastolic CHF (congestive heart failure) (HCC) - clinically stable.  Must have Cardiology clearance before surgery.  9. Class 3 severe obesity without serious comorbidity with body mass index (BMI) of 40.0 to 44.9 in adult, unspecified obesity type (HCC) - weight reduction recommended with the aid of dietary changes, exercise and behavioral modification  10. Seasonal allergic rhinitis due to pollen Rx: - loratadine (CLARITIN) 10 MG tablet; Take 1 tablet (10 mg total) by mouth daily.  Dispense: 30 tablet; Refill: 11     Plan:    Education reviewed: calcium supplements, depression evaluation, low fat, low cholesterol diet, safe sex/STD prevention, self breast exams, and weight bearing exercise. Follow up in: 4  weeks.   Meds ordered this encounter  Medications   loratadine (CLARITIN) 10 MG tablet    Sig: Take 1 tablet (10 mg total) by mouth daily.    Dispense:  30 tablet    Refill:  11   ibuprofen (ADVIL) 800 MG tablet    Sig: Take 1 tablet (800 mg total) by  mouth every 8 (eight) hours as needed.    Dispense:  30 tablet    Refill:  5   Orders Placed This Encounter  Procedures   RPR   HIV antibody (with reflex)   Hepatitis C Antibody   Hepatitis B Surface AntiGEN     Gabrielle Joiner, MD, FACOG Attending Obstetrician & Gynecologist, [facility], [facility], [place] 05/03/2023

## 2023-05-03 NOTE — Progress Notes (Signed)
 Pt presents for annual. Pt wants all std testing and gets a yearly pap. Pt has no questions or concerns at this time.

## 2023-05-04 LAB — HEPATITIS B SURFACE ANTIGEN: Hepatitis B Surface Ag: NEGATIVE

## 2023-05-04 LAB — RPR: RPR Ser Ql: NONREACTIVE

## 2023-05-04 LAB — HIV ANTIBODY (ROUTINE TESTING W REFLEX): HIV Screen 4th Generation wRfx: NONREACTIVE

## 2023-05-04 LAB — HEPATITIS C ANTIBODY: Hep C Virus Ab: NONREACTIVE

## 2023-05-06 ENCOUNTER — Other Ambulatory Visit: Payer: Self-pay | Admitting: Obstetrics

## 2023-05-06 DIAGNOSIS — B9689 Other specified bacterial agents as the cause of diseases classified elsewhere: Secondary | ICD-10-CM

## 2023-05-06 DIAGNOSIS — B3731 Acute candidiasis of vulva and vagina: Secondary | ICD-10-CM

## 2023-05-06 LAB — CERVICOVAGINAL ANCILLARY ONLY
Bacterial Vaginitis (gardnerella): NEGATIVE
Candida Glabrata: NEGATIVE
Candida Vaginitis: POSITIVE — AB
Chlamydia: NEGATIVE
Comment: NEGATIVE
Comment: NEGATIVE
Comment: NEGATIVE
Comment: NEGATIVE
Comment: NEGATIVE
Comment: NORMAL
Neisseria Gonorrhea: NEGATIVE
Trichomonas: POSITIVE — AB

## 2023-05-06 MED ORDER — FLUCONAZOLE 150 MG PO TABS
150.0000 mg | ORAL_TABLET | Freq: Once | ORAL | 0 refills | Status: AC
Start: 2023-05-06 — End: 2023-05-06

## 2023-05-06 MED ORDER — METRONIDAZOLE 500 MG PO TABS: 500.0000 mg | ORAL_TABLET | Freq: Two times a day (BID) | ORAL | 2 refills | Status: DC

## 2023-05-07 LAB — CYTOLOGY - PAP
Comment: NEGATIVE
Diagnosis: NEGATIVE
High risk HPV: NEGATIVE

## 2023-05-21 ENCOUNTER — Ambulatory Visit: Admitting: Obstetrics and Gynecology

## 2023-05-21 NOTE — Progress Notes (Deleted)
    GYNECOLOGY VISIT  Patient name: Allison [name] MRN [id number]  Date of birth: Dec 23, 1980 Chief Complaint:   No chief complaint on file.   History:  Allison [name] is a 43 y.o. [phone] being seen today for ***.   Ssen by [facility] 05/03/23: hx of heavy periods and intersted in endometrial ablation. 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   Carpal tunnel syndrome of right wrist    Chronic back pain    Gallstones    GERD (gastroesophageal reflux disease)    takes Omeprazole daily   History of migraine    Migraines    Morbid obesity (HCC)    Nausea    takes Phenergan  as needed   Osteoarthritis    takes Relafen  daily   PIH (pregnancy induced hypertension)    takes Coreg ,Enalapril ,and Amlodipine  daily    Past Surgical History:  Procedure Laterality Date   CARPAL TUNNEL RELEASE Right 08/02/2016   Procedure: CARPAL TUNNEL RELEASE RIGHT;  Surgeon: Audie Bleacher, MD;  Location: [facility] OR;  Service: Neurosurgery;  Laterality: Right;  right   CESAREAN SECTION  2011   CHOLECYSTECTOMY N/A 01/23/2013   Procedure: LAPAROSCOPIC CHOLECYSTECTOMY;  Surgeon: Fran Imus, MD;  Location: [facility] OR;  Service: General;  Laterality: N/A;   LAPAROSCOPIC GASTRIC SLEEVE RESECTION  12/2019   SHOULDER ARTHROSCOPY WITH ROTATOR CUFF REPAIR Right 11/24/2018   Procedure: SHOULDER ARTHROSCOPY WITH ROTATOR CUFF REPAIR subacromial decompression open biceps tenodesis;  Surgeon: Winston Hawking, MD;  Location: [facility] ORS;  Service: Orthopedics;  Laterality: Right;  interscalene block   TUBAL LIGATION  2011    The following portions of the patient's history were reviewed and updated as appropriate: allergies, current medications, past family history, past medical history, past social history, past surgical history and problem list.   Health Maintenance:   Last pap     Component Value Date/Time   DIAGPAP  05/03/2023 0930     - Negative for intraepithelial lesion or malignancy (NILM)   DIAGPAP  03/20/2022 0838    - Negative for intraepithelial lesion or malignancy (NILM)   DIAGPAP  02/09/2021 1656    - Negative for intraepithelial lesion or malignancy (NILM)   HPVHIGH Negative 05/03/2023 0930   HPVHIGH Negative 03/20/2022 0838   HPVHIGH Negative 02/08/2020 1427   ADEQPAP  05/03/2023 0930    Satisfactory for evaluation; transformation zone component PRESENT.   ADEQPAP  03/20/2022 0838    Satisfactory for evaluation; transformation zone component ABSENT.   ADEQPAP  02/09/2021 1656    Satisfactory for evaluation; transformation zone component PRESENT.    High Risk HPV: Positive  Adequacy:  Satisfactory for evaluation, transformation zone component PRESENT  Diagnosis:  Atypical squamous cells of undetermined significance (ASC-US )  Last mammogram: ***. Results were: {normal, abnormal, n/a:23837}. Family h/o breast cancer: {yes***/no:23838}   Review of Systems:  {Ros - complete:30496} Comprehensive review of systems was otherwise negative.   Objective:  Physical Exam LMP 04/18/2023 (Exact Date)    Physical Exam   Labs and Imaging No results found.     Assessment & Plan:   There are no diagnoses linked to this encounter.   *** Routine preventative health maintenance measures emphasized.  Kiki Pelton, MD [facility], [facility]

## 2023-06-05 NOTE — Progress Notes (Deleted)
  Cardiology Office Note:   Date:  06/05/2023  ID:  Allison [name], DOB 11-17-1980, MRN [id number] PCP: Tretha Fu, MD  [facility] Providers Cardiologist:  Eilleen Grates, MD {  History of Present Illness:   Allison [name] is a 43 y.o. female who presents for followup of nonischemic cardiomyopathy with an ejection fraction as low as 25% followup echo in 2012 and echo in Dec 2014 demonstrated it was up to 60%.  She had bariatric surgery.  She is lost a total of about 120 pounds.    Since I last saw her ***     ***   she has done well.  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  The patient denies any new symptoms such as chest discomfort, neck or arm discomfort. There has been no new shortness of breath, PND or orthopnea. There have been no reported palpitations, presyncope or syncope.     ROS: ***  Studies Reviewed:    EKG:       ***  Risk Assessment/Calculations:   {Does this patient have ATRIAL FIBRILLATION?:[phone]} No BP recorded.  {Refresh Note OR Click here to enter BP  :1}***        Physical Exam:   VS:  There were no vitals taken for this visit.   Wt Readings from Last 3 Encounters:  05/03/23 244 lb 8 oz (110.9 kg)  03/20/22 255 lb 11.2 oz (116 kg)  02/20/22 258 lb (117 kg)     GEN: Well nourished, well developed in no acute distress NECK: No JVD; No carotid bruits CARDIAC: ***RR, *** murmurs, rubs, gallops RESPIRATORY:  Clear to auscultation without rales, wheezing or rhonchi  ABDOMEN: Soft, non-tender, non-distended EXTREMITIES:  No edema; No deformity   ASSESSMENT AND PLAN:   CARDIOMYOPATHY:    Her EF was improved in 2018.  ***  I would not suspect this to be changed clinically.  No further imaging.   ESSENTIAL HYPERTENSION:   Her blood pressure ***  runs low.  I had to back off on medications because of this.  She has not tolerated med titration which she had excellent response with ejection fraction.      OBESITY, MORBID    ***  she has lost a total of 120 pounds.  No change in therapy     Follow up ***  Signed, Eilleen Grates, MD

## 2023-06-06 ENCOUNTER — Ambulatory Visit: Attending: Cardiology | Admitting: Cardiology

## 2023-06-06 DIAGNOSIS — I10 Essential (primary) hypertension: Secondary | ICD-10-CM

## 2023-06-06 DIAGNOSIS — I420 Dilated cardiomyopathy: Secondary | ICD-10-CM

## 2023-06-18 ENCOUNTER — Telehealth: Payer: Self-pay | Admitting: Licensed Clinical Social Worker

## 2023-06-18 NOTE — Telephone Encounter (Signed)
 [facility] contacted patient on this date to provide [facility] intro and to schedule [facility] appointment. [facility] left VM.

## 2023-07-26 ENCOUNTER — Ambulatory Visit: Admitting: Cardiology

## 2023-09-05 NOTE — Progress Notes (Deleted)
  Cardiology Office Note:   Date:  09/05/2023  ID:  Allison [name], DOB 05-Dec-1980, MRN [id number] PCP: Catalina Bare, MD  [facility] Providers Cardiologist:  Lynwood Schilling, MD {  History of Present Illness:   Allison [name] is a 43 y.o. female who presents for followup of nonischemic cardiomyopathy with an ejection fraction as low as 25% followup echo in 2012 and echo in Dec 2014 demonstrated it was up to 60%.  She had bariatric surgery.  She is lost a total of about 120 pounds.    Since I last saw her she has done well.  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  The patient denies any new symptoms such as chest discomfort, neck or arm discomfort. There has been no new shortness of breath, PND or orthopnea. There have been no reported palpitations, presyncope or syncope.       ROS: ***  Studies Reviewed:    EKG:       ***  Risk Assessment/Calculations:   {Does this patient have ATRIAL FIBRILLATION?:[id number]} No BP recorded.  {Refresh Note OR Click here to enter BP  :1}***        Physical Exam:   VS:  There were no vitals taken for this visit.   Wt Readings from Last 3 Encounters:  05/03/23 244 lb 8 oz (110.9 kg)  03/20/22 255 lb 11.2 oz (116 kg)  02/20/22 258 lb (117 kg)     GEN: Well nourished, well developed in no acute distress NECK: No JVD; No carotid bruits CARDIAC: ***RR, *** murmurs, rubs, gallops RESPIRATORY:  Clear to auscultation without rales, wheezing or rhonchi  ABDOMEN: Soft, non-tender, non-distended EXTREMITIES:  No edema; No deformity   ASSESSMENT AND PLAN:   CARDIOMYOPATHY:    Her EF was improved in 2018. ***  I would not suspect this to be changed clinically.  No further imaging.   ESSENTIAL HYPERTENSION:   Her blood pressure *** runs low.  I had to back off on medications because of this.  She has not tolerated med titration which she had excellent response with ejection fraction.     OBESITY, MORBID     ***  she has lost a total of 120 pounds.  No change in therapy     Follow up ***  Signed, Lynwood Schilling, MD

## 2023-09-06 ENCOUNTER — Ambulatory Visit: Admitting: Cardiology

## 2023-09-06 DIAGNOSIS — I420 Dilated cardiomyopathy: Secondary | ICD-10-CM

## 2023-09-06 DIAGNOSIS — I10 Essential (primary) hypertension: Secondary | ICD-10-CM

## 2023-10-06 NOTE — Progress Notes (Unsigned)
  Cardiology Office Note:   Date:  10/07/2023  ID:  Allison [name], DOB 1980/01/16, MRN [id number] PCP: Catalina Bare, MD   [facility] Providers Cardiologist:  Lynwood Schilling, MD {  History of Present Illness:   Allison [name] is a 43 y.o. female who presents for followup of nonischemic cardiomyopathy with an ejection fraction as low as 25% followup echo in 2012 and echo in Dec 2014 demonstrated it was up to 60%.  She had bariatric surgery.  She is lost a total of about 120 pounds.    Since I last saw her she has had no new cardiovascular complaints.  The patient denies any new symptoms such as chest discomfort, neck or arm discomfort. There has been no new shortness of breath, PND or orthopnea. There have been no reported palpitations, presyncope or syncope.  She does Pilates at home.    ROS: As stated in the HPI and negative for all other systems.  Studies Reviewed:    EKG:   EKG Interpretation Date/Time:  Monday October 07 2023 12:34:59 EDT Ventricular Rate:  90 PR Interval:  162 QRS Duration:  80 QT Interval:  346 QTC Calculation: 423 R Axis:   0  Text Interpretation: Normal sinus rhythm Poor anterior R wave progression When compared with ECG of 30-Oct-2016 23:13, No significant change since last tracing Confirmed by Schilling Lynwood ([id number]) on 10/07/2023 12:47:15 PM    Risk Assessment/Calculations:              Physical Exam:   VS:  BP 125/83   Pulse 90   Ht 5' 5 (1.651 m)   Wt 237 lb (107.5 kg)   SpO2 100%   BMI 39.44 kg/m    Wt Readings from Last 3 Encounters:  10/07/23 237 lb (107.5 kg)  05/03/23 244 lb 8 oz (110.9 kg)  03/20/22 255 lb 11.2 oz (116 kg)     GEN: Well nourished, well developed in no acute distress NECK: No JVD; No carotid bruits CARDIAC: RRR, no murmurs, rubs, gallops RESPIRATORY:  Clear to auscultation without rales, wheezing or rhonchi  ABDOMEN: Soft, non-tender, non-distended EXTREMITIES:  No edema; No deformity    ASSESSMENT AND PLAN:   CARDIOMYOPATHY:    Her EF was improved in 2018.  This was related likely to pregnancy in 2011.  She has had no new symptoms and remains on beta-blocker and ACE inhibitor and I would continue these indefinitely.  ESSENTIAL HYPERTENSION:   Her blood pressure at target.  No change in therapy.  OBESITY, MORBID    overall she has lost about 120 pounds.     Follow up with me as needed  Signed, Lynwood Schilling, MD

## 2023-10-07 ENCOUNTER — Ambulatory Visit: Attending: Cardiology | Admitting: Cardiology

## 2023-10-07 ENCOUNTER — Encounter: Payer: Self-pay | Admitting: Cardiology

## 2023-10-07 VITALS — BP 125/83 | HR 90 | Ht 65.0 in | Wt 237.0 lb

## 2023-10-07 DIAGNOSIS — I420 Dilated cardiomyopathy: Secondary | ICD-10-CM | POA: Diagnosis not present

## 2023-10-07 DIAGNOSIS — I10 Essential (primary) hypertension: Secondary | ICD-10-CM | POA: Diagnosis present

## 2023-10-07 NOTE — Patient Instructions (Signed)

## 2023-10-28 ENCOUNTER — Ambulatory Visit: Admitting: Obstetrics
